# Patient Record
Sex: Female | Born: 1950
Health system: Southern US, Community
[De-identification: ages and names within clinical notes are randomized; demographics above are authoritative.]

## PROBLEM LIST (undated history)

## (undated) DIAGNOSIS — R918 Other nonspecific abnormal finding of lung field: Secondary | ICD-10-CM

## (undated) DIAGNOSIS — K589 Irritable bowel syndrome without diarrhea: Secondary | ICD-10-CM

## (undated) DIAGNOSIS — K219 Gastro-esophageal reflux disease without esophagitis: Secondary | ICD-10-CM

## (undated) DIAGNOSIS — J45909 Unspecified asthma, uncomplicated: Secondary | ICD-10-CM

## (undated) DIAGNOSIS — M8580 Other specified disorders of bone density and structure, unspecified site: Secondary | ICD-10-CM

## (undated) DIAGNOSIS — A310 Pulmonary mycobacterial infection: Secondary | ICD-10-CM

## (undated) DIAGNOSIS — Z862 Personal history of diseases of the blood and blood-forming organs and certain disorders involving the immune mechanism: Secondary | ICD-10-CM

## (undated) DIAGNOSIS — J302 Other seasonal allergic rhinitis: Secondary | ICD-10-CM

## (undated) DIAGNOSIS — J069 Acute upper respiratory infection, unspecified: Secondary | ICD-10-CM

## (undated) HISTORY — DX: Pulmonary mycobacterial infection: A31.0

## (undated) HISTORY — DX: Other specified disorders of bone density and structure, unspecified site: M85.80

## (undated) HISTORY — DX: Acute upper respiratory infection, unspecified: J06.9

## (undated) HISTORY — DX: Other seasonal allergic rhinitis: J30.2

## (undated) HISTORY — PX: BREAST EXCISIONAL BIOPSY: SUR124

## (undated) HISTORY — DX: Other nonspecific abnormal finding of lung field: R91.8

## (undated) HISTORY — DX: Irritable bowel syndrome, unspecified: K58.9

## (undated) HISTORY — DX: Personal history of diseases of the blood and blood-forming organs and certain disorders involving the immune mechanism: Z86.2

## (undated) HISTORY — DX: Gastro-esophageal reflux disease without esophagitis: K21.9

## (undated) HISTORY — DX: Unspecified asthma, uncomplicated: J45.909

---

## 1998-07-05 ENCOUNTER — Other Ambulatory Visit: Admission: RE | Admit: 1998-07-05 | Discharge: 1998-07-05 | Payer: Self-pay | Admitting: Obstetrics and Gynecology

## 1999-01-06 ENCOUNTER — Ambulatory Visit (HOSPITAL_COMMUNITY): Admission: RE | Admit: 1999-01-06 | Discharge: 1999-01-06 | Payer: Self-pay | Admitting: Internal Medicine

## 1999-07-09 ENCOUNTER — Other Ambulatory Visit: Admission: RE | Admit: 1999-07-09 | Discharge: 1999-07-09 | Payer: Self-pay | Admitting: Internal Medicine

## 1999-07-28 ENCOUNTER — Ambulatory Visit (HOSPITAL_COMMUNITY): Admission: RE | Admit: 1999-07-28 | Discharge: 1999-07-28 | Payer: Self-pay | Admitting: Internal Medicine

## 1999-07-28 ENCOUNTER — Encounter: Payer: Self-pay | Admitting: Internal Medicine

## 1999-07-31 ENCOUNTER — Ambulatory Visit (HOSPITAL_COMMUNITY): Admission: RE | Admit: 1999-07-31 | Discharge: 1999-07-31 | Payer: Self-pay | Admitting: Internal Medicine

## 1999-07-31 ENCOUNTER — Encounter: Payer: Self-pay | Admitting: Internal Medicine

## 1999-08-28 ENCOUNTER — Ambulatory Visit (HOSPITAL_COMMUNITY): Admission: RE | Admit: 1999-08-28 | Discharge: 1999-08-28 | Payer: Self-pay | Admitting: Gastroenterology

## 2000-09-09 ENCOUNTER — Other Ambulatory Visit: Admission: RE | Admit: 2000-09-09 | Discharge: 2000-09-09 | Payer: Self-pay | Admitting: *Deleted

## 2000-10-18 ENCOUNTER — Ambulatory Visit (HOSPITAL_COMMUNITY): Admission: RE | Admit: 2000-10-18 | Discharge: 2000-10-18 | Payer: Self-pay | Admitting: Internal Medicine

## 2000-10-18 ENCOUNTER — Encounter: Payer: Self-pay | Admitting: Internal Medicine

## 2000-10-20 ENCOUNTER — Encounter: Admission: RE | Admit: 2000-10-20 | Discharge: 2000-10-20 | Payer: Self-pay | Admitting: Internal Medicine

## 2000-10-20 ENCOUNTER — Encounter: Payer: Self-pay | Admitting: Internal Medicine

## 2002-01-24 ENCOUNTER — Encounter: Payer: Self-pay | Admitting: Internal Medicine

## 2002-01-24 ENCOUNTER — Ambulatory Visit (HOSPITAL_COMMUNITY): Admission: RE | Admit: 2002-01-24 | Discharge: 2002-01-24 | Payer: Self-pay | Admitting: Pediatrics

## 2003-02-21 ENCOUNTER — Ambulatory Visit (HOSPITAL_COMMUNITY): Admission: RE | Admit: 2003-02-21 | Discharge: 2003-02-21 | Payer: Self-pay | Admitting: Internal Medicine

## 2003-02-21 ENCOUNTER — Encounter: Payer: Self-pay | Admitting: Internal Medicine

## 2003-02-23 ENCOUNTER — Encounter: Admission: RE | Admit: 2003-02-23 | Discharge: 2003-02-23 | Payer: Self-pay | Admitting: Internal Medicine

## 2003-02-23 ENCOUNTER — Encounter: Payer: Self-pay | Admitting: Internal Medicine

## 2003-12-04 ENCOUNTER — Other Ambulatory Visit: Admission: RE | Admit: 2003-12-04 | Discharge: 2003-12-04 | Payer: Self-pay | Admitting: Obstetrics and Gynecology

## 2003-12-26 ENCOUNTER — Encounter: Admission: RE | Admit: 2003-12-26 | Discharge: 2003-12-26 | Payer: Self-pay | Admitting: Obstetrics and Gynecology

## 2004-08-27 ENCOUNTER — Encounter: Admission: RE | Admit: 2004-08-27 | Discharge: 2004-08-27 | Payer: Self-pay | Admitting: Obstetrics and Gynecology

## 2005-03-18 ENCOUNTER — Encounter: Admission: RE | Admit: 2005-03-18 | Discharge: 2005-03-18 | Payer: Self-pay | Admitting: Obstetrics and Gynecology

## 2005-08-28 ENCOUNTER — Encounter: Admission: RE | Admit: 2005-08-28 | Discharge: 2005-08-28 | Payer: Self-pay | Admitting: Obstetrics and Gynecology

## 2005-12-14 HISTORY — PX: CARPAL TUNNEL RELEASE: SHX101

## 2006-01-14 HISTORY — PX: BREAST SURGERY: SHX581

## 2006-03-24 ENCOUNTER — Encounter: Admission: RE | Admit: 2006-03-24 | Discharge: 2006-03-24 | Payer: Self-pay | Admitting: Obstetrics and Gynecology

## 2006-04-27 ENCOUNTER — Ambulatory Visit (HOSPITAL_COMMUNITY): Admission: RE | Admit: 2006-04-27 | Discharge: 2006-04-27 | Payer: Self-pay | Admitting: Gastroenterology

## 2006-10-12 ENCOUNTER — Encounter: Admission: RE | Admit: 2006-10-12 | Discharge: 2006-10-12 | Payer: Self-pay | Admitting: Internal Medicine

## 2007-04-18 ENCOUNTER — Encounter: Admission: RE | Admit: 2007-04-18 | Discharge: 2007-04-18 | Payer: Self-pay | Admitting: *Deleted

## 2007-04-26 ENCOUNTER — Encounter: Admission: RE | Admit: 2007-04-26 | Discharge: 2007-04-26 | Payer: Self-pay | Admitting: Obstetrics and Gynecology

## 2007-06-29 ENCOUNTER — Encounter
Admission: RE | Admit: 2007-06-29 | Discharge: 2007-06-29 | Payer: Self-pay | Admitting: Physical Medicine and Rehabilitation

## 2007-10-20 ENCOUNTER — Encounter: Admission: RE | Admit: 2007-10-20 | Discharge: 2007-10-20 | Payer: Self-pay | Admitting: Obstetrics and Gynecology

## 2007-10-28 ENCOUNTER — Ambulatory Visit: Payer: Self-pay | Admitting: Oncology

## 2007-11-03 ENCOUNTER — Encounter: Admission: RE | Admit: 2007-11-03 | Discharge: 2007-11-03 | Payer: Self-pay | Admitting: Obstetrics and Gynecology

## 2007-12-13 ENCOUNTER — Ambulatory Visit: Payer: Self-pay | Admitting: Oncology

## 2008-02-02 ENCOUNTER — Encounter: Admission: RE | Admit: 2008-02-02 | Discharge: 2008-02-02 | Payer: Self-pay | Admitting: Surgery

## 2008-02-03 ENCOUNTER — Encounter: Admission: RE | Admit: 2008-02-03 | Discharge: 2008-02-03 | Payer: Self-pay | Admitting: Surgery

## 2008-02-03 ENCOUNTER — Ambulatory Visit (HOSPITAL_BASED_OUTPATIENT_CLINIC_OR_DEPARTMENT_OTHER): Admission: RE | Admit: 2008-02-03 | Discharge: 2008-02-03 | Payer: Self-pay | Admitting: Surgery

## 2008-02-08 ENCOUNTER — Encounter: Admission: RE | Admit: 2008-02-08 | Discharge: 2008-02-08 | Payer: Self-pay | Admitting: Surgery

## 2008-03-02 ENCOUNTER — Encounter: Admission: RE | Admit: 2008-03-02 | Discharge: 2008-03-02 | Payer: Self-pay | Admitting: Family Medicine

## 2008-03-21 ENCOUNTER — Ambulatory Visit (HOSPITAL_COMMUNITY): Admission: RE | Admit: 2008-03-21 | Discharge: 2008-03-21 | Payer: Self-pay | Admitting: Neurosurgery

## 2008-05-16 ENCOUNTER — Encounter: Admission: RE | Admit: 2008-05-16 | Discharge: 2008-05-16 | Payer: Self-pay | Admitting: Family Medicine

## 2008-05-24 ENCOUNTER — Ambulatory Visit: Payer: Self-pay | Admitting: Internal Medicine

## 2008-05-24 DIAGNOSIS — J82 Pulmonary eosinophilia, not elsewhere classified: Secondary | ICD-10-CM

## 2008-05-24 DIAGNOSIS — J8289 Other pulmonary eosinophilia, not elsewhere classified: Secondary | ICD-10-CM | POA: Insufficient documentation

## 2008-05-24 DIAGNOSIS — R05 Cough: Secondary | ICD-10-CM | POA: Insufficient documentation

## 2008-05-24 DIAGNOSIS — R059 Cough, unspecified: Secondary | ICD-10-CM | POA: Insufficient documentation

## 2008-05-29 ENCOUNTER — Ambulatory Visit: Payer: Self-pay | Admitting: Internal Medicine

## 2008-05-29 ENCOUNTER — Ambulatory Visit: Admission: RE | Admit: 2008-05-29 | Discharge: 2008-05-29 | Payer: Self-pay | Admitting: Internal Medicine

## 2008-06-08 ENCOUNTER — Encounter (INDEPENDENT_AMBULATORY_CARE_PROVIDER_SITE_OTHER): Payer: Self-pay | Admitting: *Deleted

## 2008-06-19 ENCOUNTER — Ambulatory Visit: Payer: Self-pay | Admitting: Internal Medicine

## 2008-07-24 ENCOUNTER — Ambulatory Visit: Payer: Self-pay | Admitting: Internal Medicine

## 2008-09-03 IMAGING — CR DG CHEST 2V
2 series · 2 of 2 positions shown · non-contrast
Comparison: Chest x-ray 02/02/08 and CT 02/08/08.

CLINICAL DATA: Followup pneumonia.   Cough. 
 CHEST ? 2 VIEW:

[w chest pa]
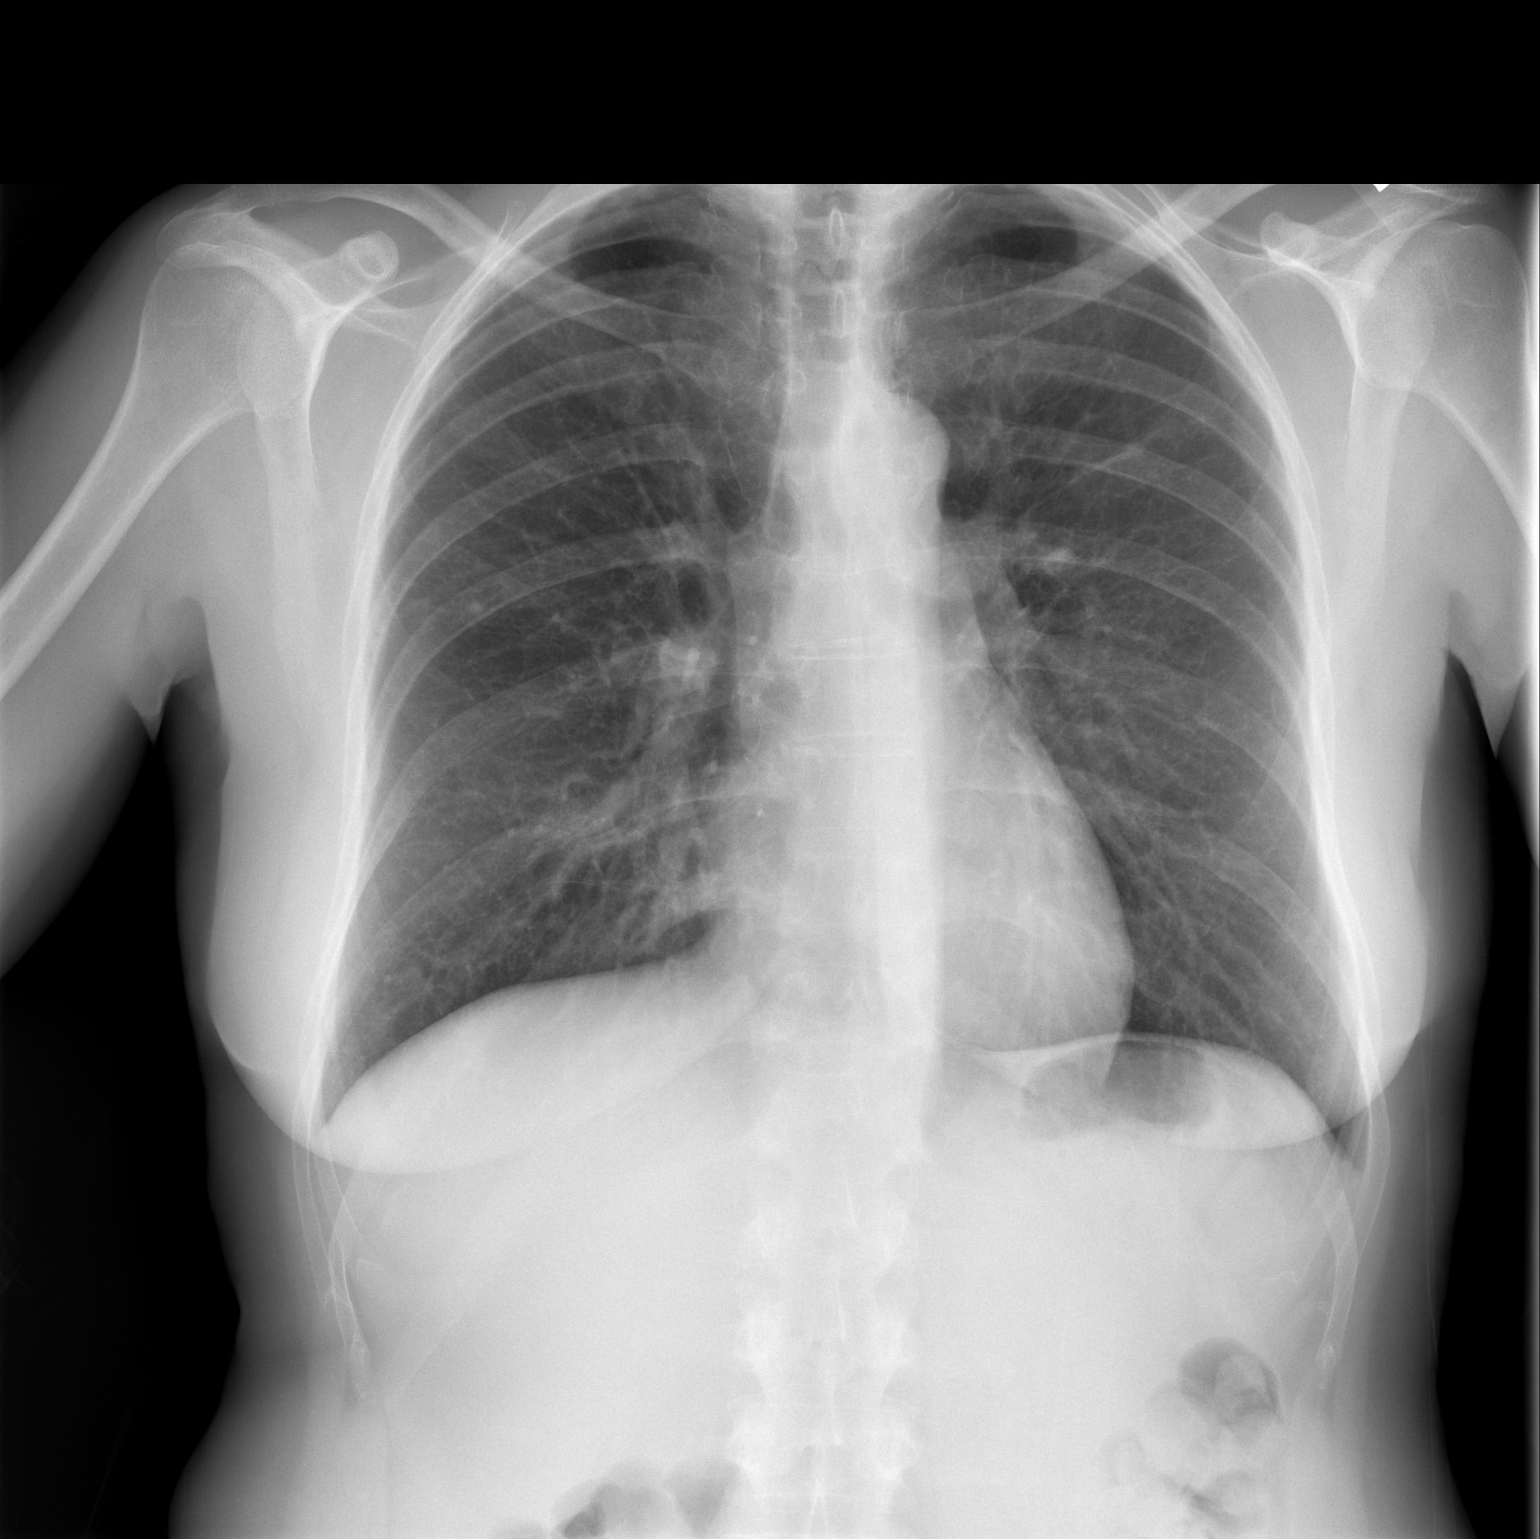

[w chest lat]
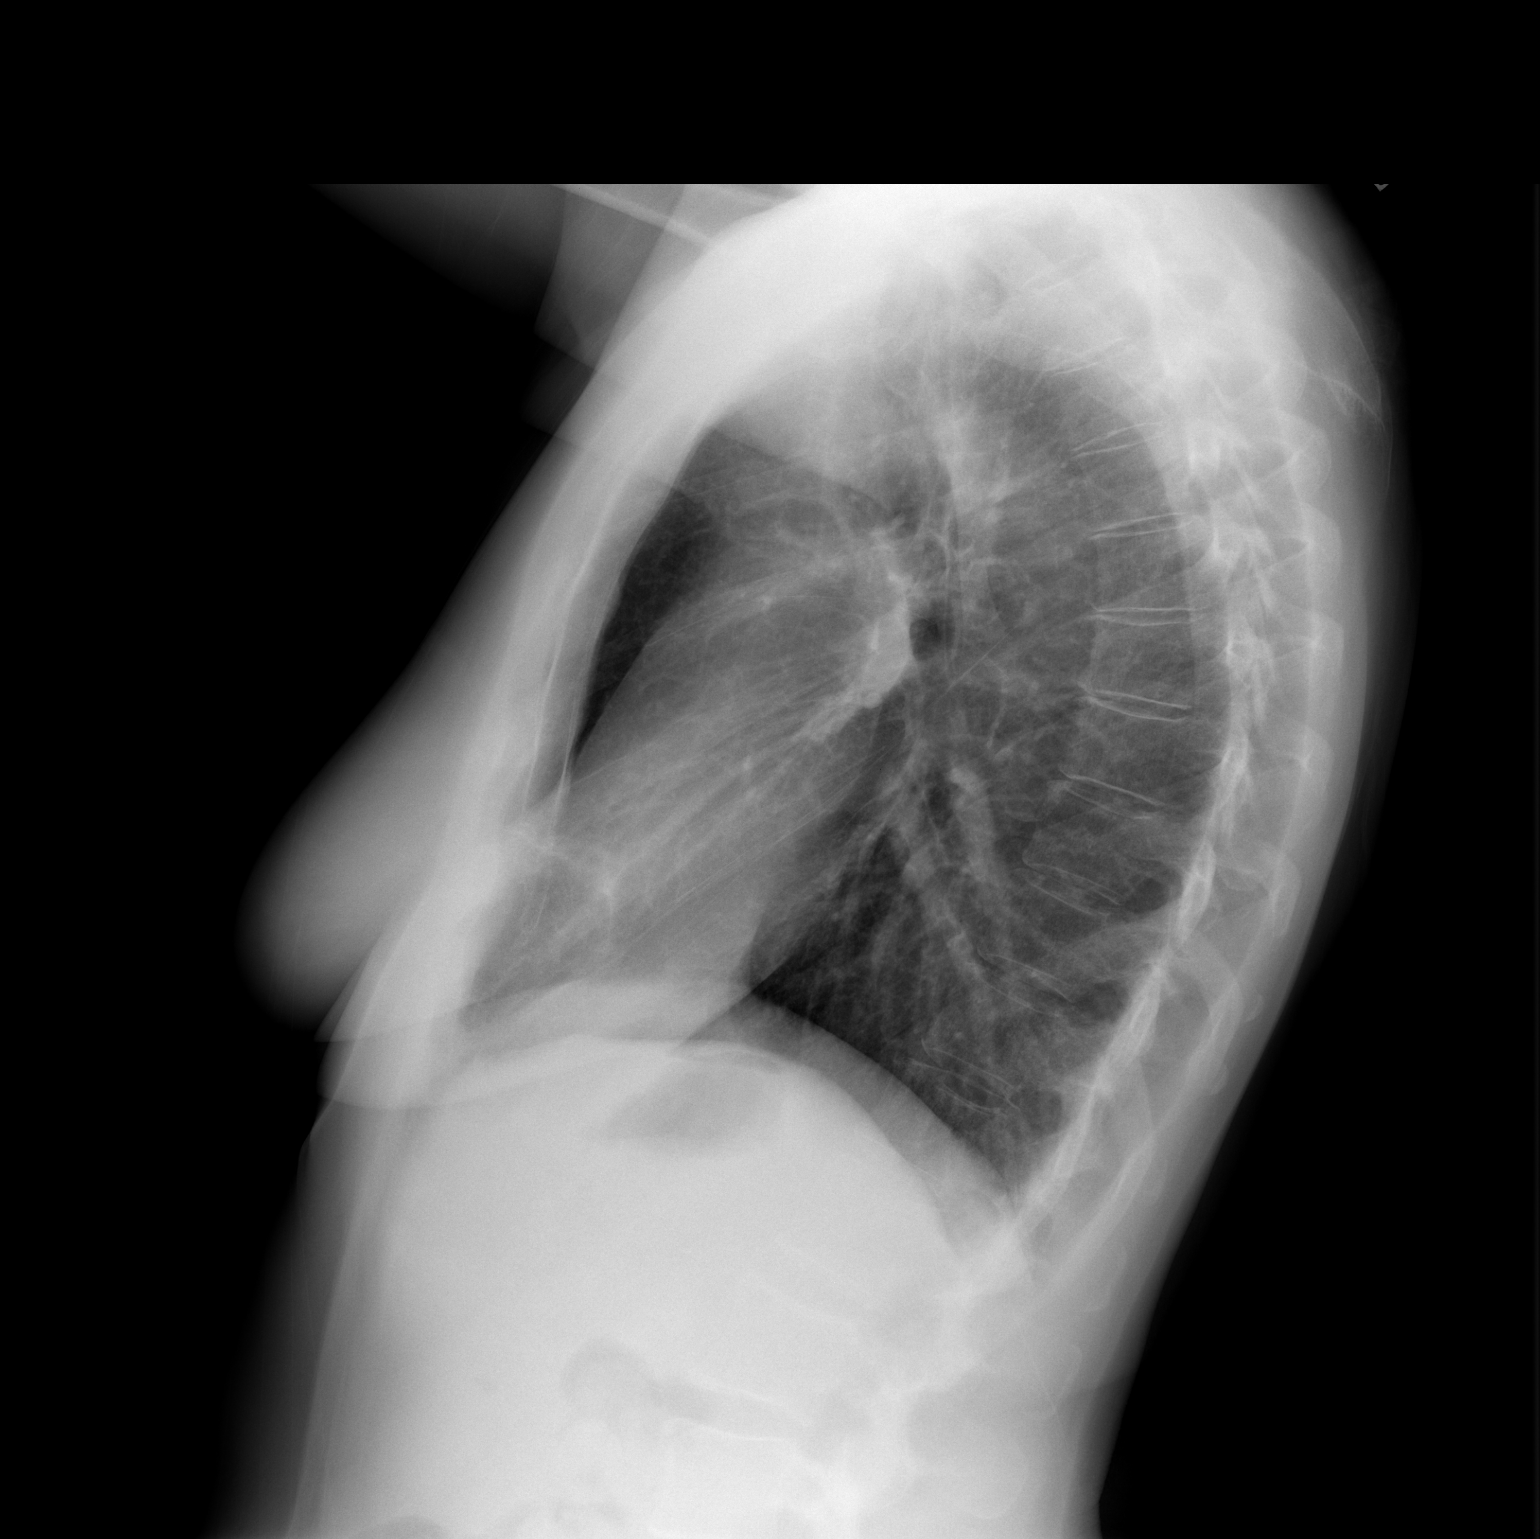

[2 of 2 positions shown; findings below may reference images not displayed]

FINDINGS: Patchy infiltrate in the right middle lobe is unchanged and has not yet resolved.  No new infiltrates are identified.  There is no effusion.  There is chronic lung disease.
IMPRESSION: Right middle lobe infiltrate is unchanged and is most compatible with pneumonia.  Continued followup until clearing is suggested.

## 2008-12-24 ENCOUNTER — Encounter: Admission: RE | Admit: 2008-12-24 | Discharge: 2008-12-24 | Payer: Self-pay | Admitting: Obstetrics and Gynecology

## 2008-12-27 ENCOUNTER — Encounter: Admission: RE | Admit: 2008-12-27 | Discharge: 2008-12-27 | Payer: Self-pay | Admitting: Obstetrics and Gynecology

## 2010-01-01 ENCOUNTER — Encounter: Admission: RE | Admit: 2010-01-01 | Discharge: 2010-01-01 | Payer: Self-pay | Admitting: Obstetrics and Gynecology

## 2010-01-06 ENCOUNTER — Encounter: Admission: RE | Admit: 2010-01-06 | Discharge: 2010-01-06 | Payer: Self-pay | Admitting: Obstetrics and Gynecology

## 2010-08-13 ENCOUNTER — Ambulatory Visit: Payer: Self-pay | Admitting: Internal Medicine

## 2010-11-11 ENCOUNTER — Encounter: Admission: RE | Admit: 2010-11-11 | Discharge: 2010-11-11 | Payer: Self-pay | Admitting: Obstetrics and Gynecology

## 2011-01-13 NOTE — Assessment & Plan Note (Signed)
Summary: Pulmonary/ acute ex ov for chronic cough   Copy to:  Sedonia Small NP Primary Provider/Referring Provider:  Dr. Shaune Pollack  CC:  Acute visit.  Pt c/o worsening cough over the past 6 months- gradually getting worse. Cough is non prod and gets worse at night when lies down and in the early am.  No other complaints today.Marland Kitchen  History of Present Illness: 35 yow quit smoking  age 60 after 14 years with a cough onset winter of 09 and a incidental  cxr done for pre op reasons   2/09   with worsening  rml process assoc with non prod cough  feels congestion worse in am  only after stirs.    Seen on 05/24/08  taking prilosec two times a day with persistent abn cxr so underwent fob on 05/29/08 with nl airways but BAL was 2+  and grew out MAI.   August 13, 2010  Acute visit.  Pt c/o worsening cough over the past 6 months- gradually getting worse. Cough is non prod and gets worse at night when lies down and in the early am.  No other complaints today. no folowing recs with ppi.  Pt denies any significant sore throat, dysphagia, itching, sneezing,  nasal congestion or excess secretions,  fever, chills, sweats, unintended wt loss, pleuritic or exertional cp, hempoptysis, change in activity tolerance  orthopnea pnd or leg swelling.  Pt also denies any obvious fluctuation in symptoms with weather or environmental change or other alleviating or aggravating factors.                  Current Medications (verified): 1)  Prilosec Otc 20 Mg  Tbec (Omeprazole Magnesium) .Marland Kitchen.. 1 Every Evening 2)  Claritin 10 Mg  Tabs (Loratadine) .... Once Daily As Needed 3)  Calcium Antacid Ultra Strength 1000 Mg Chew (Calcium Carbonate Antacid) .Marland Kitchen.. 1 Once Daily 4)  D 1000 Plus  Tabs (Fa-Cyanocobalamin-B6-D-Ca) .Marland Kitchen.. 1 Once Daily 5)  Vitamin C 500 Mg Tabs (Ascorbic Acid) .Marland Kitchen.. 1 Once Daily 6)  Aspirin Low Dose 81 Mg Tabs (Aspirin) .Marland Kitchen.. 1 Once Daily  Allergies (verified): 1)  ! Pcn  Past History:  Past Medical  History: GERD clinical dx Abn cxr      - FOB  05/29/08 nl with MAI 2+   Vital Signs:  Patient profile:   60 year old female Height:      65 inches Weight:      145.38 pounds BMI:     24.28 O2 Sat:      98 % on Room air Temp:     97.7 degrees F oral Pulse rate:   69 / minute BP sitting:   110 / 80  (left arm)  Vitals Entered By: Vernie Murders (August 13, 2010 3:36 PM)  O2 Flow:  Room air  Physical Exam  Additional Exam:  Ambulatory healthy appearing in no acute distress. 144 > 145  August 13, 2010  HEENT: nl dentition, turbinates, and orophanx. Nl external ear canals without cough reflex Neck without JVD/Nodes/TM Lungs clear to A and P bilaterally without cough on insp or exp maneuvers RRR no s3 or murmur or increase in P2 Abd soft and benign with nl excursion in the supine position. No bruits or organomegaly Ext warm without calf tenderness, cyanosis clubbing or edema Skin warm and dry without lesions     CXR  Procedure date:  08/13/2010  Findings:      Stable cardiopulmonary appearance with chronic changes noted in  the right midlung zone and medial aspect of the right lower lobe correlating with areas of bronchiectasis on CT from 2009.  No new focal or acute abnormality suggested.    Impression & Recommendations:  Problem # 1:  COUGH (ICD-786.2) The most common causes of chronic cough in immunocompetent adults include: upper airway cough syndrome (UACS), previously referred to as postnasal drip syndrome,  caused by variety of rhinosinus conditions; (2) asthma; (3) GERD; (4) chronic bronchitis from cigarette smoking or other inhaled environmental irritants; (5) nonasthmatic eosinophilic bronchitis; and (6) bronchiectasis. These conditions, singly or in combination, have accounted for up to 94% of the causes of chronic cough in prospective studies.  this is most c/w  Classic Upper airway cough syndrome, so named because it's frequently impossible to sort out how much  is  CR/sinusitis with freq throat clearing (which can be related to primary GERD)   vs  causing  secondary extra esophageal GERD from wide swings in gastric pressure that occur with throat clearing, promoting self use of mint and menthol lozenges that reduce the lower esophageal sphincter tone and exacerbate the problem further These are the same pts who not infrequently have failed to tolerate ace inhibitors,  dry powder inhalers or biphosphonates or report having reflux symptoms that don't respond to standard doses of PPI  for now rx as gerd with next step sinus cta  Problem # 2:  PULMONARY INFILTRATE INCLUDES (EOSINOPHILIA) (ICD-518.3) No change on cxr on pattern of cough which is not typical of MAI/ bronchiectasis  Medications Added to Medication List This Visit: 1)  Prilosec Otc 20 Mg Tbec (Omeprazole magnesium) .Marland Kitchen.. 1 every evening 2)  Prilosec Otc 20 Mg Tbec (Omeprazole magnesium) .... Take  one 30-60 min before first meal of the day 3)  Calcium Antacid Ultra Strength 1000 Mg Chew (Calcium carbonate antacid) .Marland Kitchen.. 1 once daily 4)  D 1000 Plus Tabs (Fa-cyanocobalamin-b6-d-ca) .Marland Kitchen.. 1 once daily 5)  Vitamin C 500 Mg Tabs (Ascorbic acid) .Marland Kitchen.. 1 once daily 6)  Aspirin Low Dose 81 Mg Tabs (Aspirin) .Marland Kitchen.. 1 once daily 7)  Pepcid Ac Maximum Strength 20 Mg Tabs (Famotidine) .... One at bedtime 8)  Delsym 30 Mg/25ml Lqcr (Dextromethorphan polistirex) .... 2 tsp every 12 hours as needed  Other Orders: Est. Patient Level IV (99214) T-2 View CXR (71020TC)  Patient Instructions: 1)  GERD (REFLUX)  is a common cause of respiratory symptoms. It commonly presents without heartburn and can be treated with medication, but also with lifestyle changes including avoidance of late meals, excessive alcohol, smoking cessation, and avoid fatty foods, chocolate, peppermint, colas, red wine, and acidic juices such as orange juice. NO MINT OR MENTHOL PRODUCTS SO NO COUGH DROPS  2)  USE SUGARLESS CANDY INSTEAD (jolley  ranchers)  3)  NO OIL BASED VITAMINS  4)  Prilosec 20 mg Take  one 30-60 min before first meal of the day and pepcid 20 mg one at bedtime 5)  Delsym as needed  6)  Call if not better Oct 1 for follow up 7)  NB the  ramp to expected improvement (and for that matter, worsening, if a chronic effective medication is stopped)  can be measured in weeks, not days, a common misconception because this is not Heartburn with no immediate cause and effect relationship so that response to therapy or lack thereof can be very difficult to assess.

## 2011-01-22 ENCOUNTER — Other Ambulatory Visit: Payer: Self-pay | Admitting: Family Medicine

## 2011-01-22 DIAGNOSIS — Z1231 Encounter for screening mammogram for malignant neoplasm of breast: Secondary | ICD-10-CM

## 2011-02-03 ENCOUNTER — Ambulatory Visit
Admission: RE | Admit: 2011-02-03 | Discharge: 2011-02-03 | Disposition: A | Discharge status: Home / Self Care | Payer: BC Managed Care – PPO | Source: Ambulatory Visit | Attending: Family Medicine | Admitting: Family Medicine

## 2011-02-03 DIAGNOSIS — Z1231 Encounter for screening mammogram for malignant neoplasm of breast: Secondary | ICD-10-CM

## 2011-04-28 NOTE — Op Note (Signed)
NAMEROSEMARIA, INABINET                ACCOUNT NO.:  [id number]   MEDICAL RECORD NO.:  [id number]          PATIENT TYPE:  AMB   LOCATION:  SDS                          FACILITY:  [facility]   PHYSICIAN:  Payton Doughty, M.D.      DATE OF BIRTH:  15-Nov-1951   DATE OF PROCEDURE:  03/21/2008  DATE OF DISCHARGE:  03/21/2008                               OPERATIVE REPORT   PREOPERATIVE DIAGNOSIS:  Right carpal tunnel syndrome.   POSTOPERATIVE DIAGNOSIS:  Right carpal tunnel syndrome.   OPERATIVE PROCEDURE:  Right carpal tunnel release.   SURGEON:  Payton Doughty, M.D.   ANESTHESIA:  Local lidocaine 1%.   COMPLICATIONS:  None.   ASSISTANT:  No assistant.    This is a 60 year old girl with right carpal tunnel syndrome, taken to  the operating room.  Following shave, prepped and draped in usual  sterile fashion and 1% lidocaine injected over the operative site  infiltrating the skin and subcutaneous tissues on the line between the  second and third rays on the hand.  This was done 1 cm distal to  proximal wrist crease.  A skin incision was made subcutaneous and palmar  fat was dissected and transverse carpal ligament identified and divided.  The nerves identified and the ligament was divided down on to the wrist  and up on to the hands, so that the nerve was completely free.  The  ligament was somewhat thickened and very tight.  The wound was irrigated  and hemostasis assured, and closed in a single layer of 3-0 nylon in an  interrupted vertical mattress fashion.  Betadine, Telfa dressing, and a  fluffy hand wrap applied.  The patient returned to recovery room in good  condition.            ______________________________  Payton Doughty, M.D.     MWR/MEDQ  D:  03/21/2008  T:  03/22/2008  Job:  [id number]

## 2011-04-28 NOTE — Op Note (Signed)
Kim Vasquez, Kim Vasquez                ACCOUNT NO.:  [id number]   MEDICAL RECORD NO.:  [id number]          PATIENT TYPE:  AMB   LOCATION:  DSC                          FACILITY:  [facility]   PHYSICIAN:  Thomas A. Cornett, M.D.DATE OF BIRTH:  07-12-1951   DATE OF PROCEDURE:  02/03/2008  DATE OF DISCHARGE:                               OPERATIVE REPORT   PREOPERATIVE DIAGNOSIS:  Left breast atypical ductal hyperplasia.   POSTOPERATIVE DIAGNOSIS:  Left breast atypical ductal hyperplasia.   PROCEDURE:  Left breast needle-localized excisional biopsy.   SURGEON:  Harriette Bouillon, M.D.   ANESTHESIA:  MAC with 2.25% Sensorcaine.   ESTIMATED BLOOD LOSS:  20 mL.   SPECIMEN:  Left breast tissue with previously placed clip from biopsy  and specimen verified by radiography with intact wire.   INDICATIONS FOR PROCEDURE:  The patient is a 60 year old postmenopausal  female found to have a cluster of left breast microcalcifications.  Biopsy proves this to be atypical ductal hyperplasia.  She has a very  strong family history of breast cancer, and we did a genetic workup  which was negative for BRCA 1 and 2 mutations.  She presents today for  left breast needle-localized excisional biopsy of this area.   DESCRIPTION OF PROCEDURE:  The patient was brought to the operating room  after undergoing left breast needle localization in the radiology suite.  After induction of MAC anesthesia, the left breast was prepped and  draped in a sterile fashion.  The wire was trimmed.  Local anesthesia  was injected consisting of 0.25% Sensorcaine with epinephrine in the  left upper outer quadrant laterally.  Curvilinear incision was made  around the wire.  Wire brought out the incision.  All the tissue around  the wire was excised in its entirety.  Specimen radiograph was done in  the operating room which showed the clip to be present as well as the  intact wire.  This was sent to pathology for further evaluation.   The  wound was irrigated.  Hemostasis was achieved, and this was closed in  layers using a deep layer of 3-0 Vicryl and 4-0 Monocryl for a  subcuticular stitch.  Dermabond was applied.  All final counts of  sponge, needle and instruments were found to be correct at this portion  of the case.  The patient was then awoke, taken to recovery in  satisfactory condition.     Thomas A. Cornett, M.D.  Electronically Signed    TAC/MEDQ  D:  02/03/2008  T:  02/04/2008  Job:  [id number]   cc:   Josephine Igo, M.D.

## 2011-04-28 NOTE — Op Note (Signed)
[name], HADSALL                ACCOUNT NO.:  [id number]   MEDICAL RECORD NO.:  [id number]          PATIENT TYPE:  AMB   LOCATION:  CARD                         FACILITY:  [facility]   PHYSICIAN:  Casimiro Needle B. Sherene Sires, MD, FCCPDATE OF BIRTH:  04-Jun-1951   DATE OF PROCEDURE:  05/29/2008  DATE OF DISCHARGE:                               OPERATIVE REPORT   PROCEDURE:  Fiberoptic bronchoscope with a lavage of the right middle  lobe.   The patient was referred by [facility].   HISTORY AND INDICATIONS:  Please see attached pulmonary consultation.  The patient has a persistent density in the right middle lobe which most  likely corresponds to a streaky area of atelectasis or scarring, but  bronchoscopy was indicated to rule out a right middle lobe syndrome and  to obtain sampling for cytology, AFB and fungal stain and culture.  The  patient agreed to procedure after a full discussion of the risks,  benefits and alternatives.   DESCRIPTION OF PROCEDURE:  The procedure was performed in the  bronchoscopy suite with continuous monitoring by surface ECG and  oximetry, and the patient maintained adequate saturations throughout the  procedure on nasal prongs.   She received a total of 5 mg of Versed and 50 gm of IV Demerol for  adequate sedation and cough suppression.   The left naris was easily cannulated using a standard flexible  Fiberoptic bronchoscope, then the entire upper airway was inspected.  The cords moved normally and there were no obvious upper airway lesions  to my review.  Using additional 1% lidocaine as needed, the entire  tracheobronchial tree was explored with the following findings.  The  trachea and all the major bronchi to the subsegmental level were normal  bilaterally, including the right middle lobe which was inspected twice.  There were no areas of focal abnormality.  Therefore, the medial segment  of the right middle lobe was lavaged vigorously with  adequate return and  sent for cytology, AFB and fungal stain and culture, as well as routine  stain and culture.   The patient tolerated the procedure well and follow-up will be arranged  as an outpatient.   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  No  evidence of endobronchial obstruction.  Serial follow-up will be  arranged as an outpatient along with evaluation of the above studies  when available.      [name]. Sherene Sires, MD, [facility]  Electronically Signed     MBW/MEDQ  D:  05/29/2008  T:  05/30/2008  Job:  [id number]   cc:   [facility]

## 2011-09-04 LAB — CBC
HCT: 37.7
Hemoglobin: 12.9
MCHC: 34.3
MCV: 90.4
Platelets: 275
RBC: 4.18
RDW: 11.8
WBC: 6

## 2011-09-04 LAB — DIFFERENTIAL
Basophils Absolute: 0
Basophils Relative: 0
Eosinophils Absolute: 0.1
Eosinophils Relative: 2
Lymphocytes Relative: 23
Lymphs Abs: 1.4
Monocytes Absolute: 0.4
Monocytes Relative: 7
Neutro Abs: 4.1
Neutrophils Relative %: 67

## 2011-09-04 LAB — BASIC METABOLIC PANEL
BUN: 13
CO2: 29
Calcium: 9.2
Chloride: 100
Creatinine, Ser: 0.87
GFR calc Af Amer: >60
GFR calc non Af Amer: >60
Glucose, Bld: 120 — ABNORMAL HIGH
Potassium: 4.9
Sodium: 137

## 2011-09-08 LAB — URINALYSIS, ROUTINE W REFLEX MICROSCOPIC
Bilirubin Urine: NEGATIVE
Glucose, UA: NEGATIVE
Hgb urine dipstick: NEGATIVE
Ketones, ur: NEGATIVE
Nitrite: NEGATIVE
Protein, ur: NEGATIVE
Specific Gravity, Urine: 1.009
Urobilinogen, UA: 0.2
pH: 7.5

## 2011-09-08 LAB — DIFFERENTIAL
Basophils Absolute: 0
Basophils Relative: 1
Eosinophils Absolute: 0.1
Eosinophils Relative: 1
Lymphocytes Relative: 27
Lymphs Abs: 1.5
Monocytes Absolute: 0.5
Monocytes Relative: 9
Neutro Abs: 3.6
Neutrophils Relative %: 62

## 2011-09-08 LAB — COMPREHENSIVE METABOLIC PANEL
ALT: 18
AST: 22
Albumin: 4.3
Alkaline Phosphatase: 59
BUN: 10
CO2: 32
Calcium: 9.7
Chloride: 97
Creatinine, Ser: 0.69
GFR calc Af Amer: >60
GFR calc non Af Amer: >60
Glucose, Bld: 85
Potassium: 4.3
Sodium: 136
Total Bilirubin: 0.9
Total Protein: 7

## 2011-09-08 LAB — APTT: aPTT: 31

## 2011-09-08 LAB — CBC
HCT: 38.2
Hemoglobin: 13.4
MCHC: 35.2
MCV: 90
Platelets: 282
RBC: 4.25
RDW: 11.6
WBC: 5.7

## 2011-09-08 LAB — PROTIME-INR
INR: 0.9
Prothrombin Time: 12.3

## 2011-09-10 LAB — CULTURE, RESPIRATORY

## 2011-09-10 LAB — AFB CULTURE WITH SMEAR (NOT AT ARMC)

## 2011-09-10 LAB — FUNGUS CULTURE W SMEAR: Fungal Smear: NONE SEEN

## 2011-09-10 LAB — CULTURE, RESPIRATORY W GRAM STAIN

## 2012-02-01 ENCOUNTER — Other Ambulatory Visit: Payer: Self-pay | Admitting: Family Medicine

## 2012-02-01 DIAGNOSIS — R102 Pelvic and perineal pain: Secondary | ICD-10-CM

## 2012-02-02 ENCOUNTER — Ambulatory Visit
Admission: RE | Admit: 2012-02-02 | Discharge: 2012-02-02 | Disposition: A | Discharge status: Home / Self Care | Payer: BC Managed Care – PPO | Source: Ambulatory Visit | Attending: Family Medicine | Admitting: Family Medicine

## 2012-02-02 DIAGNOSIS — R102 Pelvic and perineal pain: Secondary | ICD-10-CM

## 2012-02-25 ENCOUNTER — Other Ambulatory Visit: Payer: Self-pay | Admitting: Gastroenterology

## 2012-02-26 ENCOUNTER — Other Ambulatory Visit: Payer: Self-pay | Admitting: Obstetrics & Gynecology

## 2012-02-26 DIAGNOSIS — Z1231 Encounter for screening mammogram for malignant neoplasm of breast: Secondary | ICD-10-CM

## 2012-03-15 ENCOUNTER — Ambulatory Visit
Admission: RE | Admit: 2012-03-15 | Discharge: 2012-03-15 | Disposition: A | Discharge status: Home / Self Care | Payer: BC Managed Care – PPO | Source: Ambulatory Visit | Attending: Obstetrics & Gynecology | Admitting: Obstetrics & Gynecology

## 2012-03-15 DIAGNOSIS — Z1231 Encounter for screening mammogram for malignant neoplasm of breast: Secondary | ICD-10-CM

## 2012-03-17 ENCOUNTER — Other Ambulatory Visit: Payer: Self-pay | Admitting: Obstetrics & Gynecology

## 2012-03-17 DIAGNOSIS — R928 Other abnormal and inconclusive findings on diagnostic imaging of breast: Secondary | ICD-10-CM

## 2012-03-18 ENCOUNTER — Ambulatory Visit
Admission: RE | Admit: 2012-03-18 | Discharge: 2012-03-18 | Disposition: A | Discharge status: Home / Self Care | Payer: BC Managed Care – PPO | Source: Ambulatory Visit | Attending: Obstetrics & Gynecology | Admitting: Obstetrics & Gynecology

## 2012-03-18 DIAGNOSIS — R928 Other abnormal and inconclusive findings on diagnostic imaging of breast: Secondary | ICD-10-CM

## 2012-05-18 ENCOUNTER — Other Ambulatory Visit: Payer: Self-pay | Admitting: Obstetrics & Gynecology

## 2012-05-18 DIAGNOSIS — N6320 Unspecified lump in the left breast, unspecified quadrant: Secondary | ICD-10-CM

## 2012-05-25 ENCOUNTER — Ambulatory Visit
Admission: RE | Admit: 2012-05-25 | Discharge: 2012-05-25 | Disposition: A | Discharge status: Home / Self Care | Payer: BC Managed Care – PPO | Source: Ambulatory Visit | Attending: Obstetrics & Gynecology | Admitting: Obstetrics & Gynecology

## 2012-05-25 DIAGNOSIS — N6320 Unspecified lump in the left breast, unspecified quadrant: Secondary | ICD-10-CM

## 2012-12-19 ENCOUNTER — Ambulatory Visit (INDEPENDENT_AMBULATORY_CARE_PROVIDER_SITE_OTHER): Payer: BC Managed Care – PPO | Admitting: Pulmonary Disease

## 2012-12-19 ENCOUNTER — Ambulatory Visit (INDEPENDENT_AMBULATORY_CARE_PROVIDER_SITE_OTHER)
Admission: RE | Admit: 2012-12-19 | Discharge: 2012-12-19 | Disposition: A | Discharge status: Home / Self Care | Payer: BC Managed Care – PPO | Source: Ambulatory Visit | Attending: Pulmonary Disease | Admitting: Pulmonary Disease

## 2012-12-19 ENCOUNTER — Encounter: Payer: Self-pay | Admitting: *Deleted

## 2012-12-19 VITALS — BP 118/78 | HR 79 | Temp 97.3°F | Ht 64.5 in | Wt 137.0 lb

## 2012-12-19 DIAGNOSIS — R042 Hemoptysis: Secondary | ICD-10-CM

## 2012-12-19 DIAGNOSIS — A310 Pulmonary mycobacterial infection: Secondary | ICD-10-CM

## 2012-12-19 NOTE — Patient Instructions (Signed)
Will schedule CT chest and call with results Follow up in 4 weeks 

## 2012-12-19 NOTE — Progress Notes (Signed)
Chief Complaint  Patient presents with  . acute visit    saw MW 07/2010. pt c/o coughing up blood on 12/11/12. 1 tsp at a time-coughing fits at a time. has not coughed up any blood since. she has been taking delsym at bedtime and occasionally in the morning.     History of Present Illness: Kim Vasquez is a 62 y.o. female former smoker for evaluation of hemoptysis.  She was seen by Dr. Sherene Sires in 2009 and 2011 for pulmonary infiltrates.  She had bronchoscopy in 2009 which showed MAI, Penicillium, and Aspergillus [place].  She had episode of cough with blood on December 29.  She coughed up about 1/2 dixie cup of blood during the course of that day.  She has not had hemoptysis since.  She usually has occasional dry cough.  She gets occasional sinus congestion.  She denies recent nose bleeds.  She denies fever, sweats, chills, skin rash, weight loss, or gland swelling.  She has not had chest pain, wheeze, or dyspnea with exertion.  She denies recent sick exposures.  She quit smoking years ago.  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  She has not been on antibiotics or prednisone recently.  She has not had recent chest xray.  She is not using any inhalers.  She has been using delsym to help suppress her cough.  Tests: 02/08/08 CT chest >> RML tree in bud, mild BTX RML 05/29/08 Bronchoscopy >> scarring RML, cytology negative; MAI, Penicillium, and Aspergillus [place] in BAL  Past Medical History  Diagnosis Date  . GERD (gastroesophageal reflux disease)   . Cough   . Pulmonary infiltrate   . Seasonal allergies     Past Surgical History  Procedure Date  . Wrist surgery     No current outpatient prescriptions on file prior to visit.    Allergies  Allergen Reactions  . Bacitracin   . Erythromycin   . Neosporin (Neomycin-Bacitracin Zn-Polymyx)   . Penicillins   . Polysporin (Bacitracin-Polymyxin B)     Family History  Problem Relation Age of Onset  .  Lymphoma Mother   . Breast cancer Sister   . Colon cancer Maternal Aunt   . Heart disease      maternal grandparents    History  Substance Use Topics  . Smoking status: Former Smoker -- 1.0 packs/day for 14 years    Types: Cigarettes    Quit date: 12/14/1981  . Smokeless tobacco: Not on file  . Alcohol Use: Yes     Comment: 2 per day    Review of Systems  Constitutional: Negative for appetite change and unexpected weight change.  HENT: Positive for congestion and dental problem. Negative for ear pain, sore throat, sneezing and trouble swallowing.   Respiratory: Positive for cough and shortness of breath.   Cardiovascular: Negative for chest pain, palpitations and leg swelling.  Gastrointestinal: Negative for abdominal pain.  Musculoskeletal: Negative for joint swelling.  Skin: Negative for rash.  Neurological: Negative for headaches.  Psychiatric/Behavioral: Negative for dysphoric mood. The patient is not nervous/anxious.    Physical Exam: Filed Vitals:   12/19/12 1404 12/19/12 1406  BP:  118/78  Pulse:  79  Temp: 97.3 F (36.3 C)   TempSrc: Oral   Height: 5' 4.5" (1.638 m)   Weight: 137 lb (62.143 kg)   SpO2:  99%    Current Encounter SPO2  12/19/12 1406 99%  08/13/10 1522 98%  06/19/08 1616 99%  Wt Readings from Last 3 Encounters:  12/19/12 137 lb (62.143 kg)  08/13/10 145 lb 6.1 oz (65.945 kg)  06/19/08 144 lb (65.318 kg)    Body mass index is 23.15 kg/(m^2).   General - No distress ENT - No sinus tenderness, no oral exudate, no LAN, no thyromegaly, TM clear, pupils equal/reactive Cardiac - s1s2 regular, no murmur, pulses symmetric Chest - No wheeze/rales/dullness, good air entry, normal respiratory excursion Back - No focal tenderness Abd - Soft, non-tender, no organomegaly, + bowel sounds Ext - No edema Neuro - Normal strength, cranial nerves intact Skin - No rashes Psych - Normal mood, and behavior.   Lab Results  Component Value Date    WBC 5.7 03/15/2008   HGB 13.4 03/15/2008   HCT 38.2 03/15/2008   MCV 90.0 03/15/2008   PLT 282 03/15/2008    Lab Results  Component Value Date   CREATININE 0.69 03/15/2008   BUN 10 03/15/2008   NA 136 03/15/2008   K 4.3 03/15/2008   CL 97 03/15/2008   CO2 32 03/15/2008    Lab Results  Component Value Date   ALT 18 03/15/2008   AST 22 03/15/2008   ALKPHOS 59 03/15/2008   BILITOT 0.9 03/15/2008    Dg Chest 2 View  12/19/2012  *RADIOLOGY REPORT*  Clinical Data: Hemoptysis  CHEST - 2 VIEW  Comparison: Chest x-ray of 08/13/2010  Findings: No active infiltrate or effusion is seen.  Opacity in the right mid lung field on the frontal view appears stable compared to the prior chest x-ray and is consistent with scarring.  Mediastinal contours are stable.  The heart is within normal limits in size. No bony abnormality is seen.  IMPRESSION: Probable  scarring in the right mid lung possibly involving the right middle lobe.  No active process.   Original Report Authenticated By: Dwyane Dee, M.D.     Assessment/Plan:  Kim Helling, MD [place] [facility] Pager:  [phone] 12/19/2012, 3:08 PM

## 2012-12-19 NOTE — Assessment & Plan Note (Signed)
She had bronchoscopy from 2009 with MAI in BAL.  Will determine if this needs to be further assessed after review of her CT chest.

## 2012-12-19 NOTE — Assessment & Plan Note (Signed)
She has prior history of MAI.  It is uncertain how much effect this was having on her lung function.  She had one episode of hemoptysis in December.  She has not had recurrence.  Her chest xray today is re-assuring.  I think she needs to have a CT chest with contrast to better assess her lung parenchyma and mediastinum.  Depending on results will determine if additional interventions (ie repeat bronchoscopy) are needed.  She is to continue delsym as needed for cough suppression.  She does not have clinical or radiographic findings to suggest a bacterial infection, and will therefore defer antibiotics for now.

## 2012-12-19 NOTE — Progress Notes (Deleted)
  Subjective:    Patient ID: Kim Vasquez, female    DOB: 1951-11-08, 62 y.o.   MRN: [id number]  HPI    Review of Systems  Constitutional: Negative for appetite change and unexpected weight change.  HENT: Positive for congestion and dental problem. Negative for ear pain, sore throat, sneezing and trouble swallowing.   Respiratory: Positive for cough and shortness of breath.   Cardiovascular: Negative for chest pain, palpitations and leg swelling.  Gastrointestinal: Negative for abdominal pain.  Musculoskeletal: Negative for joint swelling.  Skin: Negative for rash.  Neurological: Negative for headaches.  Psychiatric/Behavioral: Negative for dysphoric mood. The patient is not nervous/anxious.        Objective:   Physical Exam        Assessment & Plan:

## 2012-12-22 ENCOUNTER — Inpatient Hospital Stay: Admission: RE | Admit: 2012-12-22 | Payer: BC Managed Care – PPO | Source: Ambulatory Visit

## 2012-12-22 ENCOUNTER — Ambulatory Visit (INDEPENDENT_AMBULATORY_CARE_PROVIDER_SITE_OTHER)
Admission: RE | Admit: 2012-12-22 | Discharge: 2012-12-22 | Disposition: A | Discharge status: Home / Self Care | Payer: BC Managed Care – PPO | Source: Ambulatory Visit | Attending: Pulmonary Disease | Admitting: Pulmonary Disease

## 2012-12-22 DIAGNOSIS — R042 Hemoptysis: Secondary | ICD-10-CM

## 2012-12-22 MED ORDER — IOHEXOL 300 MG/ML  SOLN
80.0000 mL | Freq: Once | INTRAMUSCULAR | Status: AC | PRN
  Administered 2012-12-22: 80 mL via INTRAVENOUS

## 2012-12-23 ENCOUNTER — Telehealth: Payer: Self-pay | Admitting: Pulmonary Disease

## 2012-12-23 NOTE — Telephone Encounter (Signed)
Ct Chest W Contrast  12/22/2012  *RADIOLOGY REPORT*   Clinical Data: Hemoptysis, history of MAI, chronic cough.   CT CHEST WITH CONTRAST   Technique:  Multidetector CT imaging of the chest was performed following the standard protocol during bolus administration of intravenous contrast.   Contrast: 80mL OMNIPAQUE IOHEXOL 300 MG/ML  SOLN   Comparison: Chest radiograph 12/19/2012, 02/08/2008 chest CT   Findings: Incidental hypodense right thyroid nodule is stable, likely benign.  Great vessels are normal in caliber.  No pericardial or pleural effusion.  Heart size is normal.  Great vessels are normal in caliber.  No lymphadenopathy.  Base of right upper lobe and right middle lobe patchy tree in bud type nodular airspace opacities with areas of bronchial wall thickening and distal inspissated secretions are again noted, increased.  No dominant pulmonary nodule, mass, or other consolidation.  Central airways are patent.  Incomplete imaging of the upper abdomen redemonstrates a normal appearance to visualized portions of the adrenal glands.  High-resolution CT images confirm the above findings.  No areas of air trapping or other evidence for interstitial lung disease.  T12 superior endplate Schmorl's node again noted.  No new osseous abnormality.   IMPRESSION: Predominately right upper and middle lobe tree in bud type nodular airspace opacities, bronchial wall thickening, and distal areas of inspissated secretions, consistent with small airways infection. Findings are increased since the prior exam 02/08/2008.    Original Report Authenticated By: Christiana Pellant, M.D.    Results d/w pt.  Have arranged for bronchoscopy on Wednesday 12/28/12 to be done at 9 am at [facility].  Procedure explained to pt.  Risks detailed as bleeding, infection, pneumothorax, and non-diagnosis.  Advised pt to be NPO after midnight, arrange for transportation to/from hospital, and present to [facility] 1 hour before scheduled procedure time.

## 2012-12-26 ENCOUNTER — Inpatient Hospital Stay: Admission: RE | Admit: 2012-12-26 | Payer: BC Managed Care – PPO | Source: Ambulatory Visit

## 2012-12-27 ENCOUNTER — Encounter (HOSPITAL_COMMUNITY): Payer: Self-pay

## 2012-12-28 ENCOUNTER — Encounter (HOSPITAL_COMMUNITY): Payer: Self-pay | Admitting: Radiology

## 2012-12-28 ENCOUNTER — Encounter (HOSPITAL_COMMUNITY)
Admission: RE | Disposition: A | Discharge status: Home / Self Care | Payer: Self-pay | Source: Ambulatory Visit | Attending: Pulmonary Disease

## 2012-12-28 ENCOUNTER — Ambulatory Visit (HOSPITAL_COMMUNITY)
Admission: RE | Admit: 2012-12-28 | Discharge: 2012-12-28 | Disposition: A | Discharge status: Home / Self Care | Payer: BC Managed Care – PPO | Source: Ambulatory Visit | Attending: Pulmonary Disease | Admitting: Pulmonary Disease

## 2012-12-28 DIAGNOSIS — K219 Gastro-esophageal reflux disease without esophagitis: Secondary | ICD-10-CM | POA: Insufficient documentation

## 2012-12-28 DIAGNOSIS — R042 Hemoptysis: Secondary | ICD-10-CM

## 2012-12-28 DIAGNOSIS — R0602 Shortness of breath: Secondary | ICD-10-CM | POA: Insufficient documentation

## 2012-12-28 DIAGNOSIS — Z87891 Personal history of nicotine dependence: Secondary | ICD-10-CM | POA: Insufficient documentation

## 2012-12-28 DIAGNOSIS — J3489 Other specified disorders of nose and nasal sinuses: Secondary | ICD-10-CM | POA: Insufficient documentation

## 2012-12-28 DIAGNOSIS — A310 Pulmonary mycobacterial infection: Secondary | ICD-10-CM

## 2012-12-28 DIAGNOSIS — J189 Pneumonia, unspecified organism: Secondary | ICD-10-CM | POA: Insufficient documentation

## 2012-12-28 HISTORY — PX: VIDEO BRONCHOSCOPY: SHX5072

## 2012-12-28 LAB — BODY FLUID CELL COUNT WITH DIFFERENTIAL
Eos, Fluid: 1 %
Eos, Fluid: 1 %
Lymphs, Fluid: 31 %
Lymphs, Fluid: 6 %
Monocyte-Macrophage-Serous Fluid: 7 % — ABNORMAL LOW (ref 50–90)
Monocyte-Macrophage-Serous Fluid: 9 % — ABNORMAL LOW (ref 50–90)
Neutrophil Count, Fluid: 59 % — ABNORMAL HIGH (ref 0–25)
Neutrophil Count, Fluid: 86 % — ABNORMAL HIGH (ref 0–25)
Total Nucleated Cell Count, Fluid: 1050 cu mm — ABNORMAL HIGH (ref 0–1000)
Total Nucleated Cell Count, Fluid: 41 cu mm (ref 0–1000)

## 2012-12-28 SURGERY — VIDEO BRONCHOSCOPY WITHOUT FLUORO
Anesthesia: Moderate Sedation | Laterality: Bilateral

## 2012-12-28 MED ORDER — LIDOCAINE HCL 1 % IJ SOLN
INTRAMUSCULAR | Status: DC | PRN
  Administered 2012-12-28: 6 mL via RESPIRATORY_TRACT

## 2012-12-28 MED ORDER — FENTANYL CITRATE 0.05 MG/ML IJ SOLN
INTRAMUSCULAR | Status: DC | PRN
  Administered 2012-12-28 (×2): 50 ug via INTRAVENOUS

## 2012-12-28 MED ORDER — MIDAZOLAM HCL 10 MG/2ML IJ SOLN
INTRAMUSCULAR | Status: AC
  Filled 2012-12-28: qty 4

## 2012-12-28 MED ORDER — MIDAZOLAM HCL 10 MG/2ML IJ SOLN
INTRAMUSCULAR | Status: DC | PRN
  Administered 2012-12-28 (×3): 2 mg via INTRAVENOUS

## 2012-12-28 MED ORDER — FENTANYL CITRATE 0.05 MG/ML IJ SOLN
INTRAMUSCULAR | Status: AC
  Filled 2012-12-28: qty 4

## 2012-12-28 NOTE — Procedures (Signed)
Bronchoscopy Procedure Note MYRLENE RIERA [id number] Jun 16, 1951  Procedure: Bronchoscopy Indications: Hemoptysis with history of bronchiectasis  Procedure Details Consent: Risks of procedure as well as the alternatives and risks of each were explained to the patient.  Consent for procedure obtained. Time Out: Verified patient identification, verified procedure, site/side was marked, verified correct patient position, special equipment/implants available, medications/allergies/relevent history reviewed, required imaging and test results available.  Performed  6 mg versed and 100 mcg fentanyl given for sedation and analgesia.  Initial attempt to pass bronchoscope orally, but patient had teeth clenched.  Then passed bronchoscope through right nares.  10 ml of 1% lidocaine instilled for topical anesthesia of airway.  Vocal cords visualized with normal motion.  Bronchoscope entered into trachea.  Carina visualized with normal mucosa.  Bronchoscope entered into Lt main bronchus.  Lt upper, lingular, and lower lobe orifices visualized mucosa normal w/o endobronchial lesions or evidence for bleeding.  Bronchoscope entered into Rt main bronchus.  Rt upper, middle, and lower lobe orifices were all visualized.  Mucosa normal, no endobronchial lesions, and no evidence of bleeding.  She did have mucoid secretions from RML > RUL.  60 ml saline instilled into RUL with 20 ml white cloudy fluid.  60 ml saline instilled to RML with 20 ml white cloudy fluid.  Evaluation Hemodynamic Status: BP stable throughout; O2 sats: stable throughout Patient's Current Condition: stable Specimens:  BAL RUL and RML for gram stain and culture, AFB, fungal culture, cytology, and cell count. Complications: No apparent complications Patient did tolerate procedure well.   Coralyn Helling, MD [facility] Pulmonary/Critical Care 12/28/2012, 9:37 AM Pager:  [phone] After 3pm call: [phone]

## 2012-12-28 NOTE — H&P (Signed)
Chief Complaint   Patient presents with   .  acute visit     saw MW 07/2010. pt c/o coughing up blood on 12/11/12. 1 tsp at a time-coughing fits at a time. has not coughed up any blood since. she has been taking delsym at bedtime and occasionally in the morning.    History of Present Illness:  Kim Vasquez is a 62 y.o. female former smoker for evaluation of hemoptysis.   She was seen by Dr. Sherene Sires in 2009 and 2011 for pulmonary infiltrates. She had bronchoscopy in 2009 which showed MAI, Penicillium, and Aspergillus Luxem[place].   She had episode of cough with blood on December 29. She coughed up about 1/2 dixie cup of blood during the course of that day. She has not had hemoptysis since.   She usually has occasional dry cough. She gets occasional sinus congestion. She denies recent nose bleeds. She denies fever, sweats, chills, skin rash, weight loss, or gland swelling. She has not had chest pain, wheeze, or dyspnea with exertion.   She denies recent sick exposures. She quit smoking years ago. 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   She has not been on antibiotics or prednisone recently. She has not had recent chest xray. She is not using any inhalers. She has been using delsym to help suppress her cough.    Tests:  02/08/08 CT chest >> RML tree in bud, mild BTX RML  05/29/08 Bronchoscopy >> scarring RML, cytology negative; MAI, Penicillium, and Aspergillus [place] in BAL  12/19/12 CT chest >> RUL and RML patchy tree in bud nodularity, BTX  Past Medical History   Diagnosis  Date   .  GERD (gastroesophageal reflux disease)    .  Cough    .  Pulmonary infiltrate    .  Seasonal allergies      Past Surgical History   Procedure  Date   .  Wrist surgery      No current outpatient prescriptions on file prior to visit.     Allergies   Allergen  Reactions   .  Bacitracin    .  Erythromycin    .  Neosporin (Neomycin-Bacitracin Zn-Polymyx)    .  Penicillins    .   Polysporin (Bacitracin-Polymyxin B)      Family History   Problem  Relation  Age of Onset   .  Lymphoma  Mother    .  Breast cancer  Sister    .  Colon cancer  Maternal Aunt    .  Heart disease        maternal grandparents     History   Substance Use Topics   .  Smoking status:  Former Smoker -- 1.0 packs/day for 14 years     Types:  Cigarettes     Quit date:  12/14/1981   .  Smokeless tobacco:  Not on file   .  Alcohol Use:  Yes      Comment: 2 per day     Review of Systems  Constitutional: Negative for appetite change and unexpected weight change.  HENT: Positive for congestion and dental problem. Negative for ear pain, sore throat, sneezing and trouble swallowing.  Respiratory: Positive for cough and shortness of breath.  Cardiovascular: Negative for chest pain, palpitations and leg swelling.  Gastrointestinal: Negative for abdominal pain.  Musculoskeletal: Negative for joint swelling.  Skin: Negative for rash.  Neurological: Negative for headaches.  Psychiatric/Behavioral: Negative for  dysphoric mood. The patient is not nervous/anxious.    Physical Exam:  Filed Vitals:    12/19/12 1404  12/19/12 1406   BP:   118/78   Pulse:   79   Temp:  97.3 F (36.3 C)    TempSrc:  Oral    Height:  5' 4.5" (1.638 m)    Weight:  137 lb (62.143 kg)    SpO2:   99%     Current Encounter SPO2   12/19/12  1406  99%   08/13/10  1522  98%   06/19/08  1616  99%     Wt Readings from Last 3 Encounters:   12/19/12  137 lb (62.143 kg)   08/13/10  145 lb 6.1 oz (65.945 kg)   06/19/08  144 lb (65.318 kg)     Body mass index is 23.15 kg/(m^2).  General - No distress  ENT - No sinus tenderness, no oral exudate, no LAN, no thyromegaly, TM clear, pupils equal/reactive  Cardiac - s1s2 regular, no murmur, pulses symmetric  Chest - No wheeze/rales/dullness, good air entry, normal respiratory excursion  Back - No focal tenderness  Abd - Soft, non-tender, no organomegaly, + bowel  sounds  Ext - No edema  Neuro - Normal strength, cranial nerves intact  Skin - No rashes  Psych - Normal mood, and behavior.    Lab Results   Component  Value  Date    WBC  5.7  03/15/2008    HGB  13.4  03/15/2008    HCT  38.2  03/15/2008    MCV  90.0  03/15/2008    PLT  282  03/15/2008     Lab Results   Component  Value  Date    CREATININE  0.69  03/15/2008    BUN  10  03/15/2008    NA  136  03/15/2008    K  4.3  03/15/2008    CL  97  03/15/2008    CO2  32  03/15/2008     Lab Results   Component  Value  Date    ALT  18  03/15/2008    AST  22  03/15/2008    ALKPHOS  59  03/15/2008    BILITOT  0.9  03/15/2008     Dg Chest 2 View  12/19/2012 *RADIOLOGY REPORT* Clinical Data: Hemoptysis CHEST - 2 VIEW Comparison: Chest x-ray of 08/13/2010 Findings: No active infiltrate or effusion is seen. Opacity in the right mid lung field on the frontal view appears stable compared to the prior chest x-ray and is consistent with scarring. Mediastinal contours are stable. The heart is within normal limits in size. No bony abnormality is seen. IMPRESSION: Probable scarring in the right mid lung possibly involving the right middle lobe. No active process. Original Report Authenticated By: Dwyane Dee, M.D.   Dg Chest 2 View  12/19/2012  *RADIOLOGY REPORT*  Clinical Data: Hemoptysis  CHEST - 2 VIEW  Comparison: Chest x-ray of 08/13/2010  Findings: No active infiltrate or effusion is seen.  Opacity in the right mid lung field on the frontal view appears stable compared to the prior chest x-ray and is consistent with scarring.  Mediastinal contours are stable.  The heart is within normal limits in size. No bony abnormality is seen.  IMPRESSION: Probable  scarring in the right mid lung possibly involving the right middle lobe.  No active process.   Original Report Authenticated By: Dwyane Dee, M.D.     Ct  Chest W Contrast  12/22/2012  *RADIOLOGY REPORT*   Clinical Data: Hemoptysis, history of MAI, chronic cough.  CT CHEST WITH  CONTRAST   Technique:  Multidetector CT imaging of the chest was performed following the standard protocol during bolus administration of intravenous contrast.   Contrast: 80mL OMNIPAQUE IOHEXOL 300 MG/ML  SOLN Comparison: Chest radiograph 12/19/2012, 02/08/2008 chest CT   Findings: Incidental hypodense right thyroid nodule is stable, likely benign.  Great vessels are normal in caliber.  No pericardial or pleural effusion.  Heart size is normal.  Great vessels are normal in caliber.  No lymphadenopathy.  Base of right upper lobe and right middle lobe patchy tree in bud type nodular airspace opacities with areas of bronchial wall thickening and distal inspissated secretions are again noted, increased.  No dominant pulmonary nodule, mass, or other consolidation.  Central airways are patent.  Incomplete imaging of the upper abdomen redemonstrates a normal appearance to visualized portions of the adrenal glands.  High-resolution CT images confirm the above findings.  No areas of air trapping or other evidence for interstitial lung disease.  T12 superior endplate Schmorl's node again noted.  No new osseous abnormality.   IMPRESSION: Predominately right upper and middle lobe tree in bud type nodular airspace opacities, bronchial wall thickening, and distal areas of inspissated secretions, consistent with small airways infection. Findings are increased since the prior exam 02/08/2008.    Original Report Authenticated By: Christiana Pellant, M.D.      Assessment/Plan:   Hemoptysis in setting of bronchiectasis with prior history of MAI.  She has prior, remote history of smoking. She has progression of her BTX on current CT chest. Plan: Will need bronchoscopy for airway inspection and sampling to further assess for recurrent/chronic respiratory infection  Bronchoscopy procedure explained to patient.  Risks detailed as bleeding, infection, pneumothorax, and non-diagnosis.  Coralyn Helling, MD [facility]  Pulmonary/Critical Care 12/28/2012, 8:18 AM Pager:  [phone] After 3pm call: [phone]

## 2012-12-28 NOTE — Progress Notes (Signed)
Bronch w/ video intervention performed, bronchial washing x2 intervention performed.

## 2012-12-28 NOTE — Progress Notes (Signed)
Coralyn Helling, MD [facility] Pulmonary/Critical Care 12/28/2012, 9:51 AM Pager:  [phone] After 3pm call: [phone]

## 2012-12-29 ENCOUNTER — Telehealth: Payer: Self-pay | Admitting: *Deleted

## 2012-12-29 ENCOUNTER — Institutional Professional Consult (permissible substitution): Payer: BC Managed Care – PPO | Admitting: Pulmonary Disease

## 2012-12-29 ENCOUNTER — Encounter (HOSPITAL_COMMUNITY): Payer: Self-pay | Admitting: Pulmonary Disease

## 2012-12-29 NOTE — Telephone Encounter (Signed)
Gloria from [facility] called with report that culture is growing AFB+1. I advised I will forward to Dr. Craige Cotta. Carron Curie, CMA

## 2012-12-30 ENCOUNTER — Telehealth: Payer: Self-pay | Admitting: Pulmonary Disease

## 2012-12-30 NOTE — Telephone Encounter (Signed)
Spoke with Diane and notified of recs per VS She verbalized understanding and states nothing further needed

## 2012-12-30 NOTE — Telephone Encounter (Signed)
Called, spoke with Sedalia Muta, RN with the [facility].  States she received a call from her lead case manager to f/u regarding pt's positive AFB Smear.  Diane would like to know: 1.  If Dr. Craige Cotta thinks this is TB or if pt has a hx of atypical mycobacerium 2.  If Dr. Craige Cotta would like the Health Dept to f/u with pt. Dr. Craige Cotta, pls advise.  Thank you.

## 2012-12-30 NOTE — Telephone Encounter (Signed)
BAL results from 12/28/12 >> cytology negative, AFB positive >> species ID pending.  Discussed preliminary results with Pt.  Likely MAI.  Unlikely to be M tuberculosis.  Will call when final species identified, and then determine if ID evaluation is needed.

## 2012-12-30 NOTE — Telephone Encounter (Signed)
Please inform health department AFB likely from atypical mycobacterium and not from M. Tuberculosis.  No need for health department follow up at this time.

## 2012-12-31 NOTE — Telephone Encounter (Signed)
Results d/w pt 12/30/12.

## 2013-01-10 ENCOUNTER — Telehealth: Payer: Self-pay | Admitting: Pulmonary Disease

## 2013-01-10 DIAGNOSIS — A310 Pulmonary mycobacterial infection: Secondary | ICD-10-CM

## 2013-01-10 DIAGNOSIS — J479 Bronchiectasis, uncomplicated: Secondary | ICD-10-CM

## 2013-01-10 NOTE — Telephone Encounter (Signed)
AFB CULTURE WITH SMEAR     Status: Normal (Preliminary result)   Collection Time   12/28/12  9:30 AM      Component Value Range Status Comment   Specimen Description BRONCHIAL ALVEOLAR LAVAGE RT MIDDLE LOBE   Final    Special Requests Normal   Final    ACID FAST SMEAR     Final    Value: 1+ ACID FAST BACILLI SEEN CRITICAL RESULT CALLED TO, READ BACK BY AND VERIFIED WITH: JENNIFER 17:05 12/29/12 FUENG CRITICAL RESULT CALLED TO, READ BACK BY AND VERIFIED WITH: SUSAN CARDWELL 13:10 12/30/12 FUENG   Culture     Final    Value: MYCOBACTERIUM AVIUM-INTRACELLULARE COMPLEX     Note: CRITICAL RESULT CALLED TO, READ BACK BY AND VERIFIED WITH: KATIE RN @ 9:17A ON 01/10/13 BY WILSN   Report Status PENDING   Incomplete      Results d/w pt.  She has recent episode of hemoptysis and progression of RUL and RML BTX.  Will refer to ID to further assess therapy options for MAI.

## 2013-01-18 ENCOUNTER — Ambulatory Visit (INDEPENDENT_AMBULATORY_CARE_PROVIDER_SITE_OTHER): Payer: BC Managed Care – PPO | Admitting: Infectious Diseases

## 2013-01-18 ENCOUNTER — Encounter: Payer: Self-pay | Admitting: Infectious Diseases

## 2013-01-18 VITALS — BP 144/87 | HR 80 | Temp 97.6°F | Ht 64.5 in | Wt 137.0 lb

## 2013-01-18 DIAGNOSIS — A310 Pulmonary mycobacterial infection: Secondary | ICD-10-CM

## 2013-01-18 NOTE — Assessment & Plan Note (Signed)
Dr. Ander Gaster and I spoke at length about therapy for this. Most concerning are her worsening findings on her CT scan. Clinically however she is unchanged. We spoke about the difficulties of the medications (liver toxicity, nausea) and the duration of therapy (1 year). Also, that there is no guarantee of cure with the therapy. I explained to her that typically patients who are treated have some symptomatic improvement over the year of therapy, however there is a significant relapse rate.  We discussed the plan and agreed that she will return in one year for a repeat CT scan of the chest. I asked her to let me know if she had any symptoms which would consider him warning signs: Fever chills, unexplained weight loss, worsening exercise tolerance, increasing productive cough or hemoptysis.

## 2013-01-18 NOTE — Progress Notes (Signed)
  Subjective:    Patient ID: Kim Vasquez, female    DOB: 1951/10/05, 62 y.o.   MRN: [id number]  HPI 62 year old female history of a CT scan in 2009 showing tree in bud appearance. She underwent a BAL which showed Aspergillus [place], Penicillium, and MAI.Was not treated at that time. She returns now after an episode of hemoptysis in January 2014. He went CT scan on January 9 showing: Predominately right upper and middle lobe tree in bud type nodular airspace opacities, bronchial wall thickening, and distal areas of  inspissated secretions, consistent with small airways infection. Findings are increased since the prior exam 02/08/2008.  She has undergone repeat December 28, 2012 bronchoscopy the culture growing MAI. Feels "ok", mild DOE, has chronic cough. Over last 4 years___ No further hemoptysis. No fever or chills. Weight has been fairly constant for last year. Lost weight 1 yr prior as she transitioned off diary.  Has not smoked in 30 years.   Review of Systems     Objective:   Physical Exam  Constitutional: She appears well-developed and well-nourished.  HENT:  Mouth/Throat: No oropharyngeal exudate.  Eyes: EOM are normal. Pupils are equal, round, and reactive to light.  Neck: Neck supple.  Cardiovascular: Normal rate, regular rhythm and normal heart sounds.   Pulmonary/Chest: Effort normal and breath sounds normal.  Abdominal: Soft. Bowel sounds are normal. There is no tenderness.  Lymphadenopathy:    She has no cervical adenopathy.    She has no axillary adenopathy.          Assessment & Plan:

## 2013-01-23 ENCOUNTER — Ambulatory Visit: Payer: BC Managed Care – PPO | Admitting: Pulmonary Disease

## 2013-01-23 ENCOUNTER — Encounter: Payer: Self-pay | Admitting: Pulmonary Disease

## 2013-01-23 ENCOUNTER — Ambulatory Visit (INDEPENDENT_AMBULATORY_CARE_PROVIDER_SITE_OTHER): Payer: BC Managed Care – PPO | Admitting: Pulmonary Disease

## 2013-01-23 VITALS — BP 120/76 | HR 75 | Temp 97.6°F | Ht 64.2 in | Wt 138.2 lb

## 2013-01-23 DIAGNOSIS — J479 Bronchiectasis, uncomplicated: Secondary | ICD-10-CM

## 2013-01-23 DIAGNOSIS — A310 Pulmonary mycobacterial infection: Secondary | ICD-10-CM

## 2013-01-23 DIAGNOSIS — Z23 Encounter for immunization: Secondary | ICD-10-CM

## 2013-01-23 DIAGNOSIS — R05 Cough: Secondary | ICD-10-CM

## 2013-01-23 DIAGNOSIS — R059 Cough, unspecified: Secondary | ICD-10-CM

## 2013-01-23 DIAGNOSIS — R053 Chronic cough: Secondary | ICD-10-CM | POA: Insufficient documentation

## 2013-01-23 MED ORDER — ALBUTEROL SULFATE HFA 108 (90 BASE) MCG/ACT IN AERS: 2.0000 | INHALATION_SPRAY | Freq: Four times a day (QID) | RESPIRATORY_TRACT | Status: DC | PRN

## 2013-01-23 NOTE — Assessment & Plan Note (Signed)
She has prior history of smoking.  She has changes of BTX on CT chest and sputum positive for MAI.  She reports symptoms of post-nasal drip.  She has persistent dry cough.  She had previously been on PPI therapy, but had no change in her cough.  Will arrange for PFT's to further assess.  Will give her trial of proair.  She is to continue nasal irrigation and benadryl.    Further assessment will be based on response to proair and results for PFT.

## 2013-01-23 NOTE — Progress Notes (Signed)
Chief Complaint  Patient presents with  . Follow-up    Pt saw Dr. Ninetta Lights 01/18/13. Pt states she has not coughed up any blood. Pt would like to discuss her OV with Dr. Ninetta Lights. Pt would like flu shot today.     History of Present Illness: LILLAR BIANCA is a 62 y.o. female former smoker with chronic cough, localized BTX, and MAI.  She was seen by ID.  Decision was made for clinical and radiographic monitoring off therapy.  She has persistent dry cough.  She denies wheezing, or chest tightness.  She takes benadryl at night at this helps.  She tries using claritin during the day, but this isn't as helpful.  She tried flonase before, but this gave her headaches.  She was on PPI therapy for possible reflux, but had no difference with her cough.  She has not had any recurrence of hemoptysis.  She denies fever, chills, sweats, gland swelling, or dyspnea on exertion.  Tests: 02/08/08 CT chest >> RML tree in bud, mild BTX RML 05/29/08 Bronchoscopy >> scarring RML, cytology negative; MAI, Penicillium, and Aspergillus [place] in BAL 12/22/12 CT chest >> RUL and RML tree in bud, BTX RML 12/28/12 Bronchoscopy >> MAI in BAL, cytology negative  Past Medical History  Diagnosis Date  . GERD (gastroesophageal reflux disease)   . Cough   . Pulmonary infiltrate   . Seasonal allergies     Past Surgical History  Procedure Laterality Date  . Wrist surgery    . Video bronchoscopy  12/28/2012    Procedure: VIDEO BRONCHOSCOPY WITHOUT FLUORO;  Surgeon: Coralyn Helling, MD;  Location: [facility];  Service: Cardiopulmonary;  Laterality: Bilateral;  . Breast surgery      Current Outpatient Prescriptions on File Prior to Visit  Medication Sig Dispense Refill  . Cholecalciferol (VITAMIN D PO) Take by mouth daily.      Marland Kitchen dextromethorphan (DELSYM) 30 MG/5ML liquid Take 60 mg by mouth as needed.      Marland Kitchen MAGNESIUM PO Take by mouth daily.      . Multiple Vitamin (MULTIVITAMIN) tablet Take 1 tablet by mouth daily.       . vitamin C (ASCORBIC ACID) 500 MG tablet Take 500 mg by mouth daily.       No current facility-administered medications on file prior to visit.    Allergies  Allergen Reactions  . Bacitracin   . Erythromycin   . Neosporin (Neomycin-Bacitracin Zn-Polymyx)   . Penicillins   . Polysporin (Bacitracin-Polymyxin B)     Physical Exam: Filed Vitals:   01/23/13 1337 01/23/13 1339  BP:  120/76  Pulse:  75  Temp: 97.6 F (36.4 C)   TempSrc: Oral   Height: 5' 4.2" (1.631 m)   Weight: 138 lb 3.2 oz (62.687 kg)   SpO2:  100%    Wt Readings from Last 3 Encounters:  01/23/13 138 lb 3.2 oz (62.687 kg)  01/18/13 137 lb (62.143 kg)  12/19/12 137 lb (62.143 kg)    Body mass index is 23.57 kg/(m^2).   General - No distress ENT - No sinus tenderness, no oral exudate, no LAN Cardiac - s1s2 regular, no murmur Chest - No wheeze/rales/dullness, good air entry, normal respiratory excursion Back - No focal tenderness Abd - Soft, non-tender Ext - No edema Neuro - Normal strength Skin - No rashes Psych - Normal mood, and behavior   Assessment/Plan:  Coralyn Helling, MD [place] Pulmonary/Critical Care/Sleep Pager:  [phone] 01/23/2013, 1:58 PM

## 2013-01-23 NOTE — Assessment & Plan Note (Signed)
Will assess need for further interventions after review of her PFT.

## 2013-01-23 NOTE — Patient Instructions (Signed)
Will schedule breathing test (PFT) and call with results Proair two puffs as needed for cough, wheezing, chest congestion, or shortness of breath Flu and Pneumonia shot today Follow up in 6 months

## 2013-01-23 NOTE — Addendum Note (Signed)
Addended by: Tommie Sams on: 01/23/2013 02:53 PM   Modules accepted: Orders

## 2013-01-23 NOTE — Assessment & Plan Note (Signed)
She has been seen by ID.  Agree with plan for clinical and radiographic observation, and then re-assess whether she needs therapy for MAI.

## 2013-01-25 LAB — FUNGUS CULTURE W SMEAR
Fungal Smear: NONE SEEN
Special Requests: NORMAL

## 2013-02-02 LAB — FUNGUS CULTURE W SMEAR
Fungal Smear: NONE SEEN
Special Requests: NORMAL

## 2013-02-03 ENCOUNTER — Ambulatory Visit: Payer: BC Managed Care – PPO | Admitting: Pulmonary Disease

## 2013-02-08 LAB — AFB CULTURE WITH SMEAR (NOT AT ARMC)
Acid Fast Smear: NONE SEEN
Special Requests: NORMAL
Special Requests: NORMAL

## 2013-03-01 ENCOUNTER — Ambulatory Visit (INDEPENDENT_AMBULATORY_CARE_PROVIDER_SITE_OTHER): Payer: BC Managed Care – PPO | Admitting: Pulmonary Disease

## 2013-03-01 DIAGNOSIS — J479 Bronchiectasis, uncomplicated: Secondary | ICD-10-CM

## 2013-03-01 DIAGNOSIS — R05 Cough: Secondary | ICD-10-CM

## 2013-03-01 DIAGNOSIS — R053 Chronic cough: Secondary | ICD-10-CM

## 2013-03-01 DIAGNOSIS — R059 Cough, unspecified: Secondary | ICD-10-CM

## 2013-03-01 LAB — PULMONARY FUNCTION TEST

## 2013-03-01 NOTE — Progress Notes (Signed)
PFT done today. 

## 2013-03-17 ENCOUNTER — Telehealth: Payer: Self-pay | Admitting: Pulmonary Disease

## 2013-03-17 NOTE — Telephone Encounter (Signed)
PFT 03/01/13 >> FEV1 2.92 (136%), FEV1% 85, TLC 5.91 (124%), DLCO 117%, borderline BD.  Left message explaining that PFT was normal except for borderline bronchodilator response based on FEF 25-75.  She can continue proair prn.  She is to call back with questions.

## 2013-03-28 ENCOUNTER — Encounter: Payer: Self-pay | Admitting: Pulmonary Disease

## 2013-07-06 ENCOUNTER — Other Ambulatory Visit: Payer: Self-pay

## 2013-07-06 DIAGNOSIS — Z1231 Encounter for screening mammogram for malignant neoplasm of breast: Secondary | ICD-10-CM

## 2013-07-26 ENCOUNTER — Ambulatory Visit
Admission: RE | Admit: 2013-07-26 | Discharge: 2013-07-26 | Disposition: A | Discharge status: Home / Self Care | Payer: BC Managed Care – PPO | Source: Ambulatory Visit

## 2013-07-26 DIAGNOSIS — Z1231 Encounter for screening mammogram for malignant neoplasm of breast: Secondary | ICD-10-CM

## 2013-08-31 ENCOUNTER — Encounter: Payer: Self-pay | Admitting: Obstetrics & Gynecology

## 2013-09-21 ENCOUNTER — Encounter: Payer: Self-pay | Admitting: Pulmonary Disease

## 2013-09-21 ENCOUNTER — Ambulatory Visit (INDEPENDENT_AMBULATORY_CARE_PROVIDER_SITE_OTHER): Payer: BC Managed Care – PPO | Admitting: Pulmonary Disease

## 2013-09-21 VITALS — BP 118/84 | HR 93 | Ht 64.5 in | Wt 141.0 lb

## 2013-09-21 DIAGNOSIS — R053 Chronic cough: Secondary | ICD-10-CM

## 2013-09-21 DIAGNOSIS — J479 Bronchiectasis, uncomplicated: Secondary | ICD-10-CM

## 2013-09-21 DIAGNOSIS — R059 Cough, unspecified: Secondary | ICD-10-CM

## 2013-09-21 DIAGNOSIS — A310 Pulmonary mycobacterial infection: Secondary | ICD-10-CM

## 2013-09-21 DIAGNOSIS — R05 Cough: Secondary | ICD-10-CM

## 2013-09-21 MED ORDER — MONTELUKAST SODIUM 10 MG PO TABS: 10.0000 mg | ORAL_TABLET | Freq: Every day | ORAL | Status: DC

## 2013-09-21 NOTE — Progress Notes (Signed)
Chief Complaint  Patient presents with  . Follow-up    Cough is still present. Seems to bother her more at night. Cough is not productive.    History of Present Illness: Kim Vasquez is a 62 y.o. female former smoker with chronic cough, localized BTX, and MAI.  She has noticed sinus congestion with drainage.  She is getting intermittent cough.  She is not having wheeze, fever, chest pain, weight loss, or hemoptysis.  She feels her symptoms are more of a nuisance.  She has not tried using proair.  Her cough happens more at night.  Tests: 02/08/08 CT chest >> RML tree in bud, mild BTX RML 05/29/08 Bronchoscopy >> scarring RML, cytology negative; MAI, Penicillium, and Aspergillus [place] in BAL 12/22/12 CT chest >> RUL and RML tree in bud, BTX RML 12/28/12 Bronchoscopy >> MAI in BAL, cytology negative PFT 03/01/13 >> FEV1 2.92 (136%), FEV1% 85, TLC 5.91 (124%), DLCO 117%, borderline BD.  She  has a past medical history of GERD (gastroesophageal reflux disease); Cough; Pulmonary infiltrate; and Seasonal allergies.  She  has past surgical history that includes Wrist surgery; Video bronchoscopy (12/28/2012); and Breast surgery.   Current Outpatient Prescriptions on File Prior to Visit  Medication Sig Dispense Refill  . Cholecalciferol (VITAMIN D PO) Take by mouth daily.      Marland Kitchen dextromethorphan (DELSYM) 30 MG/5ML liquid Take 60 mg by mouth as needed.      Marland Kitchen MAGNESIUM PO Take by mouth daily.      . Multiple Vitamin (MULTIVITAMIN) tablet Take 1 tablet by mouth daily.      . vitamin C (ASCORBIC ACID) 500 MG tablet Take 500 mg by mouth daily.      Marland Kitchen albuterol (PROAIR HFA) 108 (90 BASE) MCG/ACT inhaler Inhale 2 puffs into the lungs every 6 (six) hours as needed for wheezing or shortness of breath.  1 Inhaler  5   No current facility-administered medications on file prior to visit.    Allergies  Allergen Reactions  . Bacitracin   . Erythromycin   . Neosporin [Neomycin-Bacitracin Zn-Polymyx]   .  Penicillins   . Polysporin [Bacitracin-Polymyxin B]     Physical Exam:  General - No distress ENT - No sinus tenderness, no oral exudate, no LAN Cardiac - s1s2 regular, no murmur Chest - No wheeze/rales/dullness, good air entry, normal respiratory excursion Back - No focal tenderness Abd - Soft, non-tender Ext - No edema Neuro - Normal strength Skin - No rashes Psych - Normal mood, and behavior   Assessment/Plan:  Kim Helling, MD [place] Pulmonary/Critical Care/Sleep Pager:  [phone] 09/21/2013, 1:36 PM

## 2013-09-21 NOTE — Patient Instructions (Signed)
Montelukast (singulair) 10 mg pill >> take nightly before bedtime Proair two puffs as needed for cough, wheeze, or chest congestion Follow up in 2 months

## 2013-09-22 NOTE — Assessment & Plan Note (Signed)
This is in the setting of MAI.  I don't think her current symptoms are related to infection, and don' think she needs antibiotics at this time.   

## 2013-09-22 NOTE — Assessment & Plan Note (Signed)
Likely from post-nasal drip, and possible asthma.  She is reluctant use inhaled corticosteroids.  Will give her trial of singulair.  She can continue prn proair >> discussed indications for when she could try to use proair.    She has been on prilosec for possibility of reflux as cause of her cough, but has not noticed benefit.  Advised her to d/w her PCP about whether she should continue with PPI therapy.

## 2013-09-22 NOTE — Assessment & Plan Note (Signed)
She will have follow up with Dr. Ninetta Lights in February 2015, and likely will have repeat CT chest at that time.  Continue to monitor off antibiotic therapy for now.

## 2013-10-04 ENCOUNTER — Telehealth: Payer: Self-pay | Admitting: Pulmonary Disease

## 2013-10-04 NOTE — Telephone Encounter (Signed)
Spoke with pt. States that she has been taking Singulair for about 2 weeks. She has been feeling very anxious and feels "crappy." Is wondering if this is coming from Singulair. Advised pt not to take medication until she hears from [place].  Dr. Craige Cotta - please advise. Thanks.

## 2013-10-04 NOTE — Telephone Encounter (Signed)
Difficult to say if this is related to singulair.  Agree with plan to have pt stop singulair.  She should call back again if her symptoms do not resolve.

## 2013-10-04 NOTE — Telephone Encounter (Signed)
Pt is aware by message (per her earlier request) to stop taking singulair.

## 2013-10-12 ENCOUNTER — Encounter: Payer: Self-pay | Admitting: Obstetrics & Gynecology

## 2013-10-13 ENCOUNTER — Ambulatory Visit (INDEPENDENT_AMBULATORY_CARE_PROVIDER_SITE_OTHER): Payer: BC Managed Care – PPO | Admitting: Obstetrics & Gynecology

## 2013-10-13 ENCOUNTER — Encounter: Payer: Self-pay | Admitting: Obstetrics & Gynecology

## 2013-10-13 VITALS — BP 100/62 | HR 80 | Resp 16 | Ht 64.75 in | Wt 142.8 lb

## 2013-10-13 DIAGNOSIS — Z0000 Encounter for general adult medical examination without abnormal findings: Secondary | ICD-10-CM

## 2013-10-13 DIAGNOSIS — Z01419 Encounter for gynecological examination (general) (routine) without abnormal findings: Secondary | ICD-10-CM

## 2013-10-13 LAB — POCT URINALYSIS DIPSTICK
Bilirubin, UA: NEGATIVE
Glucose, UA: NEGATIVE
Ketones, UA: NEGATIVE
Leukocytes, UA: NEGATIVE
Nitrite, UA: NEGATIVE
Protein, UA: NEGATIVE
Urobilinogen, UA: NEGATIVE
pH, UA: 5

## 2013-10-13 LAB — HEMOGLOBIN, FINGERSTICK: Hemoglobin, fingerstick: 13.6 g/dL (ref 12.0–16.0)

## 2013-10-13 NOTE — Patient Instructions (Signed)

## 2013-10-13 NOTE — Progress Notes (Signed)
62 y.o. G2P2 MarriedCaucasianF here for annual exam.  No vaginal bleeding.  Dr. Kevan Ny is PCP.  Pt had episode of coughing up blood in January.  She had CT and bronchoscopy.  Reviewed CT with pt.  Thyroid nodule seen.  Pt with many questions.  Will need to find out size.  Patient's last menstrual period was 12/14/2004.          Sexually active: yes  The current method of family planning is vasectomy.    Exercising: yes  walking and gardening Smoker:  no  Health Maintenance: Pap:  08/31/12 WNL/negative HR HPV History of abnormal Pap:  no MMG:  07/26/13 normal Colonoscopy:  2012 repeat in 10 years, Dr. Laural Benes BMD:   2010, repeat BMD TDaP:  6/13 Screening Labs: PCP, Hb today: 13.6, Urine today: RBC-trace   reports that she quit smoking about 31 years ago. Her smoking use included Cigarettes. She has a 14 pack-year smoking history. She has never used smokeless tobacco. She reports that she drinks about 7.0 ounces of alcohol per week. She reports that she does not use illicit drugs.  Past Medical History  Diagnosis Date  . GERD (gastroesophageal reflux disease)   . Cough   . Pulmonary infiltrate   . Seasonal allergies   . IBS (irritable bowel syndrome)   . Osteopenia   . Anemia after childbirth  . Asthma     Past Surgical History  Procedure Laterality Date  . Wrist surgery      right hand carpal tunnel  . Video bronchoscopy  12/28/2012    Procedure: VIDEO BRONCHOSCOPY WITHOUT FLUORO;  Surgeon: Coralyn Helling, MD;  Location: [facility];  Service: Cardiopulmonary;  Laterality: Bilateral;  . Breast surgery      left breast  . Vulva /perineum biopsy      negative nevus    Current Outpatient Prescriptions  Medication Sig Dispense Refill  . albuterol (PROAIR HFA) 108 (90 BASE) MCG/ACT inhaler Inhale 2 puffs into the lungs every 6 (six) hours as needed for wheezing or shortness of breath.  1 Inhaler  5  . Cholecalciferol (VITAMIN D PO) Take by mouth daily.      Marland Kitchen MAGNESIUM PO Take by  mouth daily.      . Multiple Vitamin (MULTIVITAMIN) tablet Take 1 tablet by mouth daily.      Marland Kitchen omeprazole (PRILOSEC) 40 MG capsule Take 1 capsule by mouth daily.      . vitamin C (ASCORBIC ACID) 500 MG tablet Take 500 mg by mouth daily.      Marland Kitchen dextromethorphan (DELSYM) 30 MG/5ML liquid Take 60 mg by mouth as needed.      . montelukast (SINGULAIR) 10 MG tablet Take 1 tablet (10 mg total) by mouth at bedtime.  30 tablet  5   No current facility-administered medications for this visit.    Family History  Problem Relation Age of Onset  . Lymphoma Mother   . Breast cancer Sister   . Colon cancer Maternal Aunt   . Heart disease      maternal grandparents  . Diabetes Paternal Grandmother   . Hypertension Father   . Bipolar disorder Sister   . Bipolar disorder Maternal Grandmother   . Osteoporosis Paternal Grandmother     ROS:  Pertinent items are noted in HPI.  Otherwise, a comprehensive ROS was negative.  Exam:   BP 100/62  Pulse 80  Resp 16  Ht 5' 4.75" (1.645 m)  Wt 142 lb 12.8 oz (64.774 kg)  BMI 23.94 kg/m2  LMP 12/14/2004    Height: 5' 4.75" (164.5 cm)  Ht Readings from Last 3 Encounters:  10/13/13 5' 4.75" (1.645 m)  09/21/13 5' 4.5" (1.638 m)  01/23/13 5' 4.2" (1.631 m)    General appearance: alert, cooperative and appears stated age Head: Normocephalic, without obvious abnormality, atraumatic Neck: no adenopathy, supple, symmetrical, trachea midline and thyroid normal to inspection and palpation Lungs: clear to auscultation bilaterally Breasts: normal appearance, no masses or tenderness Heart: regular rate and rhythm Abdomen: soft, non-tender; bowel sounds normal; no masses,  no organomegaly Extremities: extremities normal, atraumatic, no cyanosis or edema Skin: Skin color, texture, turgor normal. No rashes or lesions Lymph nodes: Cervical, supraclavicular, and axillary nodes normal. No abnormal inguinal nodes palpated Neurologic: Grossly normal   Pelvic:  External genitalia:  no lesions              Urethra:  normal appearing urethra with no masses, tenderness or lesions              Bartholins and Skenes: normal                 Vagina: normal appearing vagina with normal color and discharge, no lesions              Cervix: no lesions              Pap taken: no Bimanual Exam:  Uterus:  normal size, contour, position, consistency, mobility, non-tender              Adnexa: normal adnexa and no mass, fullness, tenderness               Rectovaginal: Confirms               Anus:  normal sphincter tone, no lesions  A:  Well Woman with normal exam H/O MAC in lungs.  Hemoptysis in January.  CT/bronchoscopy again this year. Family gx if breast cancer, sister. H/O IBS/GERD Thyroid nodule on CT  P:   Mammogram yearly pap smear with neg HR HPV 2013.  No Pap today. Labs with Dr. Kevan Ny. Will call to see about size.  Feel no additional evaluation needed but pt still has some questions about this.   return annually or prn  An After Visit Summary was printed and given to the patient.

## 2013-11-29 ENCOUNTER — Encounter: Payer: Self-pay | Admitting: Pulmonary Disease

## 2013-11-29 ENCOUNTER — Ambulatory Visit (INDEPENDENT_AMBULATORY_CARE_PROVIDER_SITE_OTHER): Payer: BC Managed Care – PPO | Admitting: Pulmonary Disease

## 2013-11-29 VITALS — BP 128/80 | HR 78 | Ht 64.5 in | Wt 143.0 lb

## 2013-11-29 DIAGNOSIS — A310 Pulmonary mycobacterial infection: Secondary | ICD-10-CM

## 2013-11-29 DIAGNOSIS — R058 Other specified cough: Secondary | ICD-10-CM

## 2013-11-29 DIAGNOSIS — J45909 Unspecified asthma, uncomplicated: Secondary | ICD-10-CM

## 2013-11-29 DIAGNOSIS — J479 Bronchiectasis, uncomplicated: Secondary | ICD-10-CM

## 2013-11-29 DIAGNOSIS — R053 Chronic cough: Secondary | ICD-10-CM

## 2013-11-29 DIAGNOSIS — R05 Cough: Secondary | ICD-10-CM

## 2013-11-29 DIAGNOSIS — J4520 Mild intermittent asthma, uncomplicated: Secondary | ICD-10-CM

## 2013-11-29 DIAGNOSIS — R059 Cough, unspecified: Secondary | ICD-10-CM

## 2013-11-29 NOTE — Progress Notes (Signed)
Chief Complaint  Patient presents with  . Follow-up    Pt c/o mostly nonprod cough with trace yellow mucous in a.m. Pt denies any fever, chills, chest tightness.    History of Present Illness: Kim Vasquez is a 62 y.o. female former smoker with chronic cough, localized BTX, and MAI.  She tried singulair for about 2 weeks.  This did not seem to improve her cough, but did make her anxious.  The anxious feeling resolved after she stopped singulair.  She has been seen by Dr. Annalee Genta with ENT and started on nasal steroids.  This seems to help.  She also takes benadryl at night, and sometimes uses claritin in the day.  She still has a cough.  She will some times bring up clear to yellow sputum.  She denies fever, chest pain, sweats, weight loss, or hemoptysis.  She uses her proair sometimes.  This helps when she uses it, but does not use it very often.  She does not feel like her breathing limits her activity.  Tests: 02/08/08 CT chest >> RML tree in bud, mild BTX RML 05/29/08 Bronchoscopy >> scarring RML, cytology negative; MAI, Penicillium, and Aspergillus [place] in BAL 12/22/12 CT chest >> RUL and RML tree in bud, BTX RML 12/28/12 Bronchoscopy >> MAI in BAL, cytology negative PFT 03/01/13 >> FEV1 2.92 (136%), FEV1% 85, TLC 5.91 (124%), DLCO 117%, borderline BD.  She  has a past medical history of GERD (gastroesophageal reflux disease); Pulmonary infiltrate; Seasonal allergies; IBS (irritable bowel syndrome); Osteopenia; History of anemia (after childbirth); Asthma; and Mycobacterium avium-intracellulare complex.  She  has past surgical history that includes Carpal tunnel release (2007); Video bronchoscopy (12/28/2012); and Breast surgery (2/07).   Current Outpatient Prescriptions on File Prior to Visit  Medication Sig Dispense Refill  . albuterol (PROAIR HFA) 108 (90 BASE) MCG/ACT inhaler Inhale 2 puffs into the lungs every 6 (six) hours as needed for wheezing or shortness of breath.  1  Inhaler  5  . Cholecalciferol (VITAMIN D PO) Take by mouth daily.      Marland Kitchen dextromethorphan (DELSYM) 30 MG/5ML liquid Take 60 mg by mouth as needed.      Marland Kitchen MAGNESIUM PO Take by mouth daily.      . montelukast (SINGULAIR) 10 MG tablet Take 1 tablet (10 mg total) by mouth at bedtime.  30 tablet  5  . Multiple Vitamin (MULTIVITAMIN) tablet Take 1 tablet by mouth daily.      . vitamin C (ASCORBIC ACID) 500 MG tablet Take 500 mg by mouth daily.       No current facility-administered medications on file prior to visit.    Allergies  Allergen Reactions  . Bacitracin   . Erythromycin   . Neosporin [Neomycin-Bacitracin Zn-Polymyx]   . Penicillins   . Polysporin [Bacitracin-Polymyxin B]     Physical Exam:  General - No distress ENT - No sinus tenderness, no oral exudate, no LAN Cardiac - s1s2 regular, no murmur Chest - No wheeze/rales/dullness, good air entry, normal respiratory excursion Back - No focal tenderness Abd - Soft, non-tender Ext - No edema Neuro - Normal strength Skin - No rashes Psych - Normal mood, and behavior   Assessment/Plan:  Kim Helling, MD [facility]/Sleep Pager:  [phone] 11/29/2013, 1:44 PM

## 2013-11-29 NOTE — Patient Instructions (Signed)
Follow up in 1 one year

## 2013-11-30 ENCOUNTER — Telehealth: Payer: Self-pay | Admitting: Pulmonary Disease

## 2013-11-30 DIAGNOSIS — R058 Other specified cough: Secondary | ICD-10-CM | POA: Insufficient documentation

## 2013-11-30 DIAGNOSIS — R05 Cough: Secondary | ICD-10-CM | POA: Insufficient documentation

## 2013-11-30 DIAGNOSIS — J4520 Mild intermittent asthma, uncomplicated: Secondary | ICD-10-CM | POA: Insufficient documentation

## 2013-11-30 NOTE — Assessment & Plan Note (Signed)
She is to continue inhaled nasal steroids and anti-histamine therapy per ENT.

## 2013-11-30 NOTE — Telephone Encounter (Signed)
Reviewed case with Dr. Ninetta Lights.  He is in agreement that she does not need ID follow up at this time if there is no plan to start therapy for MAI.  Informed Kim Vasquez about this.

## 2013-11-30 NOTE — Assessment & Plan Note (Signed)
This is in the setting of MAI.  I don't think her current symptoms are related to infection, and don' think she needs antibiotics at this time.

## 2013-11-30 NOTE — Assessment & Plan Note (Signed)
Likely from upper airway cough syndrome with post-nasal drip, and probable asthma.

## 2013-11-30 NOTE — Assessment & Plan Note (Signed)
She does not feel her asthma is that significant a problem at this time.  She is reluctant to use inhaled corticosteroids for this.  She was intolerant of leukotriene inhibitor singulair.    Will continue prn proair for now.

## 2013-11-30 NOTE — Assessment & Plan Note (Signed)
She was scheduled for f/u with Dr. Ninetta Lights in ID clinic in February 2015.  She is not certain she needs this follow up since she has not noticed any progression of her symptoms, and therefore would not want to consider therapy for MAI at this time.  She would also like to defer f/u CT imaging unless her symptoms progress.

## 2014-08-08 ENCOUNTER — Other Ambulatory Visit: Payer: Self-pay

## 2014-08-08 DIAGNOSIS — Z1231 Encounter for screening mammogram for malignant neoplasm of breast: Secondary | ICD-10-CM

## 2014-08-29 ENCOUNTER — Ambulatory Visit
Admission: RE | Admit: 2014-08-29 | Discharge: 2014-08-29 | Disposition: A | Discharge status: Home / Self Care | Payer: BC Managed Care – PPO | Source: Ambulatory Visit

## 2014-08-29 ENCOUNTER — Encounter (INDEPENDENT_AMBULATORY_CARE_PROVIDER_SITE_OTHER): Payer: Self-pay

## 2014-08-29 DIAGNOSIS — Z1231 Encounter for screening mammogram for malignant neoplasm of breast: Secondary | ICD-10-CM

## 2014-10-15 ENCOUNTER — Encounter: Payer: Self-pay | Admitting: Pulmonary Disease

## 2014-11-06 ENCOUNTER — Telehealth: Payer: Self-pay

## 2014-11-06 NOTE — Telephone Encounter (Signed)
LMTCB concerning appt being canceled due to provider being in surgery

## 2014-11-07 NOTE — Telephone Encounter (Signed)
LMTCB Left message stating the office visit on 11/13/14 has been canceled and would like for the pt to call the office to reschedule.

## 2014-11-07 NOTE — Telephone Encounter (Signed)
Returning call.

## 2014-11-07 NOTE — Telephone Encounter (Signed)
Returning a call to [place].

## 2014-11-07 NOTE — Telephone Encounter (Signed)
Routing to [place] for reschedule.

## 2014-11-07 NOTE — Telephone Encounter (Signed)
Pt AEX appt is made for 01/07/15  Encounter closed

## 2014-11-13 ENCOUNTER — Encounter: Payer: Self-pay | Admitting: Certified Nurse Midwife

## 2014-11-13 ENCOUNTER — Ambulatory Visit: Payer: BC Managed Care – PPO | Admitting: Obstetrics & Gynecology

## 2014-11-13 ENCOUNTER — Ambulatory Visit (INDEPENDENT_AMBULATORY_CARE_PROVIDER_SITE_OTHER): Payer: BC Managed Care – PPO | Admitting: Certified Nurse Midwife

## 2014-11-13 VITALS — BP 118/70 | HR 64 | Temp 98.1°F | Resp 16 | Wt 139.6 lb

## 2014-11-13 DIAGNOSIS — N390 Urinary tract infection, site not specified: Secondary | ICD-10-CM

## 2014-11-13 DIAGNOSIS — N951 Menopausal and female climacteric states: Secondary | ICD-10-CM

## 2014-11-13 DIAGNOSIS — B3731 Acute candidiasis of vulva and vagina: Secondary | ICD-10-CM

## 2014-11-13 DIAGNOSIS — B373 Candidiasis of vulva and vagina: Secondary | ICD-10-CM

## 2014-11-13 DIAGNOSIS — R300 Dysuria: Secondary | ICD-10-CM

## 2014-11-13 LAB — POCT URINALYSIS DIPSTICK
Bilirubin, UA: NEGATIVE
Blood, UA: NEGATIVE
Glucose, UA: NEGATIVE
Ketones, UA: NEGATIVE
Leukocytes, UA: NEGATIVE
Nitrite, UA: NEGATIVE
Protein, UA: NEGATIVE
Urobilinogen, UA: NEGATIVE
pH, UA: 6.5

## 2014-11-13 MED ORDER — NITROFURANTOIN MONOHYD MACRO 100 MG PO CAPS: 100.0000 mg | ORAL_CAPSULE | Freq: Two times a day (BID) | ORAL | Status: DC

## 2014-11-13 MED ORDER — TERCONAZOLE 0.4 % VA CREA: 1.0000 | TOPICAL_CREAM | Freq: Every day | VAGINAL | Status: DC

## 2014-11-13 NOTE — Progress Notes (Signed)
63 y.o. married g2p2002 here with complaint of UTI, with onset  on 24 hours. Patient complaining of some urinary frequency/urgency/ and pain with urination which has increased over the am. Patient denies fever, chills, nausea or back pain. No new personal products. Patient feels not to sexual activity. Complaining of vaginal symptoms with burning when urine touches skin..  Menopausal with ? vaginal dryness.No increase in vaginal discharge.   O: Healthy female WDWN Affect: Normal, orientation x 3 Skin : warm and dry CVAT: negative bilateral Abdomen: positive for suprapubic tenderness  Pelvic exam: External genital area: normal, no lesions Bladder,Urethra, Urethral meatus: tender Vagina:scant vaginal discharge, normal appearance, tender, no odor  Wet prep taken, ph 4.0 Cervix: normal, non tender Uterus:normal,non tender Adnexa: normal non tender, no fullness or masses   A: UTI Yeast vaginitis Vaginal dryness  P: Reviewed findings of UTI and need to increase water intake. Rx: Macrobid see order [id number]  culture Reviewed warning signs and symptoms of UTI Encouraged to limit soda, tea, and coffee Discussed yeast vaginitis and some dryness which is contributing to burning. Aveeno sitz bath for comfort if needed. Rx Diflucan see order with instructions Start with coconut oil for dryness after treatment.  RV prn

## 2014-11-13 NOTE — Patient Instructions (Signed)

## 2014-11-15 LAB — URINE CULTURE: Colony Count: 30000

## 2014-11-16 ENCOUNTER — Ambulatory Visit (INDEPENDENT_AMBULATORY_CARE_PROVIDER_SITE_OTHER): Payer: BC Managed Care – PPO | Admitting: Certified Nurse Midwife

## 2014-11-16 ENCOUNTER — Encounter: Payer: Self-pay | Admitting: Certified Nurse Midwife

## 2014-11-16 VITALS — BP 110/70 | HR 68 | Resp 16 | Ht 64.75 in

## 2014-11-16 DIAGNOSIS — B3731 Acute candidiasis of vulva and vagina: Secondary | ICD-10-CM

## 2014-11-16 DIAGNOSIS — B373 Candidiasis of vulva and vagina: Secondary | ICD-10-CM

## 2014-11-16 DIAGNOSIS — N390 Urinary tract infection, site not specified: Secondary | ICD-10-CM

## 2014-11-16 LAB — POCT URINALYSIS DIPSTICK
Bilirubin, UA: NEGATIVE
Blood, UA: NEGATIVE
Glucose, UA: NEGATIVE
Ketones, UA: NEGATIVE
Leukocytes, UA: NEGATIVE
Nitrite, UA: NEGATIVE
Protein, UA: NEGATIVE
Urobilinogen, UA: NEGATIVE
pH, UA: 7

## 2014-11-16 MED ORDER — PHENAZOPYRIDINE HCL 200 MG PO TABS: 200.0000 mg | ORAL_TABLET | Freq: Three times a day (TID) | ORAL | Status: DC | PRN

## 2014-11-16 MED ORDER — FLUCONAZOLE 150 MG PO TABS: ORAL_TABLET | ORAL | Status: DC

## 2014-11-16 NOTE — Progress Notes (Signed)
63 y.o. married white female g2p2002 here with complaint of urethral meatus spasms. Patient was seen on 11/13/14 for UTI and was given Macrobid and Terazol 7 vaginal cream for UTI and yeast vaginitis. Patient states urinary and yeast symptoms are improving, but the spasms are keeping her up with frequency. Patient denies fever, chills, nausea or back pain. Patient has been increasing fluids also. No other concerns.  O: Healthy female WDWN Affect: Normal, orientation x 3 Skin : warm and dry CVAT: negative bilateral Abdomen: negative for suprapubic tenderness  Pelvic exam: External genital area: normal, no lesions Bladder,Urethra tender, Urethral meatus: tender and red Vagina: white  vaginal discharge, normal appearance   Terazol exudate noted Cervix: normal, non tender Uterus:normal,non tender Adnexa: normal non tender, no fullness or masses   A: UTI under treatment with Macrobid Urine culture in 30,000 multi bacteria Yeast vaginitis under treatment with Terazol 7 Urethral spasms  P: Reviewed findings of less tenderness today with medication use.Discussed Pyridium use for spasms, agreeable [id number] see order with instructions Rx Diflucan see order, use instead of cream to reduce touching of meatus Reviewed warning signs and symptoms of UTI Encouraged to limit soda, tea, and coffee Patient will call if not resolving.  RV prn

## 2014-11-21 NOTE — Progress Notes (Signed)
Reviewed personally.  M. Suzanne Jayden Rudge, MD.  

## 2014-11-21 NOTE — Progress Notes (Signed)
Reviewed personally.  M. Suzanne Mithran Strike, MD.  

## 2014-11-28 ENCOUNTER — Ambulatory Visit (INDEPENDENT_AMBULATORY_CARE_PROVIDER_SITE_OTHER): Payer: BC Managed Care – PPO | Admitting: Pulmonary Disease

## 2014-11-28 ENCOUNTER — Encounter: Payer: Self-pay | Admitting: Pulmonary Disease

## 2014-11-28 VITALS — BP 130/86 | HR 76 | Ht 64.5 in | Wt 136.6 lb

## 2014-11-28 DIAGNOSIS — J479 Bronchiectasis, uncomplicated: Secondary | ICD-10-CM

## 2014-11-28 DIAGNOSIS — Z87891 Personal history of nicotine dependence: Secondary | ICD-10-CM

## 2014-11-28 DIAGNOSIS — R058 Other specified cough: Secondary | ICD-10-CM

## 2014-11-28 DIAGNOSIS — A310 Pulmonary mycobacterial infection: Secondary | ICD-10-CM

## 2014-11-28 DIAGNOSIS — R05 Cough: Secondary | ICD-10-CM

## 2014-11-28 DIAGNOSIS — J4520 Mild intermittent asthma, uncomplicated: Secondary | ICD-10-CM

## 2014-11-28 DIAGNOSIS — R053 Chronic cough: Secondary | ICD-10-CM

## 2014-11-28 MED ORDER — ALBUTEROL SULFATE HFA 108 (90 BASE) MCG/ACT IN AERS: 2.0000 | INHALATION_SPRAY | Freq: Four times a day (QID) | RESPIRATORY_TRACT | Status: DC | PRN

## 2014-11-28 MED ORDER — FLUTICASONE PROPIONATE 50 MCG/ACT NA SUSP: 1.0000 | Freq: Every day | NASAL | Status: DC

## 2014-11-28 NOTE — Progress Notes (Signed)
Chief Complaint  Patient presents with  . Follow-up    Pt states that cough is the same since 2014-- some SOB noted at times.     History of Present Illness: Kim Vasquez is a 63 y.o. female former smoker with chronic cough, localized BTX, and MAI.  She still gets intermittent cough.  She is not bringing up much sputum.  She denies fever, chest pain, or hemoptysis.  She does not feel like she can keep up with activities the way she used to, but is still very active.  She went on trip to [facility] over the Summer >> hiked down and back w/o too much trouble from her breathing on the hottest time of the year.  She has been using her albuterol several times per week >> this helps.  She has been on Abx for UTI >> her breathing seems to get better temporarily when she is on Abx.  Tests: 02/08/08 CT chest >> RML tree in bud, mild BTX RML 05/29/08 Bronchoscopy >> scarring RML, cytology negative; MAI, Penicillium, and Aspergillus [place] in BAL 12/22/12 CT chest >> RUL and RML tree in bud, BTX RML 12/28/12 Bronchoscopy >> MAI in BAL, cytology negative PFT 03/01/13 >> FEV1 2.92 (136%), FEV1% 85, TLC 5.91 (124%), DLCO 117%, borderline BD.  PMHx >> GERD, IBS  PSHx, Medications, Allergies, Fhx, Shx reviewed.  Physical Exam: Blood pressure 130/86, pulse 76, height 5' 4.5" (1.638 m), weight 136 lb 9.6 oz (61.961 kg), last menstrual period 12/14/2004, SpO2 98 %. Body mass index is 23.09 kg/(m^2).  General - No distress ENT - No sinus tenderness, no oral exudate, no LAN Cardiac - s1s2 regular, no murmur Chest - No wheeze/rales/dullness, good air entry, normal respiratory excursion Back - No focal tenderness Abd - Soft, non-tender Ext - No edema Neuro - Normal strength Skin - No rashes Psych - Normal mood, and behavior   Assessment/Plan:  Chronic cough asthma, regional bronchiectasis, and post-nasal drip. She is reluctant to use ICS, and was not able to tolerate leukotriene inhibitors. Plan: -  continue prn albuterol, flonase  Hx of MAI >> not much regarding symptoms, and she would not want to risk side effects of therapy at this time. Plan: - monitor clinically - defer f/u CT chest or sputum sampling unless her symptoms progress   Chesley Mires, MD [facility]/Sleep Pager:  [phone] 11/28/2014, 11:19 AM

## 2014-11-28 NOTE — Patient Instructions (Signed)
Follow up in 1 year.

## 2014-12-25 ENCOUNTER — Encounter: Payer: Self-pay | Admitting: Certified Nurse Midwife

## 2014-12-25 ENCOUNTER — Ambulatory Visit (INDEPENDENT_AMBULATORY_CARE_PROVIDER_SITE_OTHER): Payer: BC Managed Care – PPO | Admitting: Certified Nurse Midwife

## 2014-12-25 VITALS — BP 120/84 | HR 70 | Temp 98.1°F | Resp 16 | Ht 64.75 in | Wt 134.0 lb

## 2014-12-25 DIAGNOSIS — Z87448 Personal history of other diseases of urinary system: Secondary | ICD-10-CM

## 2014-12-25 DIAGNOSIS — B373 Candidiasis of vulva and vagina: Secondary | ICD-10-CM

## 2014-12-25 DIAGNOSIS — N952 Postmenopausal atrophic vaginitis: Secondary | ICD-10-CM

## 2014-12-25 DIAGNOSIS — B3731 Acute candidiasis of vulva and vagina: Secondary | ICD-10-CM

## 2014-12-25 DIAGNOSIS — N390 Urinary tract infection, site not specified: Secondary | ICD-10-CM

## 2014-12-25 DIAGNOSIS — Z87898 Personal history of other specified conditions: Secondary | ICD-10-CM

## 2014-12-25 MED ORDER — PHENAZOPYRIDINE HCL 100 MG PO TABS: 100.0000 mg | ORAL_TABLET | Freq: Three times a day (TID) | ORAL | Status: DC | PRN

## 2014-12-25 NOTE — Patient Instructions (Signed)

## 2014-12-25 NOTE — Progress Notes (Signed)
64 y.o. married g2p2002 here with complaint of bladder spasms of urethra., with onset  on one week ago. Patient complaining of urinary frequency/urgency at night at times. Used Pyridium given a few weeks ago which provides immediate relief from spasm. Patient denies fever, chills, nausea or back pain. No new personal products. Patient feels not related to sexual activity. Denies any vaginal symptoms except slight itching occasional. Denies any vaginal or urinary pain or difficulty emptying bladder.Denies any vaginal bleeding, some discomfort with sexual activity.   O: Healthy female WDWN Affect: Normal, orientation x 3 Skin : warm and dry CVAT: negative bilateral Abdomen: negative for suprapubic tenderness  Pelvic exam: External genital area:inside vulva increase pink with thin tissue noted, slightly tender to touch Affirm taken of area and vagina. Bladder,Urethra not tender, Urethral meatus: slightly tender Vagina:scant moisture, with thin vaginal wall noted Cervix: normal, non tender Uterus:normal,non tender Adnexa: normal non tender, no fullness or masses   A: Atrophic vaginitis Poct urine-neg R/O yeast vaginitis  P: Reviewed findings of atrophic vaginitis and etilogy. Discussed estrogen use to help with thinning and discomfort and reduce urethral meatus sensitivity. Discussed risks and benefits. Questions addressed. Declines estrogen. Discussed consistent coconut oil use to area shown in mirror to increase moisture and discomfort. Also to insert in vagina to improve moisture and appearance. Patient will try. Offered referral to urology with urethral spasm, declines due to doesn't happen all the time. RxPyridium prn bladder spasm Lab: Affirm Reviewed warning signs and symptoms of UTI. Patient will use OTC moisture and advise if symptoms are better in 1-2 weeks or if discomfort increases, will need referral. Patient will consider.  RV prn

## 2014-12-26 LAB — WET PREP BY MOLECULAR PROBE
Candida species: NEGATIVE
Gardnerella vaginalis: NEGATIVE
Trichomonas vaginosis: NEGATIVE

## 2014-12-27 ENCOUNTER — Telehealth: Payer: Self-pay

## 2014-12-27 NOTE — Telephone Encounter (Signed)
Spoke with patient. Results given. Patient is agreeable.  Routing to provider for final review. Patient agreeable to disposition. Will close encounter

## 2014-12-27 NOTE — Progress Notes (Signed)
Reviewed personally.  M. Suzanne Caeleigh Prohaska, MD.  

## 2014-12-27 NOTE — Telephone Encounter (Signed)
-----   Message from Milford Cage, [place] sent at 12/26/2014  7:32 PM EST ----- Please let patient know that Affirm test is negative.

## 2014-12-27 NOTE — Telephone Encounter (Signed)
lmtcb

## 2015-01-03 ENCOUNTER — Telehealth: Payer: Self-pay | Admitting: Certified Nurse Midwife

## 2015-01-03 NOTE — Telephone Encounter (Signed)
Pt is having vaginal swelling and irritation. Would like to be seen today if possible.

## 2015-01-03 NOTE — Telephone Encounter (Signed)
Left message to call Aydia Maj at [phone]. 

## 2015-01-07 ENCOUNTER — Ambulatory Visit (INDEPENDENT_AMBULATORY_CARE_PROVIDER_SITE_OTHER): Payer: BC Managed Care – PPO | Admitting: Obstetrics & Gynecology

## 2015-01-07 ENCOUNTER — Encounter: Payer: Self-pay | Admitting: Obstetrics & Gynecology

## 2015-01-07 VITALS — BP 116/74 | HR 68 | Resp 16 | Ht 64.5 in | Wt 136.6 lb

## 2015-01-07 DIAGNOSIS — Z01419 Encounter for gynecological examination (general) (routine) without abnormal findings: Secondary | ICD-10-CM

## 2015-01-07 DIAGNOSIS — R899 Unspecified abnormal finding in specimens from other organs, systems and tissues: Secondary | ICD-10-CM

## 2015-01-07 DIAGNOSIS — Z124 Encounter for screening for malignant neoplasm of cervix: Secondary | ICD-10-CM

## 2015-01-07 DIAGNOSIS — Z0000 Encounter for general adult medical examination without abnormal findings: Secondary | ICD-10-CM

## 2015-01-07 LAB — POCT URINALYSIS DIPSTICK
Bilirubin, UA: NEGATIVE
Glucose, UA: NEGATIVE
Ketones, UA: NEGATIVE
Leukocytes, UA: NEGATIVE
Nitrite, UA: NEGATIVE
Protein, UA: NEGATIVE
Urobilinogen, UA: NEGATIVE
pH, UA: 6.5

## 2015-01-07 LAB — COMPREHENSIVE METABOLIC PANEL
ALT: 14 U/L (ref 0–35)
AST: 18 U/L (ref 0–37)
Albumin: 4.2 g/dL (ref 3.5–5.2)
Alkaline Phosphatase: 71 U/L (ref 39–117)
BUN: 7 mg/dL (ref 6–23)
CO2: 27 mEq/L (ref 19–32)
Calcium: 8.8 mg/dL (ref 8.4–10.5)
Chloride: 94 mEq/L — ABNORMAL LOW (ref 96–112)
Creat: 0.68 mg/dL (ref 0.50–1.10)
Glucose, Bld: 71 mg/dL (ref 70–99)
Potassium: 5 mEq/L (ref 3.5–5.3)
Sodium: 129 mEq/L — ABNORMAL LOW (ref 135–145)
Total Bilirubin: 0.4 mg/dL (ref 0.2–1.2)
Total Protein: 6.6 g/dL (ref 6.0–8.3)

## 2015-01-07 LAB — HEMOGLOBIN, FINGERSTICK: Hemoglobin, fingerstick: 12.7 g/dL (ref 12.0–16.0)

## 2015-01-07 LAB — LIPID PANEL
Cholesterol: 207 mg/dL — ABNORMAL HIGH (ref 0–200)
HDL: 66 mg/dL (ref >39)
LDL Cholesterol: 117 mg/dL — ABNORMAL HIGH (ref 0–99)
Total CHOL/HDL Ratio: 3.1 Ratio
Triglycerides: 121 mg/dL (ref <150)
VLDL: 24 mg/dL (ref 0–40)

## 2015-01-07 LAB — CBC
HCT: 37.9 % (ref 36.0–46.0)
Hemoglobin: 13.3 g/dL (ref 12.0–15.0)
MCH: 31.1 pg (ref 26.0–34.0)
MCHC: 35.1 g/dL (ref 30.0–36.0)
MCV: 88.8 fL (ref 78.0–100.0)
MPV: 9.4 fL (ref 8.6–12.4)
Platelets: 324 10*3/uL (ref 150–400)
RBC: 4.27 MIL/uL (ref 3.87–5.11)
RDW: 12.2 % (ref 11.5–15.5)
WBC: 5.3 10*3/uL (ref 4.0–10.5)

## 2015-01-07 NOTE — Progress Notes (Signed)
64 y.o. G2P2 MarriedCaucasianF here for annual exam.  Doing well.  Daughter got married in august but pt has celebration over the holidays at her home.  This was a busy time for her.    Pulmonologist:  Dr. Halford Chessman.  Being followed for mycobacterium.  Not on antibiotics.  Having some vaginal dryness right now that is causing more dryness and irritation.  Using coconut oil.    PCP:  Dr. Darcus Austin.   Patient's last menstrual period was 12/14/2004.          Sexually active: Yes.    The current method of family planning is vasectomy.    Exercising: Yes.    walking and gardening Smoker:  Former smoker 30+ years ago  Health Maintenance: Pap:  08/31/12 WNL/negative HR HPV History of abnormal Pap:  no MMG:  08/29/14 3D-normal Colonoscopy:  2012-repeat in 10 years Dr Wynetta Emery BMD:   2010 TDaP:  6/13 Screening Labs: today, Hb today: 12.7, Urine today: PH-6.5, RBC-trace   reports that she quit smoking about 33 years ago. Her smoking use included Cigarettes. She has a 14 pack-year smoking history. She has never used smokeless tobacco. She reports that she drinks about 8.4 oz of alcohol per week. She reports that she does not use illicit drugs.  Past Medical History  Diagnosis Date  . GERD (gastroesophageal reflux disease)   . Pulmonary infiltrate   . Seasonal allergies   . IBS (irritable bowel syndrome)   . Osteopenia   . History of anemia after childbirth  . Asthma   . Mycobacterium avium-intracellulare complex     Past Surgical History  Procedure Laterality Date  . Carpal tunnel release  2007       . Video bronchoscopy  12/28/2012    Procedure: VIDEO BRONCHOSCOPY WITHOUT FLUORO;  Surgeon: Chesley Mires, MD;  Location: [facility];  Service: Cardiopulmonary;  Laterality: Bilateral;  . Breast surgery  2/07    left breast    Current Outpatient Prescriptions  Medication Sig Dispense Refill  . albuterol (PROAIR HFA) 108 (90 BASE) MCG/ACT inhaler Inhale 2 puffs into the lungs every 6 (six)  hours as needed for wheezing or shortness of breath. 1 Inhaler 5  . Cholecalciferol (VITAMIN D PO) Take by mouth daily.    Marland Kitchen dextromethorphan (DELSYM) 30 MG/5ML liquid Take 60 mg by mouth as needed.    . diphenhydrAMINE (BENADRYL) 25 MG tablet Take 25 mg by mouth every 6 (six) hours as needed.    . fluticasone (FLONASE) 50 MCG/ACT nasal spray Place 1 spray into both nostrils daily. daily 16 g 11  . MAGNESIUM PO Take by mouth daily.    . Multiple Vitamin (MULTIVITAMIN) tablet Take 1 tablet by mouth daily.    . vitamin C (ASCORBIC ACID) 500 MG tablet Take 500 mg by mouth daily.    . phenazopyridine (PYRIDIUM) 100 MG tablet Take 1 tablet (100 mg total) by mouth 3 (three) times daily as needed for pain. (Patient not taking: Reported on 01/07/2015) 15 tablet 0   No current facility-administered medications for this visit.    Family History  Problem Relation Age of Onset  . Lymphoma Mother   . Breast cancer Sister   . Colon cancer Maternal Aunt   . Heart disease      maternal grandparents  . Diabetes Paternal Grandmother   . Hypertension Father   . Bipolar disorder Sister   . Bipolar disorder Maternal Grandmother   . Osteoporosis Paternal Grandmother     ROS:  Pertinent items are noted in HPI.  Otherwise, a comprehensive ROS was negative.  Exam:   BP 116/74 mmHg  Pulse 68  Resp 16  Ht 5' 4.5" (1.638 m)  Wt 136 lb 9.6 oz (61.961 kg)  BMI 23.09 kg/m2  LMP 12/14/2004  Weight:  -6#   Height: 5' 4.5" (163.8 cm)  Ht Readings from Last 3 Encounters:  01/07/15 5' 4.5" (1.638 m)  12/25/14 5' 4.75" (1.645 m)  11/28/14 5' 4.5" (1.638 m)    General appearance: alert, cooperative and appears stated age Head: Normocephalic, without obvious abnormality, atraumatic Neck: no adenopathy, supple, symmetrical, trachea midline and thyroid normal to inspection and palpation Lungs: clear to auscultation bilaterally Breasts: normal appearance, no masses or tenderness Heart: regular rate and  rhythm Abdomen: soft, non-tender; bowel sounds normal; no masses,  no organomegaly Extremities: extremities normal, atraumatic, no cyanosis or edema Skin: Skin color, texture, turgor normal. No rashes or lesions Lymph nodes: Cervical, supraclavicular, and axillary nodes normal. No abnormal inguinal nodes palpated Neurologic: Grossly normal   Pelvic: External genitalia:  no lesions              Urethra:  normal appearing urethra with no masses, tenderness or lesions              Bartholins and Skenes: normal                 Vagina: normal appearing vagina with normal color and discharge, no lesions              Cervix: no lesions              Pap taken: Yes.   Bimanual Exam:  Uterus:  normal size, contour, position, consistency, mobility, non-tender              Adnexa: normal adnexa and no mass, fullness, tenderness               Rectovaginal: Confirms               Anus:  normal sphincter tone, no lesions  Chaperone was present for exam.  A:  Well Woman with normal exam H/O MAC in lungs. Hemoptysis 1/14. CT/bronchoscopy again this year.  Followed yearly by Dr. Halford Chessman, pulmonology.   Family hx of breast cancer, sister. H/O IBS/GERD Thyroid nodule on CT Recurrent vulvar itching Osteopenia  P: Mammogram yearly Would like to postpone Dexa scan to next year pap smear with neg HR HPV 2013.  Pap today. CBC, TSH, Vit D, CMP, Lipids today Declines any vulvar/vaginal estrogen treatments return annually or prn

## 2015-01-08 LAB — VITAMIN D 25 HYDROXY (VIT D DEFICIENCY, FRACTURES): Vit D, 25-Hydroxy: 39 ng/mL (ref 30–100)

## 2015-01-08 LAB — TSH: TSH: 3.154 u[IU]/mL (ref 0.350–4.500)

## 2015-01-09 LAB — IPS PAP TEST WITH REFLEX TO HPV

## 2015-01-09 NOTE — Addendum Note (Signed)
Addended by: Megan Salon on: 01/09/2015 09:55 AM   Modules accepted: Orders, SmartSet

## 2015-01-11 NOTE — Telephone Encounter (Signed)
Patient was seen for aex on 01/07/2015 with Dr.Miller. Please see OV note.  Routing to provider for final review. Patient agreeable to disposition. Will close encounter

## 2015-01-24 ENCOUNTER — Other Ambulatory Visit (INDEPENDENT_AMBULATORY_CARE_PROVIDER_SITE_OTHER): Payer: BC Managed Care – PPO

## 2015-01-24 DIAGNOSIS — R899 Unspecified abnormal finding in specimens from other organs, systems and tissues: Secondary | ICD-10-CM

## 2015-01-24 LAB — COMPLETE METABOLIC PANEL WITH GFR
ALT: 13 U/L (ref 0–35)
AST: 17 U/L (ref 0–37)
Albumin: 4.2 g/dL (ref 3.5–5.2)
Alkaline Phosphatase: 64 U/L (ref 39–117)
BUN: 6 mg/dL (ref 6–23)
CO2: 28 mEq/L (ref 19–32)
Calcium: 9.1 mg/dL (ref 8.4–10.5)
Chloride: 96 mEq/L (ref 96–112)
Creat: 0.66 mg/dL (ref 0.50–1.10)
GFR, Est African American: >89 mL/min
GFR, Est Non African American: >89 mL/min
Glucose, Bld: 69 mg/dL — ABNORMAL LOW (ref 70–99)
Potassium: 4.9 mEq/L (ref 3.5–5.3)
Sodium: 132 mEq/L — ABNORMAL LOW (ref 135–145)
Total Bilirubin: 0.6 mg/dL (ref 0.2–1.2)
Total Protein: 6.8 g/dL (ref 6.0–8.3)

## 2015-02-27 ENCOUNTER — Other Ambulatory Visit: Payer: Self-pay | Admitting: Obstetrics & Gynecology

## 2015-02-27 ENCOUNTER — Other Ambulatory Visit (INDEPENDENT_AMBULATORY_CARE_PROVIDER_SITE_OTHER): Payer: BC Managed Care – PPO

## 2015-02-27 DIAGNOSIS — R899 Unspecified abnormal finding in specimens from other organs, systems and tissues: Secondary | ICD-10-CM

## 2015-02-27 LAB — COMPREHENSIVE METABOLIC PANEL
ALT: 11 U/L (ref 0–35)
AST: 16 U/L (ref 0–37)
Albumin: 4.2 g/dL (ref 3.5–5.2)
Alkaline Phosphatase: 60 U/L (ref 39–117)
BUN: 9 mg/dL (ref 6–23)
CO2: 25 mEq/L (ref 19–32)
Calcium: 8.8 mg/dL (ref 8.4–10.5)
Chloride: 98 mEq/L (ref 96–112)
Creat: 0.69 mg/dL (ref 0.50–1.10)
Glucose, Bld: 65 mg/dL — ABNORMAL LOW (ref 70–99)
Potassium: 4.3 mEq/L (ref 3.5–5.3)
Sodium: 133 mEq/L — ABNORMAL LOW (ref 135–145)
Total Bilirubin: 0.5 mg/dL (ref 0.2–1.2)
Total Protein: 6.5 g/dL (ref 6.0–8.3)

## 2015-03-01 ENCOUNTER — Encounter: Payer: Self-pay | Admitting: Nurse Practitioner

## 2015-03-01 ENCOUNTER — Telehealth: Payer: Self-pay | Admitting: Obstetrics & Gynecology

## 2015-03-01 ENCOUNTER — Ambulatory Visit (INDEPENDENT_AMBULATORY_CARE_PROVIDER_SITE_OTHER): Payer: BC Managed Care – PPO | Admitting: Nurse Practitioner

## 2015-03-01 VITALS — BP 110/70 | HR 80 | Temp 98.1°F | Ht 64.5 in | Wt 136.0 lb

## 2015-03-01 DIAGNOSIS — R300 Dysuria: Secondary | ICD-10-CM

## 2015-03-01 DIAGNOSIS — N760 Acute vaginitis: Secondary | ICD-10-CM | POA: Diagnosis not present

## 2015-03-01 DIAGNOSIS — B9689 Other specified bacterial agents as the cause of diseases classified elsewhere: Secondary | ICD-10-CM

## 2015-03-01 DIAGNOSIS — A499 Bacterial infection, unspecified: Secondary | ICD-10-CM

## 2015-03-01 LAB — POCT URINALYSIS DIPSTICK
Bilirubin, UA: NEGATIVE
Blood, UA: NEGATIVE
Glucose, UA: NEGATIVE
Ketones, UA: NEGATIVE
Nitrite, UA: NEGATIVE
Protein, UA: NEGATIVE
Urobilinogen, UA: NEGATIVE
pH, UA: 6

## 2015-03-01 MED ORDER — PHENAZOPYRIDINE HCL 200 MG PO TABS: 200.0000 mg | ORAL_TABLET | Freq: Three times a day (TID) | ORAL | Status: DC | PRN

## 2015-03-01 MED ORDER — METRONIDAZOLE 0.75 % VA GEL: 1.0000 | Freq: Every day | VAGINAL | Status: DC

## 2015-03-01 MED ORDER — TERCONAZOLE 0.4 % VA CREA: 1.0000 | TOPICAL_CREAM | Freq: Every day | VAGINAL | Status: DC

## 2015-03-01 NOTE — Telephone Encounter (Signed)
Spoke with patient. She states that she is having dysuria and yeast infection symptoms. States she often gets UTI's and yeast together. Feels pain in bladder as well. No fevers or flank pain. Office visit with Milford Cage, FNP scheduled and patient agreeable. Routing to provider for final review. Patient agreeable to disposition. Will close encounter

## 2015-03-01 NOTE — Telephone Encounter (Signed)
Pt thinks she may have a yeast or bladder infection.

## 2015-03-01 NOTE — Patient Instructions (Signed)
Bacterial Vaginosis Bacterial vaginosis is a vaginal infection that occurs when the normal balance of bacteria in the vagina is disrupted. It results from an overgrowth of certain bacteria. This is the most common vaginal infection in women of childbearing age. Treatment is important to prevent complications, especially in pregnant women, as it can cause a premature delivery. CAUSES  Bacterial vaginosis is caused by an increase in harmful bacteria that are normally present in smaller amounts in the vagina. Several different kinds of bacteria can cause bacterial vaginosis. However, the reason that the condition develops is not fully understood. RISK FACTORS Certain activities or behaviors can put you at an increased risk of developing bacterial vaginosis, including:  Having a new sex partner or multiple sex partners.  Douching.  Using an intrauterine device (IUD) for contraception. Women do not get bacterial vaginosis from toilet seats, bedding, swimming pools, or contact with objects around them. SIGNS AND SYMPTOMS  Some women with bacterial vaginosis have no signs or symptoms. Common symptoms include:  Grey vaginal discharge.  A fishlike odor with discharge, especially after sexual intercourse.  Itching or burning of the vagina and vulva.  Burning or pain with urination. DIAGNOSIS  Your health care provider will take a medical history and examine the vagina for signs of bacterial vaginosis. A sample of vaginal fluid may be taken. Your health care provider will look at this sample under a microscope to check for bacteria and abnormal cells. A vaginal pH test may also be done.  TREATMENT  Bacterial vaginosis may be treated with antibiotic medicines. These may be given in the form of a pill or a vaginal cream. A second round of antibiotics may be prescribed if the condition comes back after treatment.  HOME CARE INSTRUCTIONS   Only take over-the-counter or prescription medicines as  directed by your health care provider.  If antibiotic medicine was prescribed, take it as directed. Make sure you finish it even if you start to feel better.  Do not have sex until treatment is completed.  Tell all sexual partners that you have a vaginal infection. They should see their health care provider and be treated if they have problems, such as a mild rash or itching.  Practice safe sex by using condoms and only having one sex partner. SEEK MEDICAL CARE IF:   Your symptoms are not improving after 3 days of treatment.  You have increased discharge or pain.  You have a fever. MAKE SURE YOU:   Understand these instructions.  Will watch your condition.  Will get help right away if you are not doing well or get worse. FOR MORE INFORMATION  Centers for Disease Control and Prevention, Division of STD Prevention: www.cdc.gov/std American Sexual Health Association (ASHA): www.ashastd.org  Document Released: 11/30/2005 Document Revised: 09/20/2013 Document Reviewed: 07/12/2013 ExitCare Patient Information 2015 [employer]. This information is not intended to replace advice given to you by your health care provider. Make sure you discuss any questions you have with your health care provider.  

## 2015-03-01 NOTE — Progress Notes (Signed)
64 y.o.MW Fe G2P2 here with complaint of vaginal symptoms of burning and irritation.    Onset of symptoms 5-6 days ago. Denies new personal products, but does have vaginal dryness.  Using coconut oil prn.  Not SA for several months.  She has been on recent antibiotics for sinusitis with Septra DS.  No STD concerns. Urinary symptoms about 5 days ago with slight increase in urgency without dysuria.    O:Healthy female WDWN Affect: normal, orientation x 3  Exam: alert, no distress - but anxious Abdomen: soft and non tender Lymph node: no enlargement or tenderness Pelvic exam: External genital: normal female BUS: negative Vagina: thin clear discharge noted.  Slight urethral discomfort  Wet Prep results: PH: 5.5; NSS: + clue cells; KOH: negative  A: Normal pelvic exam  Atrophic vaginitis  BV   P: Discussed findings of BV and etiology. Discussed Aveeno or baking soda sitz bath for comfort. Avoid moist clothes or pads for extended period of time. If working out in gym clothes or swim suits for long periods of time change underwear or bottoms of swimsuit if possible. Olive Oil/Coconut Oil use for skin protection prior to activity can be used to external skin.  Rx:  Metrogel vaginal cream hs X 5   Then if gets a yeast vaginitis - she has a back up of Terazol to use.   continue with coconut oil prn  RV prn

## 2015-03-03 LAB — URINE CULTURE: Colony Count: 1000

## 2015-03-04 ENCOUNTER — Telehealth: Payer: Self-pay | Admitting: Nurse Practitioner

## 2015-03-04 NOTE — Telephone Encounter (Signed)
Pt would like to speak with nurse regarding medication side effects/reaction. Pt started metrogel after recent appointment. Pt states shes having fatigue/ gi discomfort/ diarrhea/ 'difficulty keeping warm'

## 2015-03-04 NOTE — Telephone Encounter (Signed)
Spoke with patient. Patient states that she has used metrogel for three days of five. Last night patient began to have extreme fatigue with nausea. Patient woke up this morning with nausea, fatigue, diarrhea, and unable to stay warm. Patient checked temperature and does not have a fever. Denies any vomiting. Denies any exposure to stomach virus or eating food that may have upset stomach. Is having some mild abdominal cramping. "I think I also am getting a yeast infection. I am having some itching. I know she told me to wait to use the cream until after I finished the metrogel but was wondering if I could go ahead and use it." Advised patient will need to speak with Milford Cage, FNP regarding symptoms while using metrgel and return call with further recommendations. Patient is agreeable.

## 2015-03-04 NOTE — Telephone Encounter (Signed)
She needs to stop the Metrogel as she is having a reaction to that.  So tonight she may start on Terazol for the yeast.  She is to take a bath is Aveeno for discomfort and itching.

## 2015-03-05 NOTE — Telephone Encounter (Signed)
Returning call.

## 2015-03-05 NOTE — Telephone Encounter (Signed)
Spoke with patient. Advised patient of message as seen below from Kim Vasquez, [place]. Patient is agreeable and verbalizes understanding. States that she feels better today but is still having some symptoms. Will return call with any further questions or concerns.   Routing to provider for final review. Patient agreeable to disposition. Will close encounter.

## 2015-03-05 NOTE — Telephone Encounter (Signed)
Left message to call Kaitlyn at [phone]. 

## 2015-03-07 ENCOUNTER — Telehealth: Payer: Self-pay | Admitting: Obstetrics & Gynecology

## 2015-03-07 ENCOUNTER — Ambulatory Visit (INDEPENDENT_AMBULATORY_CARE_PROVIDER_SITE_OTHER): Payer: BC Managed Care – PPO | Admitting: Obstetrics & Gynecology

## 2015-03-07 VITALS — BP 124/80 | HR 84 | Resp 12 | Wt 135.2 lb

## 2015-03-07 DIAGNOSIS — N898 Other specified noninflammatory disorders of vagina: Secondary | ICD-10-CM | POA: Diagnosis not present

## 2015-03-07 DIAGNOSIS — R3915 Urgency of urination: Secondary | ICD-10-CM

## 2015-03-07 DIAGNOSIS — R300 Dysuria: Secondary | ICD-10-CM

## 2015-03-07 LAB — POCT URINALYSIS DIPSTICK
Bilirubin, UA: NEGATIVE
Glucose, UA: NEGATIVE
Ketones, UA: NEGATIVE
Leukocytes, UA: NEGATIVE
Nitrite, UA: NEGATIVE
Protein, UA: NEGATIVE
Urobilinogen, UA: NEGATIVE
pH, UA: 6.5

## 2015-03-07 MED ORDER — SOLIFENACIN SUCCINATE 5 MG PO TABS: 5.0000 mg | ORAL_TABLET | Freq: Every day | ORAL | Status: DC

## 2015-03-07 MED ORDER — ESTROGENS, CONJUGATED 0.625 MG/GM VA CREA: TOPICAL_CREAM | VAGINAL | Status: DC

## 2015-03-07 MED ORDER — FESOTERODINE FUMARATE ER 4 MG PO TB24: 4.0000 mg | ORAL_TABLET | Freq: Every day | ORAL | Status: DC

## 2015-03-07 NOTE — Telephone Encounter (Signed)
Pt is having bladder problems and would like to see a doctor.

## 2015-03-07 NOTE — Progress Notes (Signed)
Encounter reviewed by Dr. Josphine Laffey Silva.  

## 2015-03-07 NOTE — Telephone Encounter (Signed)
Spoke with patient. Patient states "I have been in to the office several times for the same thing. I am still having extreme irritation and pain with my bladder. I do not want to just take pyridium for it. I want to see a doctor." Patient is experiencing painful urination with frequency and vaginal irritation. Was last seen on 03/01/2015 with Milford Cage, FNP. Treated with metrogel for BV and terazol at that appointment. Urine culture was negative. Appointment scheduled for today at 2pm with [place]. Patient is agreeable to date and time.  Routing to provider for final review. Patient agreeable to disposition. Will close encounter

## 2015-03-07 NOTE — Progress Notes (Signed)
Subjective:     Patient ID: Kim Vasquez, female   DOB: Mar 19, 1951, 64 y.o.   MRN: [id number]  HPI 64 yo G2P2 MWF here for complaint of several months of vaginal burning, irritation, as well as urinary urgency, and sensation of bladder spasm.  Keeps her up at night.  Feels at times, she needs to voice and can empty bladder well.  Other times, feels like there is very little urine.  Feeling very frustrated with this as she has been seen by mid level providers in my office in December and [place].  Was treated for UTI in December with negative urine culture.  Affirm testing then was negative for yeast, BV, or trich.  At second visit, Afirm testing was repeated and was negative.  Topical olive oil or coconut oil was discussed due to atrophic changes.  Pt was seen by me for AEX between these two appointments but wasn't having symptoms day of visit.    Pt voices concerns of something more "significant" being present.  Reviewed typical presentations for gynecologic and urologic cancers, as this is her major concern.  Pt has negative dip microscopic with AEX 01/07/15.  No vaginal bleeding, pelvic pain, bowel changes.  Review of Systems  All other systems reviewed and are negative.      Objective:   Physical Exam  Constitutional: She appears well-developed and well-nourished.  Abdominal: Soft. Bowel sounds are normal. She exhibits no distension. There is no tenderness. There is no rebound and no guarding.  Genitourinary: Uterus normal. There is no rash, tenderness, lesion or injury on the right labia. There is no rash, tenderness, lesion or injury on the left labia. Cervix exhibits no motion tenderness. Right adnexum displays no mass, no tenderness and no fullness. Left adnexum displays no mass, no tenderness and no fullness. No vaginal discharge (but mild atrophic changes present) found.  Lymphadenopathy:       Right: No inguinal adenopathy present.       Left: No inguinal adenopathy present.  Skin:  Skin is warm and dry.  Psychiatric: She has a normal mood and affect.       Assessment:     Vaginal irritation and burning Atrophic changes Bladder spasms, OAB symptoms     Plan:     Premarin cream 1gm pv twice weekly, topically externally at night for the first week.  HIGHLY encouraged use as I really think this will fix all of her symptoms. Toviaz 4mg  daily.  May need to switch if not on her formulary Urology referral to Dr. Matilde Sprang for pt's peace of mind

## 2015-03-14 ENCOUNTER — Encounter: Payer: Self-pay | Admitting: Obstetrics & Gynecology

## 2015-04-10 ENCOUNTER — Telehealth: Payer: Self-pay | Admitting: Obstetrics & Gynecology

## 2015-04-10 NOTE — Telephone Encounter (Signed)
Spoke with patient. Patient states "I have been in your office quite frequently and want to come in to see a doctor because I do not feel my symptoms have resolved. I think I still have a yeast infection." Is experiencing vaginal itching and slight discharge. "I was seen with urology yesterday because I also keep having UTI's." When asked if she is experiencing any urinary symptoms that may be like what she experiences with a UTI patient became frustrated. "I would not see a urologist if I was not having urinary problems. That is not what I need to talk about with the doctor." Advised I just want to make sure we are scheduling appropriately and in a timely manner regarding symptoms. Want to make sure patient does not have a UTI as they can progress quickly. Patient was tested for UTI at urology appointment yesterday. Appointment scheduled to discuss recurrent yeast made for 4/29 at 10:30am with Dr.SIlva. Patient is agreeable to date and time.  Routing to provider for final review. Patient agreeable to disposition. Will close encounter

## 2015-04-10 NOTE — Telephone Encounter (Signed)
Patient is requesting to see Dr.Miller for her chronic yeast infections. Last seen 03/07/15.

## 2015-04-12 ENCOUNTER — Ambulatory Visit (INDEPENDENT_AMBULATORY_CARE_PROVIDER_SITE_OTHER): Payer: BC Managed Care – PPO | Admitting: Obstetrics and Gynecology

## 2015-04-12 ENCOUNTER — Encounter: Payer: Self-pay | Admitting: Obstetrics and Gynecology

## 2015-04-12 VITALS — BP 110/68 | HR 72 | Temp 97.5°F | Resp 16 | Ht 64.5 in | Wt 139.0 lb

## 2015-04-12 DIAGNOSIS — N763 Subacute and chronic vulvitis: Secondary | ICD-10-CM | POA: Diagnosis not present

## 2015-04-12 DIAGNOSIS — N761 Subacute and chronic vaginitis: Secondary | ICD-10-CM

## 2015-04-12 MED ORDER — NYSTATIN-TRIAMCINOLONE 100000-0.1 UNIT/GM-% EX CREA: 1.0000 "application " | TOPICAL_CREAM | Freq: Two times a day (BID) | CUTANEOUS | Status: DC

## 2015-04-12 NOTE — Progress Notes (Signed)
GYNECOLOGY  VISIT   HPI: 64 y.o.   Married  Caucasian  female   G2P2 with Patient's last menstrual period was 12/14/2004.   here for   reoccuring vaginal yeast infections. Having itching and irritation on vulva and in vagina.  No discharge or odor.  Quite frustrated with the recurrence of her symptoms.  Has had yeast infections since December.  Has been treated for both yeast and nonspecific bacterial infection. Used Terconazole. Using Boric acid capsules on on her own.  Used for a week at at time or a day. This keeps symptoms at [place].   Treated with vaginal estrogen by Dr. Sabra Heck.  Having breast tenderness so using only 1/4 gram every 3 days.   Coconut oil helps with dryness with intercourse.   Normal blood sugar in January.   Has sensitve skin and skin allergies.   Also having problems with her bladder and urethra.  Stopped Vesicare last week. Saw Dr. Matilde Sprang this week.  Had cystoscopy.  Samples of Regaflow and flow is now normal.  Patient struggled with painful urination and difficulty passing her urine.  States this new treatment has changed her life.   GYNECOLOGIC HISTORY: Patient's last menstrual period was 12/14/2004. Contraception:   Husband vasectomy Menopausal hormone therapy: premarin vaginal cream Last pap: 01-07-15 neg Mammo:08-29-14 category c density,birads 1:neg        OB History    Gravida Para Term Preterm AB TAB SAB Ectopic Multiple Living   2 2        2          Patient Active Problem List   Diagnosis Date Noted  . Upper airway cough syndrome 11/30/2013  . Intermittent asthma without complication 16/09/9603  . Chronic cough 01/23/2013  . Localized Bronchiectasis 01/23/2013  . MAI (mycobacterium avium-intracellulare) 12/19/2012    Past Medical History  Diagnosis Date  . GERD (gastroesophageal reflux disease)   . Pulmonary infiltrate   . Seasonal allergies   . IBS (irritable bowel syndrome)   . Osteopenia   . History of anemia after  childbirth  . Asthma   . Mycobacterium avium-intracellulare complex     Past Surgical History  Procedure Laterality Date  . Carpal tunnel release  2007       . Video bronchoscopy  12/28/2012    Procedure: VIDEO BRONCHOSCOPY WITHOUT FLUORO;  Surgeon: Chesley Mires, MD;  Location: [facility];  Service: Cardiopulmonary;  Laterality: Bilateral;  . Breast surgery  2/07    left breast    Current Outpatient Prescriptions  Medication Sig Dispense Refill  . albuterol (PROAIR HFA) 108 (90 BASE) MCG/ACT inhaler Inhale 2 puffs into the lungs every 6 (six) hours as needed for wheezing or shortness of breath. 1 Inhaler 5  . Cholecalciferol (VITAMIN D PO) Take by mouth daily.    Marland Kitchen conjugated estrogens (PREMARIN) vaginal cream 1/2 gram vaginally twice weekly 30 g 6  . diphenhydrAMINE (BENADRYL) 25 MG tablet Take 25 mg by mouth every 6 (six) hours as needed.    . fluticasone (FLONASE) 50 MCG/ACT nasal spray Place 1 spray into both nostrils daily. daily 16 g 11  . MAGNESIUM PO Take by mouth daily.    . Multiple Vitamin (MULTIVITAMIN) tablet Take 1 tablet by mouth daily.    Marland Kitchen UNABLE TO FIND daily. regaflow    . vitamin C (ASCORBIC ACID) 500 MG tablet Take 500 mg by mouth daily.    . phenazopyridine (PYRIDIUM) 200 MG tablet Take 1 tablet (200 mg total) by mouth  3 (three) times daily as needed for pain. (Patient not taking: Reported on 04/12/2015) 30 tablet 0   No current facility-administered medications for this visit.     ALLERGIES: Bacitracin; Doxycycline; Erythromycin; Etodolac; Moxifloxacin; Neosporin; Penicillins; and Polysporin  Family History  Problem Relation Age of Onset  . Lymphoma Mother   . Breast cancer Sister   . Colon cancer Maternal Aunt   . Heart disease      maternal grandparents  . Diabetes Paternal Grandmother   . Hypertension Father   . Bipolar disorder Sister   . Bipolar disorder Maternal Grandmother   . Osteoporosis Paternal Grandmother     History   Social History   . Marital Status: Married    Spouse Name: N/A  . Number of Children: N/A  . Years of Education: N/A   Occupational History  . biology professor [employer]   Social History Main Topics  . Smoking status: Former Smoker -- 1.00 packs/day for 14 years    Types: Cigarettes    Quit date: 12/14/1981  . Smokeless tobacco: Never Used     Comment: 30 + years ago  . Alcohol Use: 8.4 oz/week    14 Standard drinks or equivalent per week  . Drug Use: No  . Sexual Activity:    Partners: Male    Birth Control/ Protection: Other-see comments     Comment: vasectomy   Other Topics Concern  . Not on file   Social History Narrative    ROS:  Pertinent items are noted in HPI.  PHYSICAL EXAMINATION:    BP 110/68 mmHg  Pulse 72  Temp(Src) 97.5 F (36.4 C) (Oral)  Resp 16  Ht 5' 4.5" (1.638 m)  Wt 139 lb (63.05 kg)  BMI 23.50 kg/m2  LMP 12/14/2004     General appearance: alert, cooperative and appears stated age  Pelvic: External genitalia:  Erythema of bilateral labia minora and majora              Urethra:  normal appearing urethra with no masses, tenderness or lesions              Bartholins and Skenes: normal                 Vagina: normal appearing vagina with normal color and discharge, no lesions              Cervix: normal appearance                   Bimanual Exam:  Uterus:  uterus is normal size, shape, consistency and nontender                                      Adnexa: normal adnexa in size, nontender and no masses                                       ASSESSMENT  Chronic vaginitis/vulvitis. Vulvar atrophy.  Voiding dysfunction improved on medication.  PLAN  Discussion of etiologies of vulvovaginitis. Affirm testing done today. Mycolog II.  Discussed avoidance of irritants. Hylafem not likely to give significant advantage over her own self use of boric acid.   Continue vaginal estrogen cream. If symptoms persist consider yeast culture and vulvar biopsy/clobetasol  ointment. Follow up with Dr. Sabra Heck in 2 weeks.  __25_____ minutes face to face time of which over 50% was spent in counseling.   An After Visit Summary was printed and given to the patient.

## 2015-04-13 LAB — WET PREP BY MOLECULAR PROBE
Candida species: NEGATIVE
Gardnerella vaginalis: NEGATIVE
Trichomonas vaginosis: NEGATIVE

## 2015-04-23 ENCOUNTER — Emergency Department (HOSPITAL_COMMUNITY): Payer: BC Managed Care – PPO

## 2015-04-23 ENCOUNTER — Encounter (HOSPITAL_COMMUNITY): Payer: Self-pay | Admitting: Emergency Medicine

## 2015-04-23 ENCOUNTER — Observation Stay (HOSPITAL_COMMUNITY)
Admission: EM | Admit: 2015-04-23 | Discharge: 2015-04-24 | Disposition: A | Discharge status: Home / Self Care | Payer: BC Managed Care – PPO | Attending: Internal Medicine | Admitting: Internal Medicine

## 2015-04-23 DIAGNOSIS — J45909 Unspecified asthma, uncomplicated: Secondary | ICD-10-CM | POA: Insufficient documentation

## 2015-04-23 DIAGNOSIS — R079 Chest pain, unspecified: Secondary | ICD-10-CM

## 2015-04-23 DIAGNOSIS — Z880 Allergy status to penicillin: Secondary | ICD-10-CM | POA: Insufficient documentation

## 2015-04-23 DIAGNOSIS — R0600 Dyspnea, unspecified: Principal | ICD-10-CM | POA: Diagnosis present

## 2015-04-23 DIAGNOSIS — E871 Hypo-osmolality and hyponatremia: Secondary | ICD-10-CM | POA: Insufficient documentation

## 2015-04-23 DIAGNOSIS — M8580 Other specified disorders of bone density and structure, unspecified site: Secondary | ICD-10-CM | POA: Diagnosis not present

## 2015-04-23 DIAGNOSIS — F1099 Alcohol use, unspecified with unspecified alcohol-induced disorder: Secondary | ICD-10-CM | POA: Insufficient documentation

## 2015-04-23 DIAGNOSIS — R0602 Shortness of breath: Secondary | ICD-10-CM | POA: Diagnosis not present

## 2015-04-23 DIAGNOSIS — Z87891 Personal history of nicotine dependence: Secondary | ICD-10-CM | POA: Diagnosis not present

## 2015-04-23 DIAGNOSIS — A310 Pulmonary mycobacterial infection: Secondary | ICD-10-CM | POA: Diagnosis not present

## 2015-04-23 DIAGNOSIS — Z881 Allergy status to other antibiotic agents status: Secondary | ICD-10-CM | POA: Diagnosis not present

## 2015-04-23 DIAGNOSIS — J479 Bronchiectasis, uncomplicated: Secondary | ICD-10-CM

## 2015-04-23 DIAGNOSIS — K219 Gastro-esophageal reflux disease without esophagitis: Secondary | ICD-10-CM | POA: Insufficient documentation

## 2015-04-23 LAB — CBC
HCT: 38 % (ref 36.0–46.0)
Hemoglobin: 13.4 g/dL (ref 12.0–15.0)
MCH: 31.1 pg (ref 26.0–34.0)
MCHC: 35.3 g/dL (ref 30.0–36.0)
MCV: 88.2 fL (ref 78.0–100.0)
Platelets: 262 10*3/uL (ref 150–400)
RBC: 4.31 MIL/uL (ref 3.87–5.11)
RDW: 11.7 % (ref 11.5–15.5)
WBC: 7.2 10*3/uL (ref 4.0–10.5)

## 2015-04-23 LAB — BASIC METABOLIC PANEL
Anion gap: 9 (ref 5–15)
BUN: 7 mg/dL (ref 6–20)
CO2: 27 mmol/L (ref 22–32)
Calcium: 9.1 mg/dL (ref 8.9–10.3)
Chloride: 93 mmol/L — ABNORMAL LOW (ref 101–111)
Creatinine, Ser: 0.68 mg/dL (ref 0.44–1.00)
GFR calc Af Amer: >60 mL/min (ref >60)
GFR calc non Af Amer: >60 mL/min (ref >60)
Glucose, Bld: 104 mg/dL — ABNORMAL HIGH (ref 70–99)
Potassium: 3.7 mmol/L (ref 3.5–5.1)
Sodium: 129 mmol/L — ABNORMAL LOW (ref 135–145)

## 2015-04-23 LAB — I-STAT TROPONIN, ED: Troponin i, poc: 0 ng/mL (ref 0.00–0.08)

## 2015-04-23 MED ORDER — IOHEXOL 350 MG/ML SOLN
100.0000 mL | Freq: Once | INTRAVENOUS | Status: AC | PRN
  Administered 2015-04-23: 100 mL via INTRAVENOUS

## 2015-04-23 NOTE — ED Notes (Signed)
Admitting physician at bedside

## 2015-04-23 NOTE — H&P (Signed)
[facility] History and Physical  Kim Vasquez TFT:[id number] DOB: 04-Feb-1951 DOA: 04/23/2015  Referring physician: Lacretia Leigh, MD PCP: Marjorie Smolder, MD   Chief Complaint: shortness of breath  HPI: Kim Vasquez is a 64 y.o. female presents with shortness of breath.  Patient states that she was diagnosed with MAI about 7-8 years ago. She states that this was an incidental finding at the time it was felt there was no need to treat,. Patient states that she has done well and is being followed by [facility]. She states that she has noted increased shortness of breath with exertion and in addition a rapid heart with activity. She states that she has felt more fatigue also. She states that she has had some cough with small amounts of sputum. She has never had hemoptysis foe the last few years. She states she has had some tightness in her chest. She does use inhalers at home in form of albuterol prn. She states that she has smoked but this was over 30 years ago. She works as a Programme researcher, broadcasting/film/video at [employer]. She was last seen by Dr Halford Chessman in December 2015   Review of Systems:  Constitutional:  No weight loss, night sweats, Fevers, chills, +fatigue.  HEENT:  No headaches,  itching, ear ache, nasal congestion, post nasal drip,  Cardio-vascular:  +chest tightness, Orthopnea, PND, swelling in lower extremities, anasarca, +dizziness, palpitations  GI:  No heartburn, indigestion, abdominal pain, nausea, vomiting, diarrhea Resp:  +shortness of breath with exertion. No coughing up of blood.  Skin:  no rash or lesions.  GU:  no dysuria, change in color of urine, no urgency or frequency Musculoskeletal:  No joint pain or swelling. No decreased range of motion.  Psych:  No change in mood or affect. No depression or anxiety   Past Medical History  Diagnosis Date  . GERD (gastroesophageal reflux disease)   . Pulmonary infiltrate   . Seasonal allergies   . IBS (irritable bowel  syndrome)   . Osteopenia   . History of anemia after childbirth  . Asthma   . Mycobacterium avium-intracellulare complex    Past Surgical History  Procedure Laterality Date  . Carpal tunnel release  2007       . Video bronchoscopy  12/28/2012    Procedure: VIDEO BRONCHOSCOPY WITHOUT FLUORO;  Surgeon: Chesley Mires, MD;  Location: [facility];  Service: Cardiopulmonary;  Laterality: Bilateral;  . Breast surgery  2/07    left breast   Social History:  reports that she quit smoking about 33 years ago. Her smoking use included Cigarettes. She has a 14 pack-year smoking history. She has never used smokeless tobacco. She reports that she drinks about 8.4 oz of alcohol per week. She reports that she does not use illicit drugs.  Allergies  Allergen Reactions  . Bacitracin Itching    Numbness   . Doxycycline     Crushing HA and stiff neck  . Erythromycin Other (See Comments)    Decreased urine output  . Etodolac Other (See Comments)    Dizzy   . Moxifloxacin Other (See Comments)    Stiff neck dizziness   . Neosporin [Neomycin-Bacitracin Zn-Polymyx] Itching  . Polysporin [Bacitracin-Polymyxin B] Itching  . Penicillins Rash    Itching     Family History  Problem Relation Age of Onset  . Lymphoma Mother   . Breast cancer Sister   . Colon cancer Maternal Aunt   . Heart disease      maternal  grandparents  . Diabetes Paternal Grandmother   . Hypertension Father   . Bipolar disorder Sister   . Bipolar disorder Maternal Grandmother   . Osteoporosis Paternal Grandmother     Prior to Admission medications   Medication Sig Start Date End Date Taking? Authorizing Provider  albuterol (PROAIR HFA) 108 (90 BASE) MCG/ACT inhaler Inhale 2 puffs into the lungs every 6 (six) hours as needed for wheezing or shortness of breath. 11/28/14  Yes Chesley Mires, MD  Cholecalciferol (VITAMIN D PO) Take by mouth daily.   Yes Historical Provider, MD  conjugated estrogens (PREMARIN) vaginal cream 1/2 gram  vaginally twice weekly Patient taking differently: Place 1 Applicatorful vaginally every 3 (three) days. 1/2 gram vaginally twice weekly 03/07/15  Yes Megan Salon, MD  diphenhydrAMINE (BENADRYL) 25 MG tablet Take 25 mg by mouth every 6 (six) hours as needed for allergies.    Yes Historical Provider, MD  fluticasone (FLONASE) 50 MCG/ACT nasal spray Place 1 spray into both nostrils daily. daily 11/28/14  Yes Chesley Mires, MD  MAGNESIUM PO Take by mouth daily.   Yes Historical Provider, MD  Multiple Vitamin (MULTIVITAMIN) tablet Take 1 tablet by mouth daily.   Yes Historical Provider, MD  nystatin-triamcinolone (MYCOLOG II) cream Apply 1 application topically 2 (two) times daily. Apply to affected area BID for up to 7 days. 04/12/15  Yes Brook E Yisroel Ramming, MD  omeprazole (PRILOSEC) 20 MG capsule Take 20 mg by mouth daily.   Yes Historical Provider, MD  silodosin (RAPAFLO) 4 MG CAPS capsule Take 4 mg by mouth daily with breakfast.   Yes Historical Provider, MD  vitamin C (ASCORBIC ACID) 500 MG tablet Take 500 mg by mouth daily.   Yes Historical Provider, MD  phenazopyridine (PYRIDIUM) 200 MG tablet Take 1 tablet (200 mg total) by mouth 3 (three) times daily as needed for pain. Patient not taking: Reported on 04/12/2015 03/01/15   Fnp, FNP   Physical Exam: Filed Vitals:   04/23/15 2010  BP: 155/86  Pulse: 104  Temp: 97.6 F (36.4 C)  TempSrc: Oral  Resp: 18  SpO2: 99%    Wt Readings from Last 3 Encounters:  04/12/15 63.05 kg (139 lb)  03/07/15 61.326 kg (135 lb 3.2 oz)  03/01/15 61.689 kg (136 lb)    General:  Appears calm and comfortable Eyes: PERRL, normal lids, irises & conjunctiva ENT: grossly normal hearing, lips & tongue Neck: no LAD, masses or thyromegaly Cardiovascular: RRR, no m/r/g. No LE edema. Respiratory: CTA bilaterally, no w/r/r Abdomen: soft, ntnd Skin: no rash or induration seen on limited exam Musculoskeletal: grossly normal tone BUE/BLE Psychiatric: grossly  normal mood and affect Neurologic: grossly non-focal.          Labs on Admission:  Basic Metabolic Panel:  Recent Labs Lab 04/23/15 2032  NA 129*  K 3.7  CL 93*  CO2 27  GLUCOSE 104*  BUN 7  CREATININE 0.68  CALCIUM 9.1   Liver Function Tests: No results for input(s): AST, ALT, ALKPHOS, BILITOT, PROT, ALBUMIN in the last 168 hours. No results for input(s): LIPASE, AMYLASE in the last 168 hours. No results for input(s): AMMONIA in the last 168 hours. CBC:  Recent Labs Lab 04/23/15 2032  WBC 7.2  HGB 13.4  HCT 38.0  MCV 88.2  PLT 262   Cardiac Enzymes: No results for input(s): CKTOTAL, CKMB, CKMBINDEX, TROPONINI in the last 168 hours.  BNP (last 3 results) No results for input(s): BNP in the last  8760 hours.  ProBNP (last 3 results) No results for input(s): PROBNP in the last 8760 hours.  CBG: No results for input(s): GLUCAP in the last 168 hours.  Radiological Exams on Admission: Dg Chest 2 View  04/23/2015   CLINICAL DATA:  One week history of shortness of breath. One day history of cough  EXAM: CHEST  2 VIEW  COMPARISON:  Chest radiograph December 19, 2012; chest CT December 22, 2012  FINDINGS: There is a 1.1 x 0.8 cm nodular opacity in the periphery of the right mid lung on frontal view. Lungs elsewhere are clear. Heart size and pulmonary vascularity are normal. No adenopathy. No bone lesions.  IMPRESSION: 1.1 x 0.8 cm nodular opacity periphery right mid lung. Advise noncontrast enhanced chest CT to further assess.  No edema or consolidation.   Electronically Signed   By: Lowella Grip III M.D.   On: 04/23/2015 20:49   Ct Angio Chest Pe W/cm &/or Wo Cm  04/23/2015   CLINICAL DATA:  Shortness of breath. Tachycardia an weakness at rest. Cough.  EXAM: CT ANGIOGRAPHY CHEST WITH CONTRAST  TECHNIQUE: Multidetector CT imaging of the chest was performed using the standard protocol during bolus administration of intravenous contrast. Multiplanar CT image reconstructions  and MIPs were obtained to evaluate the vascular anatomy.  CONTRAST:  113mL OMNIPAQUE IOHEXOL 350 MG/ML SOLN  COMPARISON:  12/22/2012  FINDINGS: Technically adequate study with good opacification of the central and segmental pulmonary arteries. No focal filling defects. No evidence of significant pulmonary embolus.  Normal heart size. Normal caliber thoracic aorta. No aortic dissection. Great vessel origins are patent. Calcification of the aorta. No significant lymphadenopathy in the chest. Esophagus is decompressed.  Bronchiectasis in the right upper and right middle lobe with mucous plugging peripherally. Nodular infiltrates with tree-in-bud pattern demonstrated in the superior segment right lower lobe and in the right middle lobe. These changes are more prominent than on the previous study. This is most likely due to inflammatory process such as bronchiolitis or MAI. However, underlying neoplasm with developing nodularity is not excluded and three-month follow-up study is recommended for further evaluation. No pleural effusions. No pneumothorax.  Included portions of the upper abdominal organs are grossly unremarkable. Degenerative changes in the spine.  Review of the MIP images confirms the above findings.  IMPRESSION: No evidence of significant pulmonary embolus. Bronchiectasis, mucous plugging, tree-in-bud infiltrates, and nodular infiltrates in the right lung with progression since previous study. Changes likely to represent infectious bronchiolitis or MAI. Three-month follow-up is suggested to exclude significant pulmonary nodule.   Electronically Signed   By: Lucienne Capers M.D.   On: 04/23/2015 22:12      Assessment/Plan Principal Problem:   Shortness of breath on exertion Active Problems:   MAI (mycobacterium avium-intracellulare)   Localized Bronchiectasis   Dyspnea   1. Shortness of Breath -she states that she is doing much better now. Currently not on oxygen her saturations here were  fine on RA -will admit for observation overnight. Pulmonary to see her in AM -I do not here any wheeze to warrant steroids at this time -will continue with Proventil and added spiriva by nebs  2. MAI -currnetly not on therapy -await pulmonary input in am they have been called  3. Localized bronchiectasis -related to above  4. Hyponatremia -will start on IVF NS for now -repeat labs in am   Code Status: Full Code (must indicate code status--if unknown or must be presumed, indicate so) DVT Prophylaxis:Heparin Family Communication: none(indicate  person spoken with, if applicable, with phone number if by telephone) Disposition Plan: Home (indicate anticipated LOS)  Time spent: 74min  KHAN,SAADAT A [facility] Pager [phone]

## 2015-04-23 NOTE — ED Notes (Signed)
Denies cough

## 2015-04-23 NOTE — ED Provider Notes (Signed)
CSN: [id number]     Arrival date & time 04/23/15  2000 History   First MD Initiated Contact with Patient 04/23/15 2033     Chief Complaint  Patient presents with  . Shortness of Breath  . Chest Pain     (Consider location/radiation/quality/duration/timing/severity/associated sxs/prior Treatment) HPI Comments: Patient here complaining of shortness of breath and tachycardia with weakness which began this morning. Symptoms began at rest and are worse with exertion. Denies any syncope or near-syncopal. No associated diaphoresis. Slight cough noted and patient does have a chronic pulmonary infection. Denies any fever or chills. Denies any leg pain or swelling. No recent travel history. No nausea vomiting or diarrhea. Denies any heavy chest pressure. Symptoms persistent and nothing makes them better. No treatment use prior to arrival. No prior history of same.  Patient is a 64 y.o. female presenting with shortness of breath and chest pain. The history is provided by the patient.  Shortness of Breath Associated symptoms: chest pain   Chest Pain Associated symptoms: shortness of breath     Past Medical History  Diagnosis Date  . GERD (gastroesophageal reflux disease)   . Pulmonary infiltrate   . Seasonal allergies   . IBS (irritable bowel syndrome)   . Osteopenia   . History of anemia after childbirth  . Asthma   . Mycobacterium avium-intracellulare complex    Past Surgical History  Procedure Laterality Date  . Carpal tunnel release  2007       . Video bronchoscopy  12/28/2012    Procedure: VIDEO BRONCHOSCOPY WITHOUT FLUORO;  Surgeon: Chesley Mires, MD;  Location: [facility];  Service: Cardiopulmonary;  Laterality: Bilateral;  . Breast surgery  2/07    left breast   Family History  Problem Relation Age of Onset  . Lymphoma Mother   . Breast cancer Sister   . Colon cancer Maternal Aunt   . Heart disease      maternal grandparents  . Diabetes Paternal Grandmother   . Hypertension  Father   . Bipolar disorder Sister   . Bipolar disorder Maternal Grandmother   . Osteoporosis Paternal Grandmother    History  Substance Use Topics  . Smoking status: Former Smoker -- 1.00 packs/day for 14 years    Types: Cigarettes    Quit date: 12/14/1981  . Smokeless tobacco: Never Used     Comment: 30 + years ago  . Alcohol Use: 8.4 oz/week    14 Standard drinks or equivalent per week   OB History    Gravida Para Term Preterm AB TAB SAB Ectopic Multiple Living   2 2        2      Review of Systems  Respiratory: Positive for shortness of breath.   Cardiovascular: Positive for chest pain.  All other systems reviewed and are negative.     Allergies  Bacitracin; Doxycycline; Erythromycin; Etodolac; Moxifloxacin; Neosporin; Penicillins; and Polysporin  Home Medications   Prior to Admission medications   Medication Sig Start Date End Date Taking? Authorizing Provider  albuterol (PROAIR HFA) 108 (90 BASE) MCG/ACT inhaler Inhale 2 puffs into the lungs every 6 (six) hours as needed for wheezing or shortness of breath. 11/28/14   Chesley Mires, MD  Cholecalciferol (VITAMIN D PO) Take by mouth daily.    Historical Provider, MD  conjugated estrogens (PREMARIN) vaginal cream 1/2 gram vaginally twice weekly 03/07/15   Megan Salon, MD  diphenhydrAMINE (BENADRYL) 25 MG tablet Take 25 mg by mouth every 6 (six) hours as  needed.    Historical Provider, MD  fluticasone (FLONASE) 50 MCG/ACT nasal spray Place 1 spray into both nostrils daily. daily 11/28/14   Chesley Mires, MD  MAGNESIUM PO Take by mouth daily.    Historical Provider, MD  Multiple Vitamin (MULTIVITAMIN) tablet Take 1 tablet by mouth daily.    Historical Provider, MD  nystatin-triamcinolone (MYCOLOG II) cream Apply 1 application topically 2 (two) times daily. Apply to affected area BID for up to 7 days. 04/12/15   [place], MD  phenazopyridine (PYRIDIUM) 200 MG tablet Take 1 tablet (200 mg total) by mouth 3 (three)  times daily as needed for pain. Patient not taking: Reported on 04/12/2015 03/01/15   Antonietta Barcelona, FNP  UNABLE TO FIND daily. regaflow    Historical Provider, MD  vitamin C (ASCORBIC ACID) 500 MG tablet Take 500 mg by mouth daily.    Historical Provider, MD   BP 155/86 mmHg  Pulse 104  Temp(Src) 97.6 F (36.4 C) (Oral)  Resp 18  SpO2 99%  LMP 12/14/2004 Physical Exam  Constitutional: She is oriented to person, place, and time. She appears well-developed and well-nourished.  Non-toxic appearance. No distress.  HENT:  Head: Normocephalic and atraumatic.  Eyes: Conjunctivae, EOM and lids are normal. Pupils are equal, round, and reactive to light.  Neck: Normal range of motion. Neck supple. No tracheal deviation present. No thyroid mass present.  Cardiovascular: Normal rate, regular rhythm and normal heart sounds.  Exam reveals no gallop.   No murmur heard. Pulmonary/Chest: Effort normal and breath sounds normal. No stridor. No respiratory distress. She has no decreased breath sounds. She has no wheezes. She has no rhonchi. She has no rales.  Abdominal: Soft. Normal appearance and bowel sounds are normal. She exhibits no distension. There is no tenderness. There is no rebound and no CVA tenderness.  Musculoskeletal: Normal range of motion. She exhibits no edema or tenderness.  Neurological: She is alert and oriented to person, place, and time. She has normal strength. No cranial nerve deficit or sensory deficit. GCS eye subscore is 4. GCS verbal subscore is 5. GCS motor subscore is 6.  Skin: Skin is warm and dry. No abrasion and no rash noted.  Psychiatric: She has a normal mood and affect. Her speech is normal and behavior is normal.  Nursing note and vitals reviewed.   ED Course  Procedures (including critical care time) Labs Review Labs Reviewed  [place], ED    Imaging Review Dg Chest 2 View  04/23/2015   CLINICAL DATA:  One week history  of shortness of breath. One day history of cough  EXAM: CHEST  2 VIEW  COMPARISON:  Chest radiograph December 19, 2012; chest CT December 22, 2012  FINDINGS: There is a 1.1 x 0.8 cm nodular opacity in the periphery of the right mid lung on frontal view. Lungs elsewhere are clear. Heart size and pulmonary vascularity are normal. No adenopathy. No bone lesions.  IMPRESSION: 1.1 x 0.8 cm nodular opacity periphery right mid lung. Advise noncontrast enhanced chest CT to further assess.  No edema or consolidation.   Electronically Signed   By: Lowella Grip III M.D.   On: 04/23/2015 20:49     EKG Interpretation   Date/Time:  Tuesday Apr 23 2015 20:25:32 EDT Ventricular Rate:  96 PR Interval:  156 QRS Duration: 102 QT Interval:  340 QTC Calculation: 430 R Axis:   53 Text Interpretation:  Sinus  rhythm Probable left atrial enlargement  Nonspecific T abnrm, anterolateral leads t wave changes new when compared  to prior Confirmed by Zenia Resides  MD, Rasa Degrazia ([id number]) on 04/23/2015 8:56:24 PM      MDM   Final diagnoses:  Chest pain    Patient's CT of chest reviewed with the pulmonologist who recommends admission to hospitalist service due to patient's worsening exertional dyspnea.    Lacretia Leigh, MD 04/23/15 [phone]

## 2015-04-23 NOTE — ED Notes (Signed)
Pt states shortness of breath, tachycardia with weakness at rest began the morning, and subsided into day. States it began again a couple hours ago. Denies N/V/D, fever/chills. States she is having some chest tightness and has a "light cough."

## 2015-04-24 ENCOUNTER — Telehealth: Payer: Self-pay | Admitting: *Deleted

## 2015-04-24 DIAGNOSIS — R0600 Dyspnea, unspecified: Secondary | ICD-10-CM

## 2015-04-24 DIAGNOSIS — R053 Chronic cough: Secondary | ICD-10-CM

## 2015-04-24 DIAGNOSIS — R0602 Shortness of breath: Secondary | ICD-10-CM | POA: Diagnosis not present

## 2015-04-24 DIAGNOSIS — J479 Bronchiectasis, uncomplicated: Secondary | ICD-10-CM | POA: Diagnosis not present

## 2015-04-24 DIAGNOSIS — J4521 Mild intermittent asthma with (acute) exacerbation: Secondary | ICD-10-CM | POA: Diagnosis not present

## 2015-04-24 DIAGNOSIS — R05 Cough: Secondary | ICD-10-CM

## 2015-04-24 DIAGNOSIS — A310 Pulmonary mycobacterial infection: Secondary | ICD-10-CM | POA: Diagnosis not present

## 2015-04-24 LAB — COMPREHENSIVE METABOLIC PANEL
ALT: 15 U/L (ref 14–54)
AST: 21 U/L (ref 15–41)
Albumin: 4 g/dL (ref 3.5–5.0)
Alkaline Phosphatase: 70 U/L (ref 38–126)
Anion gap: 8 (ref 5–15)
BUN: 6 mg/dL (ref 6–20)
CO2: 24 mmol/L (ref 22–32)
Calcium: 9.1 mg/dL (ref 8.9–10.3)
Chloride: 102 mmol/L (ref 101–111)
Creatinine, Ser: 0.51 mg/dL (ref 0.44–1.00)
GFR calc Af Amer: >60 mL/min (ref >60)
GFR calc non Af Amer: >60 mL/min (ref >60)
Glucose, Bld: 97 mg/dL (ref 70–99)
Potassium: 4.1 mmol/L (ref 3.5–5.1)
Sodium: 134 mmol/L — ABNORMAL LOW (ref 135–145)
Total Bilirubin: 0.8 mg/dL (ref 0.3–1.2)
Total Protein: 6.9 g/dL (ref 6.5–8.1)

## 2015-04-24 LAB — CBC
HCT: 39.8 % (ref 36.0–46.0)
Hemoglobin: 13.8 g/dL (ref 12.0–15.0)
MCH: 30.8 pg (ref 26.0–34.0)
MCHC: 34.7 g/dL (ref 30.0–36.0)
MCV: 88.8 fL (ref 78.0–100.0)
Platelets: 266 10*3/uL (ref 150–400)
RBC: 4.48 MIL/uL (ref 3.87–5.11)
RDW: 12 % (ref 11.5–15.5)
WBC: 6.1 10*3/uL (ref 4.0–10.5)

## 2015-04-24 LAB — GLUCOSE, CAPILLARY: Glucose-Capillary: 90 mg/dL (ref 70–99)

## 2015-04-24 LAB — TSH: TSH: 4.698 u[IU]/mL — ABNORMAL HIGH (ref 0.350–4.500)

## 2015-04-24 MED ORDER — FLUTICASONE PROPIONATE 50 MCG/ACT NA SUSP
1.0000 | Freq: Every day | NASAL | Status: DC
  Filled 2015-04-24: qty 16

## 2015-04-24 MED ORDER — ADULT MULTIVITAMIN W/MINERALS CH
1.0000 | ORAL_TABLET | Freq: Every day | ORAL | Status: DC
Start: 2015-04-24 — End: 2015-04-24
  Filled 2015-04-24: qty 1

## 2015-04-24 MED ORDER — ALBUTEROL SULFATE (2.5 MG/3ML) 0.083% IN NEBU: 3.0000 mL | INHALATION_SOLUTION | Freq: Four times a day (QID) | RESPIRATORY_TRACT | Status: DC | PRN

## 2015-04-24 MED ORDER — HEPARIN SODIUM (PORCINE) 5000 UNIT/ML IJ SOLN
5000.0000 [IU] | Freq: Three times a day (TID) | INTRAMUSCULAR | Status: DC
  Filled 2015-04-24 (×5): qty 1

## 2015-04-24 MED ORDER — IPRATROPIUM BROMIDE 0.02 % IN SOLN
0.5000 mg | Freq: Four times a day (QID) | RESPIRATORY_TRACT | Status: DC
  Administered 2015-04-24: 0.5 mg via RESPIRATORY_TRACT
  Filled 2015-04-24: qty 2.5

## 2015-04-24 MED ORDER — ESTROGENS, CONJUGATED 0.625 MG/GM VA CREA
1.0000 | TOPICAL_CREAM | VAGINAL | Status: DC
  Filled 2015-04-24: qty 30

## 2015-04-24 MED ORDER — VITAMIN D3 25 MCG (1000 UNIT) PO TABS
1000.0000 [IU] | ORAL_TABLET | Freq: Every day | ORAL | Status: DC
  Filled 2015-04-24: qty 1

## 2015-04-24 MED ORDER — VITAMIN C 500 MG PO TABS
500.0000 mg | ORAL_TABLET | Freq: Every day | ORAL | Status: DC
  Filled 2015-04-24: qty 1

## 2015-04-24 MED ORDER — ACETAMINOPHEN 325 MG PO TABS: 650.0000 mg | ORAL_TABLET | Freq: Four times a day (QID) | ORAL | Status: DC | PRN

## 2015-04-24 MED ORDER — ACETAMINOPHEN 650 MG RE SUPP: 650.0000 mg | Freq: Four times a day (QID) | RECTAL | Status: DC | PRN

## 2015-04-24 MED ORDER — DIPHENHYDRAMINE HCL 25 MG PO CAPS: 25.0000 mg | ORAL_CAPSULE | Freq: Four times a day (QID) | ORAL | Status: DC | PRN

## 2015-04-24 MED ORDER — PANTOPRAZOLE SODIUM 40 MG PO TBEC
40.0000 mg | DELAYED_RELEASE_TABLET | Freq: Every day | ORAL | Status: DC
  Filled 2015-04-24: qty 1

## 2015-04-24 MED ORDER — FOLIC ACID 1 MG PO TABS
1.0000 mg | ORAL_TABLET | Freq: Every day | ORAL | Status: DC
  Filled 2015-04-24: qty 1

## 2015-04-24 MED ORDER — ASPIRIN EC 325 MG PO TBEC
325.0000 mg | DELAYED_RELEASE_TABLET | Freq: Every day | ORAL | Status: DC
  Filled 2015-04-24: qty 1

## 2015-04-24 MED ORDER — VITAMIN B-1 100 MG PO TABS
100.0000 mg | ORAL_TABLET | Freq: Every day | ORAL | Status: DC
  Filled 2015-04-24: qty 1

## 2015-04-24 MED ORDER — ONDANSETRON HCL 4 MG PO TABS: 4.0000 mg | ORAL_TABLET | Freq: Four times a day (QID) | ORAL | Status: DC | PRN

## 2015-04-24 MED ORDER — SODIUM CHLORIDE 0.9 % IV SOLN
INTRAVENOUS | Status: DC
  Administered 2015-04-24: 01:00:00 via INTRAVENOUS

## 2015-04-24 MED ORDER — ONDANSETRON HCL 4 MG/2ML IJ SOLN: 4.0000 mg | Freq: Four times a day (QID) | INTRAMUSCULAR | Status: DC | PRN

## 2015-04-24 MED ORDER — ZOLPIDEM TARTRATE 5 MG PO TABS: 5.0000 mg | ORAL_TABLET | Freq: Every evening | ORAL | Status: DC | PRN

## 2015-04-24 MED ORDER — IPRATROPIUM-ALBUTEROL 0.5-2.5 (3) MG/3ML IN SOLN
3.0000 mL | Freq: Two times a day (BID) | RESPIRATORY_TRACT | Status: DC
  Filled 2015-04-24: qty 3

## 2015-04-24 MED ORDER — TAMSULOSIN HCL 0.4 MG PO CAPS
0.4000 mg | ORAL_CAPSULE | Freq: Every day | ORAL | Status: DC
  Filled 2015-04-24: qty 1

## 2015-04-24 MED ORDER — ALBUTEROL SULFATE (2.5 MG/3ML) 0.083% IN NEBU: 2.5000 mg | INHALATION_SOLUTION | RESPIRATORY_TRACT | Status: DC | PRN

## 2015-04-24 MED ORDER — GUAIFENESIN ER 600 MG PO TB12
600.0000 mg | ORAL_TABLET | Freq: Two times a day (BID) | ORAL | Status: DC
Start: 2015-04-24 — End: 2015-04-24
  Filled 2015-04-24 (×2): qty 1

## 2015-04-24 NOTE — Discharge Summary (Signed)
Physician Discharge Summary  Kim Vasquez SNK:[id number] DOB: [birth year] DOA: 04/23/2015  PCP: Marjorie Smolder, MD  Admit date: 04/23/2015 Discharge date: 04/24/2015  Recommendations for Outpatient Follow-up:  1.  Patient has been seen by pulmonary during this hospital stay. No recommendations for inpatient workup. No new changes in medications.    Discharge Diagnoses:  Principal Problem:   Shortness of breath on exertion Active Problems:   MAI (mycobacterium avium-intracellulare)   Localized Bronchiectasis   Dyspnea    Discharge Condition: stable   Diet recommendation: as tolerated   History of present illness:  64 y.o. female with past history of MAI,  Asthma who presents to [facility] with worsening shortness of breath over past day or so prior to this admission. She also felt associated palpitations. No reports of fevers at home. She did report cough with very scant sputum.  chest x-ray on the admission showed nodular opacity in the right mid lung. CT angiogram of the chest did not reveal pulmonary embolism but it was noted that she has bronchiectasis with mucus plugging and infiltrates in right lung with progression since previous study.  Patient has been seen by pulmonary in consultation with no plans for inpatient workup. Patient will follow-up with pulmonary outpatient.  Hospital Course:  Principal Problem:   Shortness of breath on exertion / Dyspnea / Asthma exacerbation -  Patient shortness of breath likely exacerbation of asthma. -  Respiratory status is stable at this time. -  Pulmonary has seen the patient in consultation with no  Intervention recommended during this hospital stay. This will be outpatient follow-up. - Patient will continue medications per prior home regimen    Signed:  Leisa Lenz, MD  [facility] 04/24/2015, 11:37 AM  Pager #: [phone]  Time spent in minutes: less than 30 minutes  Procedures:  None    Consultations:  Pulmonary   Discharge Exam: Filed Vitals:   04/24/15 0548  BP: 137/78  Pulse: 67  Temp: 97.9 F (36.6 C)  Resp: 18   Filed Vitals:   04/24/15 0010 04/24/15 0043 04/24/15 0218 04/24/15 0548  BP: 138/87 142/89  137/78  Pulse: 78 85  67  Temp: 97.8 F (36.6 C) 98.2 F (36.8 C)  97.9 F (36.6 C)  TempSrc: Oral Oral  Oral  Resp: 18 18  18   Height:  5\' 3"  (1.6 m)    Weight:  60.555 kg (133 lb 8 oz)    SpO2: 97% 98% 98% 99%    General: Pt is alert, follows commands appropriately, not in acute distress Cardiovascular: Regular rate and rhythm, S1/S2 +, no murmurs Respiratory: Clear to auscultation bilaterally, no wheezing, no crackles, no rhonchi Abdominal: Soft, non tender, non distended, bowel sounds +, no guarding Extremities: no edema, no cyanosis, pulses palpable bilaterally DP and PT Neuro: Grossly nonfocal  Discharge Instructions  Discharge Instructions    Call MD for:  difficulty breathing, headache or visual disturbances    Complete by:  As directed      Call MD for:  persistant nausea and vomiting    Complete by:  As directed      Call MD for:  severe uncontrolled pain    Complete by:  As directed      Diet - low sodium heart healthy    Complete by:  As directed      Increase activity slowly    Complete by:  As directed             Medication  List    STOP taking these medications        phenazopyridine 200 MG tablet  Commonly known as:  PYRIDIUM      TAKE these medications        albuterol 108 (90 BASE) MCG/ACT inhaler  Commonly known as:  PROAIR HFA  Inhale 2 puffs into the lungs every 6 (six) hours as needed for wheezing or shortness of breath.     conjugated estrogens vaginal cream  Commonly known as:  PREMARIN  1/2 gram vaginally twice weekly     diphenhydrAMINE 25 MG tablet  Commonly known as:  BENADRYL  Take 25 mg by mouth every 6 (six) hours as needed for allergies.     fluticasone 50 MCG/ACT nasal spray  Commonly  known as:  FLONASE  Place 1 spray into both nostrils daily. daily     MAGNESIUM PO  Take by mouth daily.     multivitamin tablet  Take 1 tablet by mouth daily.     nystatin-triamcinolone cream  Commonly known as:  MYCOLOG II  Apply 1 application topically 2 (two) times daily. Apply to affected area BID for up to 7 days.     omeprazole 20 MG capsule  Commonly known as:  PRILOSEC  Take 20 mg by mouth daily.     silodosin 4 MG Caps capsule  Commonly known as:  RAPAFLO  Take 4 mg by mouth daily with breakfast.     vitamin C 500 MG tablet  Commonly known as:  ASCORBIC ACID  Take 500 mg by mouth daily.     VITAMIN D PO  Take by mouth daily.           Follow-up Information    Follow up with Marjorie Smolder, MD. Schedule an appointment as soon as possible for a visit in 1 week.   Specialty:  Family Medicine   Why:  Follow up appt after recent hospitalization   Contact information:   [address] [place] [address] [phone]        The results of significant diagnostics from this hospitalization (including imaging, microbiology, ancillary and laboratory) are listed below for reference.    Significant Diagnostic Studies: Dg Chest 2 View  04/23/2015   CLINICAL DATA:  One week history of shortness of breath. One day history of cough  EXAM: CHEST  2 VIEW  COMPARISON:  Chest radiograph December 19, 2012; chest CT December 22, 2012  FINDINGS: There is a 1.1 x 0.8 cm nodular opacity in the periphery of the right mid lung on frontal view. Lungs elsewhere are clear. Heart size and pulmonary vascularity are normal. No adenopathy. No bone lesions.  IMPRESSION: 1.1 x 0.8 cm nodular opacity periphery right mid lung. Advise noncontrast enhanced chest CT to further assess.  No edema or consolidation.   Electronically Signed   By: Lowella Grip III M.D.   On: 04/23/2015 20:49   Ct Angio Chest Pe W/cm &/or Wo Cm  04/23/2015   CLINICAL DATA:  Shortness of breath.  Tachycardia an weakness at rest. Cough.  EXAM: CT ANGIOGRAPHY CHEST WITH CONTRAST  TECHNIQUE: Multidetector CT imaging of the chest was performed using the standard protocol during bolus administration of intravenous contrast. Multiplanar CT image reconstructions and MIPs were obtained to evaluate the vascular anatomy.  CONTRAST:  152mL OMNIPAQUE IOHEXOL 350 MG/ML SOLN  COMPARISON:  12/22/2012  FINDINGS: Technically adequate study with good opacification of the central and segmental pulmonary arteries. No focal filling defects.  No evidence of significant pulmonary embolus.  Normal heart size. Normal caliber thoracic aorta. No aortic dissection. Great vessel origins are patent. Calcification of the aorta. No significant lymphadenopathy in the chest. Esophagus is decompressed.  Bronchiectasis in the right upper and right middle lobe with mucous plugging peripherally. Nodular infiltrates with tree-in-bud pattern demonstrated in the superior segment right lower lobe and in the right middle lobe. These changes are more prominent than on the previous study. This is most likely due to inflammatory process such as bronchiolitis or MAI. However, underlying neoplasm with developing nodularity is not excluded and three-month follow-up study is recommended for further evaluation. No pleural effusions. No pneumothorax.  Included portions of the upper abdominal organs are grossly unremarkable. Degenerative changes in the spine.  Review of the MIP images confirms the above findings.  IMPRESSION: No evidence of significant pulmonary embolus. Bronchiectasis, mucous plugging, tree-in-bud infiltrates, and nodular infiltrates in the right lung with progression since previous study. Changes likely to represent infectious bronchiolitis or MAI. Three-month follow-up is suggested to exclude significant pulmonary nodule.   Electronically Signed   By: Lucienne Capers M.D.   On: 04/23/2015 22:12    Microbiology: No results found for this  or any previous visit (from the past 240 hour(s)).   Labs: Basic Metabolic Panel:  Recent Labs Lab 04/23/15 2032 04/24/15 0519  NA 129* 134*  K 3.7 4.1  CL 93* 102  CO2 27 24  GLUCOSE 104* 97  BUN 7 6  CREATININE 0.68 0.51  CALCIUM 9.1 9.1   Liver Function Tests:  Recent Labs Lab 04/24/15 0519  AST 21  ALT 15  ALKPHOS 70  BILITOT 0.8  PROT 6.9  ALBUMIN 4.0   No results for input(s): LIPASE, AMYLASE in the last 168 hours. No results for input(s): AMMONIA in the last 168 hours. CBC:  Recent Labs Lab 04/23/15 2032 04/24/15 0519  WBC 7.2 6.1  HGB 13.4 13.8  HCT 38.0 39.8  MCV 88.2 88.8  PLT 262 266   Cardiac Enzymes: No results for input(s): CKTOTAL, CKMB, CKMBINDEX, TROPONINI in the last 168 hours. BNP: BNP (last 3 results) No results for input(s): BNP in the last 8760 hours.  ProBNP (last 3 results) No results for input(s): PROBNP in the last 8760 hours.  CBG:  Recent Labs Lab 04/24/15 0742  GLUCAP 90

## 2015-04-24 NOTE — Care Management Note (Signed)
Case Management Note  Patient Details  Name: TIMBERLY YOTT MRN: [id number] Date of Birth: 20-Dec-1950  Subjective/Objective:     64 yo female admitted with COPD exacerbation               Action/Plan:  Received consult for COPD Gold program. Patient has PCP and is an active patient of [employer]. Patient has no needs or concerns and feels well connected.  Expected Discharge Date:                  Expected Discharge Plan:  Home/Self Care  In-House Referral:     Discharge planning Services  CM Consult  Post Acute Care Choice:  NA Choice offered to:  NA  DME Arranged:    DME Agency:     HH Arranged:    HH Agency:     Status of Service:  In process, will continue to follow  Medicare Important Message Given:    Date Medicare IM Given:    Medicare IM give by:    Date Additional Medicare IM Given:    Additional Medicare Important Message give by:     If discussed at [place] of Stay Meetings, dates discussed:    Additional Comments:  Scot Dock, RN 04/24/2015, 10:30 AM

## 2015-04-24 NOTE — Discharge Instructions (Signed)

## 2015-04-24 NOTE — Progress Notes (Signed)
Initial Nutrition Assessment  DOCUMENTATION CODES:  Not applicable  INTERVENTION:  Other (Comment) (Encourage PO once diet is advanced)  NUTRITION DIAGNOSIS:  Inadequate oral intake related to inability to eat as evidenced by NPO status.  GOAL:  Patient will meet greater than or equal to 90% of their needs  MONITOR:  Diet advancement, Labs, Weight trends, I & O's  REASON FOR ASSESSMENT:  Consult Assessment of nutrition requirement/status  ASSESSMENT: Pt is a 64 y.o. female presents with shortness of breath. Patient states that she was diagnosed with MAI about 7-8 years ago. She states that she has noted increased shortness of breath with exertion and in addition a rapid heart with activity. She states that she has felt more fatigue also. She states that she has had some cough with small amounts of sputum. She has never had hemoptysis foe the last few years. She states she has had some tightness in her chest. She does use inhalers at home in form of albuterol prn. She states that she has smoked but this was over 30 years ago. She works as a Programme researcher, broadcasting/film/video.  Pt has just been seen by her MD and states that she should be going home today.  Per pt her appetite has been normal and she hasn't had any weight loss. Her usual body weight is 135 Lb. She takes vitamins and probiotics at home and stays active whenever possible. Pt has lactose intolerance and does not consume dairy products. She denies any use or need for nutritional supplements and states that she can fulfill her nutritional needs by food alone. No intervention is warranted at this time. Will continue to monitor if pt remains hospitalized.   Labs reviewed: Na 134  Height:  Ht Readings from Last 1 Encounters:  04/24/15 5\' 3"  (1.6 m)    Weight:  Wt Readings from Last 1 Encounters:  04/24/15 133 lb 8 oz (60.555 kg)    Ideal Body Weight:  52 kg  Wt Readings from Last 10 Encounters:  04/24/15 133 lb 8 oz (60.555  kg)  04/12/15 139 lb (63.05 kg)  03/07/15 135 lb 3.2 oz (61.326 kg)  03/01/15 136 lb (61.689 kg)  01/07/15 136 lb 9.6 oz (61.961 kg)  12/25/14 134 lb (60.782 kg)  11/28/14 136 lb 9.6 oz (61.961 kg)  11/13/14 139 lb 9.6 oz (63.322 kg)  11/29/13 143 lb (64.864 kg)  10/13/13 142 lb 12.8 oz (64.774 kg)    BMI:  Body mass index is 23.65 kg/(m^2). Normal  Estimated Nutritional Needs:  Kcal:  1600 - 1800  Protein:  65 - 75 g  Fluid:  1.7 L  Skin:  Reviewed, no issues  Diet Order:  Diet NPO time specified  EDUCATION NEEDS:  No education needs identified at this time  No intake or output data in the 24 hours ending 04/24/15 0820  Last BM:  PTA  Teri Legacy A. [facility] Dietetic Intern Pager: [phone] 04/24/2015 8:27 AM

## 2015-04-24 NOTE — Telephone Encounter (Signed)
-----   Message from Chesley Mires, MD sent at 04/24/2015 12:03 PM EDT ----- Kim Vasquez is in [facility] and will be getting d/c home later today.  Please call her tomorrow to arrange for sputum specimen collection.  This needs to be sent for regular culture, AFB culture, and fungal culture.  She needs to have ROV scheduled with me in 3 to 4 weeks.  Thanks.

## 2015-04-24 NOTE — Progress Notes (Signed)
PT Cancellation Note  Patient Details Name: Kim Vasquez MRN: [id number] DOB: 1951/08/31   Cancelled Treatment:     PT eval order received but deferred 2* strict bed rest order.  Will follow and initiate PT following advancement in activity order.   Nashae Maudlin 04/24/2015, 7:52 AM

## 2015-04-24 NOTE — Progress Notes (Signed)
Discharge instructions and medications reviewed with patient. Patient verbalizes understanding and has no questions at this time. Patient discharged home.

## 2015-04-24 NOTE — Consult Note (Signed)
Educated patient on use of nebulizer for her breathing treatments and the frequency of treatments. Patient showed interest and understanding of treatment.

## 2015-04-24 NOTE — Progress Notes (Signed)
OT Cancellation Note  Patient Details Name: Kim Vasquez MRN: [id number] DOB: 03/18/1951   Cancelled Treatment:    Reason Eval/Treat Not Completed: Other (comment).  Pt is on strict bedrest.  Will see when activity is upgraded.  Stephanos Fan 04/24/2015, 7:46 AM  Lesle Chris, OTR/L [phone] 04/24/2015

## 2015-04-24 NOTE — Consult Note (Signed)
Name: Kim Vasquez MRN: [id number] DOB: [birth year]    ADMISSION DATE:  04/23/2015 CONSULTATION DATE:  04/24/2015  REFERRING MD :  [facility]  CHIEF COMPLAINT:  Short of breath  BRIEF PATIENT DESCRIPTION:  64 yo female presented with dyspnea, cough.  She has hx of MAI with regional bronchiectasis, asthma, and upper airway cough syndrome.  SIGNIFICANT EVENTS   STUDIES:  02/08/08 CT chest >> RML tree in bud, mild BTX RML 05/29/08 Bronchoscopy >> scarring RML, cytology negative; MAI, Penicillium, and Aspergillus [place] in BAL 12/22/12 CT chest >> RUL and RML tree in bud, BTX RML 12/28/12 Bronchoscopy >> MAI in BAL, cytology negative PFT 03/01/13 >> FEV1 2.92 (136%), FEV1% 85, TLC 5.91 (124%), DLCO 117%, borderline BD. 04/23/15 CT chest >> BTX RUL/RML, nodular infiltrates RLL/RML increased in size   HISTORY OF PRESENT ILLNESS:   She is well known to me.  She has hx of MAI with regional BTX and micronodules.  She has monitored clinically and had decided against antimicrobial therapy.  She has also been seen previous by Dr. Johnnye Sima with ID.  She developed sudden onset of shortness of breath yesterday.  She felt tight in her chest and had some cough.  She tried using her inhaler and this helped.  She was having more trouble with her allergies.  She was also being treated for urinary infection.  She was concerned that she was having a reaction to her antibiotic >> she called her physician and was advised to come to ER.    In ER she had CT chest which showed progression of nodules in RML and RLL.  She has been feeling more fatigued, and gets intermittent cough with clear sputum.  She denies fever, sweats, weight loss, or hemoptysis.  PAST MEDICAL HISTORY :   has a past medical history of GERD (gastroesophageal reflux disease); Pulmonary infiltrate; Seasonal allergies; IBS (irritable bowel syndrome); Osteopenia; History of anemia (after childbirth); Asthma; and Mycobacterium avium-intracellulare  complex.  has past surgical history that includes Carpal tunnel release (2007); Video bronchoscopy (12/28/2012); and Breast surgery (2/07). Prior to Admission medications   Medication Sig Start Date End Date Taking? Authorizing Provider  albuterol (PROAIR HFA) 108 (90 BASE) MCG/ACT inhaler Inhale 2 puffs into the lungs every 6 (six) hours as needed for wheezing or shortness of breath. 11/28/14  Yes Chesley Mires, MD  Cholecalciferol (VITAMIN D PO) Take by mouth daily.   Yes Historical Provider, MD  conjugated estrogens (PREMARIN) vaginal cream 1/2 gram vaginally twice weekly Patient taking differently: Place 1 Applicatorful vaginally every 3 (three) days. 1/2 gram vaginally twice weekly 03/07/15  Yes Megan Salon, MD  diphenhydrAMINE (BENADRYL) 25 MG tablet Take 25 mg by mouth every 6 (six) hours as needed for allergies.    Yes Historical Provider, MD  fluticasone (FLONASE) 50 MCG/ACT nasal spray Place 1 spray into both nostrils daily. daily 11/28/14  Yes Chesley Mires, MD  MAGNESIUM PO Take by mouth daily.   Yes Historical Provider, MD  Multiple Vitamin (MULTIVITAMIN) tablet Take 1 tablet by mouth daily.   Yes Historical Provider, MD  nystatin-triamcinolone (MYCOLOG II) cream Apply 1 application topically 2 (two) times daily. Apply to affected area BID for up to 7 days. 04/12/15  Yes Brook E Yisroel Ramming, MD  omeprazole (PRILOSEC) 20 MG capsule Take 20 mg by mouth daily.   Yes Historical Provider, MD  silodosin (RAPAFLO) 4 MG CAPS capsule Take 4 mg by mouth daily with breakfast.   Yes Historical Provider,  MD  vitamin C (ASCORBIC ACID) 500 MG tablet Take 500 mg by mouth daily.   Yes Historical Provider, MD  phenazopyridine (PYRIDIUM) 200 MG tablet Take 1 tablet (200 mg total) by mouth 3 (three) times daily as needed for pain. Patient not taking: Reported on 04/12/2015 03/01/15   Kem Boroughs, FNP   Allergies  Allergen Reactions  . Bacitracin Itching    Numbness   . Doxycycline     Crushing HA  and stiff neck  . Erythromycin Other (See Comments)    Decreased urine output  . Etodolac Other (See Comments)    Dizzy   . Moxifloxacin Other (See Comments)    Stiff neck dizziness   . Neosporin [Neomycin-Bacitracin Zn-Polymyx] Itching  . Polysporin [Bacitracin-Polymyxin B] Itching  . Penicillins Rash    Itching     FAMILY HISTORY:  family history includes Bipolar disorder in her maternal grandmother and sister; Breast cancer in her sister; Colon cancer in her maternal aunt; Diabetes in her paternal grandmother; Heart disease in an other family member; Hypertension in her father; Lymphoma in her mother; Osteoporosis in her paternal grandmother. SOCIAL HISTORY:  reports that she quit smoking about 33 years ago. Her smoking use included Cigarettes. She has a 14 pack-year smoking history. She has never used smokeless tobacco. She reports that she drinks about 8.4 oz of alcohol per week. She reports that she does not use illicit drugs.  REVIEW OF SYSTEMS:   Negative except above  SUBJECTIVE:   VITAL SIGNS: Temp:  [97.6 F (36.4 C)-98.2 F (36.8 C)] 97.9 F (36.6 C) (05/11 0548) Pulse Rate:  [67-104] 67 (05/11 0548) Resp:  [13-22] 18 (05/11 0548) BP: (137-155)/(78-94) 137/78 mmHg (05/11 0548) SpO2:  [95 %-99 %] 99 % (05/11 0548) Weight:  [133 lb 8 oz (60.555 kg)] 133 lb 8 oz (60.555 kg) (05/11 0043)  PHYSICAL EXAMINATION: General: pleasant Neuro:  Normal strength HEENT:  Wears glasses, no LAN Cardiovascular:  regular Lungs:  No wheeze/rales Abdomen:  Soft, non tender Musculoskeletal:  No edema Skin:  No rashes   Recent Labs Lab 04/23/15 2032 04/24/15 0519  NA 129* 134*  K 3.7 4.1  CL 93* 102  CO2 27 24  BUN 7 6  CREATININE 0.68 0.51  GLUCOSE 104* 97    Recent Labs Lab 04/23/15 2032 04/24/15 0519  HGB 13.4 13.8  HCT 38.0 39.8  WBC 7.2 6.1  PLT 262 266   Dg Chest 2 View  04/23/2015   CLINICAL DATA:  One week history of shortness of breath. One day history  of cough  EXAM: CHEST  2 VIEW  COMPARISON:  Chest radiograph December 19, 2012; chest CT December 22, 2012  FINDINGS: There is a 1.1 x 0.8 cm nodular opacity in the periphery of the right mid lung on frontal view. Lungs elsewhere are clear. Heart size and pulmonary vascularity are normal. No adenopathy. No bone lesions.  IMPRESSION: 1.1 x 0.8 cm nodular opacity periphery right mid lung. Advise noncontrast enhanced chest CT to further assess.  No edema or consolidation.   Electronically Signed   By: Lowella Grip III M.D.   On: 04/23/2015 20:49   Ct Angio Chest Pe W/cm &/or Wo Cm  04/23/2015   CLINICAL DATA:  Shortness of breath. Tachycardia an weakness at rest. Cough.  EXAM: CT ANGIOGRAPHY CHEST WITH CONTRAST  TECHNIQUE: Multidetector CT imaging of the chest was performed using the standard protocol during bolus administration of intravenous contrast. Multiplanar CT image reconstructions and MIPs  were obtained to evaluate the vascular anatomy.  CONTRAST:  173mL OMNIPAQUE IOHEXOL 350 MG/ML SOLN  COMPARISON:  12/22/2012  FINDINGS: Technically adequate study with good opacification of the central and segmental pulmonary arteries. No focal filling defects. No evidence of significant pulmonary embolus.  Normal heart size. Normal caliber thoracic aorta. No aortic dissection. Great vessel origins are patent. Calcification of the aorta. No significant lymphadenopathy in the chest. Esophagus is decompressed.  Bronchiectasis in the right upper and right middle lobe with mucous plugging peripherally. Nodular infiltrates with tree-in-bud pattern demonstrated in the superior segment right lower lobe and in the right middle lobe. These changes are more prominent than on the previous study. This is most likely due to inflammatory process such as bronchiolitis or MAI. However, underlying neoplasm with developing nodularity is not excluded and three-month follow-up study is recommended for further evaluation. No pleural  effusions. No pneumothorax.  Included portions of the upper abdominal organs are grossly unremarkable. Degenerative changes in the spine.  Review of the MIP images confirms the above findings.  IMPRESSION: No evidence of significant pulmonary embolus. Bronchiectasis, mucous plugging, tree-in-bud infiltrates, and nodular infiltrates in the right lung with progression since previous study. Changes likely to represent infectious bronchiolitis or MAI. Three-month follow-up is suggested to exclude significant pulmonary nodule.   Electronically Signed   By: Lucienne Capers M.D.   On: 04/23/2015 22:12    ASSESSMENT / PLAN:  Acute dyspnea likely from acute asthma exacerbation in setting of allergic rhinitis. She is reluctant to use inhaled or systemic steroids, and was intolerant of leukotriene inhibitors previously.  Her symptoms have improved. Plan: - continue albuterol prn - continue allergy regimen  Hx of MAI with regional bronchiectasis.   She has progression of nodular infiltrates on CT chest compared to 2014.  This likely represents progression of MAI. Plan: - will arrange for sputum specimen sampling as outpt - she will need f/u CT chest in 3 months - if sputum specimen non diagnostic, then she will need bronchoscopy  She is okay for d/c home from pulmonary standpoint.  I will have my office contact her to arrange for sputum specimen collection and outpt f/u visit.  Updated her husband about plan.  Chesley Mires, MD [facility] Pulmonary/Critical Care 04/24/2015, 12:03 PM Pager:  [phone] After 3pm call: [phone]

## 2015-04-25 LAB — HEMOGLOBIN A1C
Hgb A1c MFr Bld: 5.1 % (ref 4.8–5.6)
Mean Plasma Glucose: 100 mg/dL

## 2015-04-25 NOTE — Telephone Encounter (Signed)
Spoke with patient - aware that Dr Halford Chessman would like a sputum culture dropped off soon.  Pt states that Monday would work best for her as she wont be abl to tomorrow. Pt aware to take straight to lab.  Pt scheduled for 3-4 week f/u with VS 05/10/15 at 2pm Nothing further needed.  Will hold in my boxy to ensure sputum dropped off

## 2015-05-01 ENCOUNTER — Other Ambulatory Visit: Payer: BC Managed Care – PPO

## 2015-05-01 DIAGNOSIS — R053 Chronic cough: Secondary | ICD-10-CM

## 2015-05-01 DIAGNOSIS — R0600 Dyspnea, unspecified: Secondary | ICD-10-CM

## 2015-05-01 DIAGNOSIS — R05 Cough: Secondary | ICD-10-CM

## 2015-05-01 NOTE — Telephone Encounter (Signed)
Spoke with patient this morning, has not been able to get a sample to bring in. Will bring this in today.  Will hold in my box to follow up on.

## 2015-05-03 ENCOUNTER — Ambulatory Visit (INDEPENDENT_AMBULATORY_CARE_PROVIDER_SITE_OTHER): Payer: BC Managed Care – PPO | Admitting: Obstetrics & Gynecology

## 2015-05-03 VITALS — BP 98/60 | HR 68 | Resp 16 | Wt 134.8 lb

## 2015-05-03 DIAGNOSIS — L292 Pruritus vulvae: Secondary | ICD-10-CM

## 2015-05-03 DIAGNOSIS — N763 Subacute and chronic vulvitis: Secondary | ICD-10-CM | POA: Diagnosis not present

## 2015-05-03 NOTE — Progress Notes (Signed)
Subjective:     Patient ID: Kim Vasquez, female   DOB: January 16, 1951, 64 y.o.   MRN: [id number]  HPI 64 yo G2P2 MWF with hx of recurrent vulvar and occasionally vaginal itching that has been diagnosed as a recurrent yeast infection.  Since mid March, she has seen me, Dr. Quincy Simmonds, and NP [address] for similar complaints.  Pt reports biggest issue is external itching, especially along her episiotomy scar.  She does have associated urgency but this seems to have improved.  Pt has had negative affirm testing x 2.  Olive oil/coconut oil was recommended.  Pt denies vaginal bleeding or discharge.  She denies odor.  She has no bowel habit changes, fever, or urinary complaints.  Vaginal estrogen seems to have helped the bladder symptoms the most.  Pt using only 1/4 gm every three days due to breast tenderness.    Reports vaginal dryness is improved with coconut oil.   Review of Systems  All other systems reviewed and are negative.      Objective:   Physical Exam  Constitutional: She is oriented to person, place, and time. She appears well-developed and well-nourished.  Genitourinary: Vagina normal.    There is no rash, tenderness, lesion or injury on the right labia. There is no rash, tenderness, lesion or injury on the left labia. No tenderness or bleeding in the vagina. No foreign body around the vagina. No vaginal discharge found.  Lymphadenopathy:       Right: No inguinal adenopathy present.       Left: No inguinal adenopathy present.  Neurological: She is alert and oriented to person, place, and time.  Skin: Skin is warm and dry.  Psychiatric: She has a normal mood and affect.   Vulvar biopsy:  After pt showed me most irritated locations, left area just at edge of labium majora was cleansed with Betadine x 3.  Then 0.5cc 1% Lidocaine plain instilled.  44mm punch biopsy obtained.  Silver nitrate used for hemostasis.  Pt tolerated procedure well.  No dressing applied.    Assessment:       Chronic vulvitis Vulvar itching     Plan:     Vulvar biopsy pending.  Instructions for care provided.   Pt requests that she be called on Thursday if possible and not Wednesday as she will be at a conference.

## 2015-05-04 LAB — RESPIRATORY CULTURE OR RESPIRATORY AND SPUTUM CULTURE
Culture: NORMAL
Organism ID, Bacteria: NORMAL

## 2015-05-09 ENCOUNTER — Telehealth: Payer: Self-pay | Admitting: Emergency Medicine

## 2015-05-09 ENCOUNTER — Encounter: Payer: Self-pay | Admitting: Obstetrics & Gynecology

## 2015-05-09 MED ORDER — HYDROXYZINE HCL 25 MG PO TABS: ORAL_TABLET | ORAL | Status: DC

## 2015-05-09 NOTE — Telephone Encounter (Signed)
-----   Message from Megan Salon, MD sent at 05/09/2015  2:42 PM EDT ----- Please inform pt vulvar biopsy was not completely diagnostic but findings are most consistent with lichen simplex chronicus.  This is a vulvar condition where something topical is causing the itching--pad, soap, detergent, fragrance--all the things we've discussed.  The treatment is typically Vaseline or another emollient barrier (applied daily) and stopping the itching was an antihistamine, like Atarax 25mg  1/2-1 tab qhs.  Can take up to three times daily but this will make her sleepy so most just take it at night.

## 2015-05-09 NOTE — Telephone Encounter (Signed)
Spoke with patient and message from Dr. Sabra Heck given. Patient verbalized understanding of results and plan of care given by Dr. Sabra Heck. Prescription sent to pharmacy of choice and instructions given.  Patient requests to schedule follow up appointment and appointment is made for 06/28/15. Patient states she will call back if decides she doesn't need the appointment or call earlier if any concerning symptoms. Routing to provider for final review. Patient agreeable to disposition. Will close encounter.

## 2015-05-10 ENCOUNTER — Encounter: Payer: Self-pay | Admitting: Pulmonary Disease

## 2015-05-10 ENCOUNTER — Ambulatory Visit (INDEPENDENT_AMBULATORY_CARE_PROVIDER_SITE_OTHER): Payer: BC Managed Care – PPO | Admitting: Pulmonary Disease

## 2015-05-10 VITALS — BP 122/84 | HR 81 | Ht 64.5 in | Wt 137.0 lb

## 2015-05-10 DIAGNOSIS — A310 Pulmonary mycobacterial infection: Secondary | ICD-10-CM

## 2015-05-10 DIAGNOSIS — R918 Other nonspecific abnormal finding of lung field: Secondary | ICD-10-CM | POA: Diagnosis not present

## 2015-05-10 NOTE — Patient Instructions (Signed)
Will call with results of sputum samples Will schedule CT chest for August 2016 Follow up after CT chest done in August 2016

## 2015-05-10 NOTE — Progress Notes (Signed)
Chief Complaint  Patient presents with  . Hospitalization Follow-up    HFU from [facility] 04/23/15 admission. Pt states that she has been feeling fine since d/c.     History of Present Illness: Kim Vasquez is a 64 y.o. female former smoker with chronic cough, localized BTX, and MAI.  She has been doing okay since she got out of the hospital.  She felt her breathing was much better when she was at the beach.  She has been taking claritin and benadryl for allergies.  She uses albuterol a few times per week.   She denies fever, sweats, hemoptysis, or chest pain.  Tests: 02/08/08 CT chest >> RML tree in bud, mild BTX RML 05/29/08 Bronchoscopy >> scarring RML, cytology negative; MAI, Penicillium, and Aspergillus [place] in BAL 12/22/12 CT chest >> RUL and RML tree in bud, BTX RML 12/28/12 Bronchoscopy >> MAI in BAL, cytology negative PFT 03/01/13 >> FEV1 2.92 (136%), FEV1% 85, TLC 5.91 (124%), DLCO 117%, borderline BD. 04/23/15 CT chest >> BTX RUL/RML, nodular infiltrates RLL/RML increased in size  PMHx >> GERD, IBS  PSHx, Medications, Allergies, Fhx, Shx reviewed.  Physical Exam: Blood pressure 122/84, pulse 81, height 5' 4.5" (1.638 m), weight 137 lb (62.143 kg), last menstrual period 12/14/2004, SpO2 98 %. Body mass index is 23.16 kg/(m^2).  General - No distress ENT - No sinus tenderness, no oral exudate, no LAN Cardiac - s1s2 regular, no murmur Chest - No wheeze/rales/dullness, good air entry, normal respiratory excursion Back - No focal tenderness Abd - Soft, non-tender Ext - No edema Neuro - Normal strength Skin - No rashes Psych - Normal mood, and behavior   Assessment/Plan:  Chronic cough asthma, regional bronchiectasis, and post-nasal drip. She is reluctant to use ICS, and was not able to tolerate leukotriene inhibitors. Plan: - continue prn albuterol, claritin, and benadryl - discussed proper indications for using albuterol  Hx of MAI >> CT chest from May 2016 showed  progression of nodular infiltrates compared to 2014.   Plan: - f/u sputum samples - repeat CT chest in August 2016 - defer referral back to ID for now   Chesley Mires, MD [facility] Pager:  [phone] 05/10/2015, 2:20 PM

## 2015-05-16 ENCOUNTER — Telehealth: Payer: Self-pay | Admitting: Pulmonary Disease

## 2015-05-16 DIAGNOSIS — A310 Pulmonary mycobacterial infection: Secondary | ICD-10-CM

## 2015-05-16 NOTE — Telephone Encounter (Signed)
Spoke with pt over phone.  Will arrange for referral back to Dr. Johnnye Sima with ID.

## 2015-05-16 NOTE — Telephone Encounter (Signed)
Pt has called back stating that she seen where she received a call from our office but states she has not received a VM from you. She is asking if you can call her back or if you can give [place] information to give her in regards to her results. States she can be contacted at [phone].

## 2015-05-16 NOTE — Telephone Encounter (Signed)
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  Will need to determine whether she should have f/u with Dr. Johnnye Sima with ID.  Advised her to call back to discuss in more detail.

## 2015-05-27 LAB — FUNGUS CULTURE W SMEAR: Smear Result: NONE SEEN

## 2015-06-10 LAB — AFB CULTURE WITH SMEAR (NOT AT ARMC): Acid Fast Smear: NONE SEEN

## 2015-06-28 ENCOUNTER — Ambulatory Visit (INDEPENDENT_AMBULATORY_CARE_PROVIDER_SITE_OTHER): Payer: BC Managed Care – PPO | Admitting: Obstetrics & Gynecology

## 2015-06-28 VITALS — BP 102/68 | HR 64 | Resp 12 | Wt 135.0 lb

## 2015-06-28 DIAGNOSIS — L292 Pruritus vulvae: Secondary | ICD-10-CM

## 2015-06-28 DIAGNOSIS — L280 Lichen simplex chronicus: Secondary | ICD-10-CM | POA: Diagnosis not present

## 2015-06-28 NOTE — Progress Notes (Signed)
Subjective:     Patient ID: Kim Vasquez, female   DOB: 1951/01/02, 64 y.o.   MRN: [id number]  HPI 64 yo G2P2 MWF here for follow up of lichen simplex chronicus.  Pt was treated recurrently for yeast without improvement.  Pt underwent a biopsy 9/51/88 showing lichen simplex chronicus.  Pt using barrier method on skin and has changed products she uses.  Pt has used very little Atarax.  Reports she is doing much better from an itching/irritation standpoint.  A;lso, pt reports urinary issues are much improved as well.  She is so glad the urgency is better.  Reports she thinks she might have a "yeast infection" due to a little cloudy discharge that she feels is also a little "gummy".    Review of Systems  All other systems reviewed and are negative.      Objective:   Physical Exam  Constitutional: She is oriented to person, place, and time. She appears well-developed and well-nourished.  Genitourinary: Vagina normal. There is no rash, tenderness or lesion on the right labia. There is no rash, tenderness or lesion on the left labia.  Lymphadenopathy:       Right: No inguinal adenopathy present.       Left: No inguinal adenopathy present.  Neurological: She is alert and oriented to person, place, and time.  Skin: Skin is warm and dry.  Psychiatric: She has a normal mood and affect.       Assessment:     Lichen Simplex Chronicus, much improved Pt concerned may have yeast    Plan:     Doubtful that this is yeast.  D/W pt importance of trying not to think she has yeast every times she has a little bit of itching and irritation.   Affirm pending

## 2015-06-29 ENCOUNTER — Encounter: Payer: Self-pay | Admitting: Obstetrics & Gynecology

## 2015-06-29 DIAGNOSIS — L280 Lichen simplex chronicus: Secondary | ICD-10-CM | POA: Insufficient documentation

## 2015-06-29 LAB — WET PREP BY MOLECULAR PROBE
Candida species: NEGATIVE
Gardnerella vaginalis: NEGATIVE
Trichomonas vaginosis: NEGATIVE

## 2015-07-24 ENCOUNTER — Ambulatory Visit (INDEPENDENT_AMBULATORY_CARE_PROVIDER_SITE_OTHER)
Admission: RE | Admit: 2015-07-24 | Discharge: 2015-07-24 | Disposition: A | Discharge status: Home / Self Care | Payer: BC Managed Care – PPO | Source: Ambulatory Visit | Attending: Pulmonary Disease | Admitting: Pulmonary Disease

## 2015-07-24 DIAGNOSIS — A310 Pulmonary mycobacterial infection: Secondary | ICD-10-CM

## 2015-07-24 DIAGNOSIS — R918 Other nonspecific abnormal finding of lung field: Secondary | ICD-10-CM

## 2015-07-30 ENCOUNTER — Telehealth: Payer: Self-pay | Admitting: Pulmonary Disease

## 2015-07-30 NOTE — Telephone Encounter (Signed)
CT chest 07/24/15 >> no change in BTX Rt > Lt, micro/macro nodules; coronary atherosclerosis   Left message explaining no change in CT appearance compared to May 2016.  She has ID appointment on 08/14/15.   Will have my nurse schedule ROV in September 2016.

## 2015-07-31 ENCOUNTER — Ambulatory Visit: Payer: BC Managed Care – PPO | Admitting: Infectious Diseases

## 2015-07-31 NOTE — Telephone Encounter (Signed)
Pt scheduled for Sept 30, 2016 at 3:15 with VS. Nothing further needed.

## 2015-08-05 ENCOUNTER — Other Ambulatory Visit: Payer: Self-pay | Admitting: Family Medicine

## 2015-08-05 DIAGNOSIS — R51 Headache: Secondary | ICD-10-CM

## 2015-08-05 DIAGNOSIS — R519 Headache, unspecified: Secondary | ICD-10-CM

## 2015-08-05 DIAGNOSIS — J329 Chronic sinusitis, unspecified: Secondary | ICD-10-CM

## 2015-08-06 ENCOUNTER — Ambulatory Visit
Admission: RE | Admit: 2015-08-06 | Discharge: 2015-08-06 | Disposition: A | Discharge status: Home / Self Care | Payer: BC Managed Care – PPO | Source: Ambulatory Visit | Attending: Family Medicine | Admitting: Family Medicine

## 2015-08-06 DIAGNOSIS — J329 Chronic sinusitis, unspecified: Secondary | ICD-10-CM

## 2015-08-06 DIAGNOSIS — R519 Headache, unspecified: Secondary | ICD-10-CM

## 2015-08-06 DIAGNOSIS — R51 Headache: Secondary | ICD-10-CM

## 2015-08-06 MED ORDER — IOPAMIDOL (ISOVUE-300) INJECTION 61%
75.0000 mL | Freq: Once | INTRAVENOUS | Status: AC | PRN
  Administered 2015-08-06: 75 mL via INTRAVENOUS

## 2015-08-14 ENCOUNTER — Telehealth: Payer: Self-pay | Admitting: Pulmonary Disease

## 2015-08-14 ENCOUNTER — Telehealth: Payer: Self-pay | Admitting: *Deleted

## 2015-08-14 ENCOUNTER — Ambulatory Visit (INDEPENDENT_AMBULATORY_CARE_PROVIDER_SITE_OTHER): Payer: BC Managed Care – PPO | Admitting: Infectious Diseases

## 2015-08-14 ENCOUNTER — Encounter: Payer: Self-pay | Admitting: Infectious Diseases

## 2015-08-14 VITALS — BP 131/71 | HR 82 | Temp 97.6°F | Ht 64.5 in | Wt 131.0 lb

## 2015-08-14 DIAGNOSIS — E871 Hypo-osmolality and hyponatremia: Secondary | ICD-10-CM | POA: Insufficient documentation

## 2015-08-14 DIAGNOSIS — Z23 Encounter for immunization: Secondary | ICD-10-CM | POA: Diagnosis not present

## 2015-08-14 DIAGNOSIS — A310 Pulmonary mycobacterial infection: Secondary | ICD-10-CM

## 2015-08-14 NOTE — Telephone Encounter (Signed)
Patient called and advised she was told by Dr Johnnye Sima to call back once she has her results in hand and they would discuss antibiotic treatment. Advised she could not really explain what the results were just normal and she is anxious to get started with therapy. Advised will let the doctor know she called and have him call her when he can.

## 2015-08-14 NOTE — Telephone Encounter (Signed)
Spoke with Dr. Inda Merlin regarding hyponatremia.  Explained it is possible that her hyponatremia could be related to MAI and no other obvious cause.  Will discuss in more detail at her ROV later in September.

## 2015-08-14 NOTE — Assessment & Plan Note (Signed)
She is awaiting further testing for primary causes to rule out if her lung infection is related to this. Would query her PCP if she needs to have her adrenal glands imaged.

## 2015-08-14 NOTE — Progress Notes (Signed)
   Subjective:    Patient ID: Kim Vasquez, female    DOB: [birth year], 64 y.o.   MRN: [id number]  HPI 64 yo F history of a CT scan in 2009 showing tree in bud appearance. She underwent a BAL which showed Aspergillus [place], Penicillium, and MAI.Was not treated at that time. She returns now after an episode of hemoptysis in January 2014. He went CT scan on January 9 showing: Predominately right upper and middle lobe tree in bud type nodular airspace opacities, bronchial wall thickening, and distal areas of  inspissated secretions, consistent with small airways infection. Findings are increased since the prior exam 02/08/2008. She has undergone repeat December 28, 2012 bronchoscopy the culture growing MAI.  She was adm to [facility] 04-2015 for asthma exacerbation. She was found to have MAI in her sputum again.It also grew Trichosporon.   Her repeat CT showed (07-24-15) 1. Today's study again demonstrates a spectrum of findings that is most compatible with a chronic indolent atypical infectious process such is mycobacterium avium intracellulare (MAI). The overall extent of disease is very similar to the most recent prior examination, and only mildly progressed when compared to more remote prior study 12/22/2012. 2. Atherosclerosis, including left anterior descending coronary artery disease. Please note that although the presence of coronary artery calcium documents the presence of coronary artery disease, the severity of this disease and any potential stenosis cannot be assessed on this non-gated CT examination. Assessment for potential risk factor modification, dietary therapy or pharmacologic therapy may be warranted, if clinically indicated. 3. Additional incidental findings, as above  Today states she does not feel very good. Has been having issues with hyponatremia (as low as 127). Was having headaches, SOB, fatigue. Has been using more albuterol recently. Has cut back on the amt of water she  drinks (prev 2 qts). Her last Na was 135.  Has minimal increase in her cough, more fatigued, more SOB. No f/c.      Review of Systems  Constitutional: Positive for fatigue. Negative for chills and unexpected weight change.  Respiratory: Positive for cough and shortness of breath.   Gastrointestinal: Negative for diarrhea and constipation.  Genitourinary: Negative for difficulty urinating.  wt has been steady in low 130s (light for her).  12 point ROS o/w negative     Objective:   Physical Exam  Constitutional: She appears well-developed and well-nourished.  HENT:  Mouth/Throat: No oropharyngeal exudate.  Eyes: EOM are normal. Pupils are equal, round, and reactive to light.  Neck: Neck supple.  Cardiovascular: Normal rate, regular rhythm and normal heart sounds.   Pulmonary/Chest: Effort normal and breath sounds normal.  She has very minimal wheezes if any at all.   Abdominal: Soft. Bowel sounds are normal. She exhibits no distension. There is no tenderness.  Lymphadenopathy:    She has no cervical adenopathy.      Assessment & Plan:

## 2015-08-14 NOTE — Assessment & Plan Note (Signed)
She has had minimal change in her sx- mild increase in sob, minimal cough, no change in sputum. It is hard to know if this is from her hyponatremia or if her chronic lung infection is causing her hyponatremia. Her CT scan shows minimal progression from 2014, which is reassuring.  She would like to defer starting anbx at this point- she has multiple drug allergies and is concerned about starting a multi-drug regimen for MAI. She also would like to know the results of her urine Na and ADH to know that she doesn't have another primary cause of her dropping Na.  She will call after she has these results and we will discuss possible therapies as needed.

## 2015-08-14 NOTE — Telephone Encounter (Signed)
Dr. Sood - please advise. Thanks. 

## 2015-08-16 ENCOUNTER — Other Ambulatory Visit: Payer: Self-pay | Admitting: *Deleted

## 2015-08-16 ENCOUNTER — Telehealth: Payer: Self-pay | Admitting: Infectious Diseases

## 2015-08-16 DIAGNOSIS — A310 Pulmonary mycobacterial infection: Secondary | ICD-10-CM

## 2015-08-16 MED ORDER — RIFAMPIN 300 MG PO CAPS
600.0000 mg | ORAL_CAPSULE | ORAL | Status: DC
Start: 2015-08-16 — End: 2015-10-31

## 2015-08-16 MED ORDER — AZITHROMYCIN 500 MG PO TABS: 500.0000 mg | ORAL_TABLET | ORAL | Status: DC

## 2015-08-16 MED ORDER — RIFAMPIN 300 MG PO CAPS: 600.0000 mg | ORAL_CAPSULE | ORAL | Status: DC

## 2015-08-16 MED ORDER — ETHAMBUTOL HCL 400 MG PO TABS: 1200.0000 mg | ORAL_TABLET | ORAL | Status: DC

## 2015-08-16 NOTE — Telephone Encounter (Signed)
Patient was notified that her medications were sent to the pharmacy.  RN contacted Dr. Anastasia Pall office regarding referral. Scheduling staff was unsure of the study, advised to contact the physician. Dr. Lake Bells, will you see this patient?  Thank you,  Sharyn Lull

## 2015-08-16 NOTE — Telephone Encounter (Signed)
Yes, happy to see her Please schedule next available Will also have research staff contact her

## 2015-08-16 NOTE — Telephone Encounter (Signed)
Sent rx's, please verify that they went through.  She needs f/u appt in 6 weeks thanks

## 2015-08-16 NOTE — Telephone Encounter (Signed)
lmtcb X1 to schedule appt.  

## 2015-08-16 NOTE — Telephone Encounter (Signed)
Will start pt on azithro, ETH, Rif TIW.  She has previously taken azithro without issue.  Will refer her to [facility] for inhaled amikacin study Explained side effects to pt.  Asked her to call if she has issues.   Will see her back in 6 weeks

## 2015-08-16 NOTE — Telephone Encounter (Signed)
Rx sent to the pharmacy and the patient is aware. Patient given an appt for 09/25/15 with Dr Johnnye Sima as well.

## 2015-08-21 ENCOUNTER — Telehealth: Payer: Self-pay | Admitting: *Deleted

## 2015-08-21 NOTE — Telephone Encounter (Signed)
Patient concerned regarding rifampin 600mg  three times weekly.  The information accompanying her prescription suggested rifampin 600mg  at no more than once or twice a week. SHe is concerned due to there being greater risk of side effects (higher incidence of flu-like symptoms; thrombocytopenia, leukemia, anemia; renal failure; cutaneous effects; hepatic effects) associated with 600mg  >= three times weekly.  She is especially concerned given that the dosage is the same for a larger person versus her frame (130lb).  Pt nervous about the antibiotic therapy, would rather err on the side of caution.  She denies having any pulmonary symptoms at this time.

## 2015-08-26 ENCOUNTER — Telehealth: Payer: Self-pay | Admitting: Pulmonary Disease

## 2015-08-26 ENCOUNTER — Other Ambulatory Visit: Payer: Self-pay

## 2015-08-26 DIAGNOSIS — Z1231 Encounter for screening mammogram for malignant neoplasm of breast: Secondary | ICD-10-CM

## 2015-08-26 NOTE — Telephone Encounter (Signed)
Called and spoke with pt Pt stated that she wanted to see who her appt was with at the end of Sept because she is seeing both Dr Halford Chessman and Dr Lake Bells Informed pt that she has an upcoming appt with BQ on 09-05-15 and that her appt with VS was cancelled that was scheduled 09-13-15 Pt inquired about cancellation and was informed that it was probably due to seeing two providers within the same office within a week Pt ok with seeing BQ  Nothing further is needed at this time

## 2015-09-06 ENCOUNTER — Ambulatory Visit (INDEPENDENT_AMBULATORY_CARE_PROVIDER_SITE_OTHER): Payer: BC Managed Care – PPO | Admitting: Pulmonary Disease

## 2015-09-06 ENCOUNTER — Encounter: Payer: Self-pay | Admitting: Pulmonary Disease

## 2015-09-06 VITALS — BP 118/66 | HR 88 | Ht 64.5 in | Wt 131.0 lb

## 2015-09-06 DIAGNOSIS — R51 Headache: Secondary | ICD-10-CM | POA: Diagnosis not present

## 2015-09-06 DIAGNOSIS — A310 Pulmonary mycobacterial infection: Secondary | ICD-10-CM | POA: Diagnosis not present

## 2015-09-06 DIAGNOSIS — G44201 Tension-type headache, unspecified, intractable: Secondary | ICD-10-CM

## 2015-09-06 DIAGNOSIS — R519 Headache, unspecified: Secondary | ICD-10-CM | POA: Insufficient documentation

## 2015-09-06 NOTE — Assessment & Plan Note (Signed)
Riva has a persistent headache which has been attributed to her hyponatremia. I don't have the benefit of seeing the workup from her hyponatremia but it has been presumed to be SIADH related to her mycobacterial disease. This sounds very reasonable. However, I am concerned that the headache has been worsening in the last month. She describes a constant head pressure which is worse with bending over. She denies visual changes or photophobia. She does seem to be in distress from the headache today just talking to me. Her neurologic exam was within normal limits.  Plan: I'm going to order an MRI of the brain considering her severe headache symptoms and hyponatremia.

## 2015-09-06 NOTE — Patient Instructions (Signed)
WE will see you back with Dr. Halford Chessman in 5 months We will call you with the results of the MRI brain

## 2015-09-06 NOTE — Progress Notes (Signed)
Subjective:    Patient ID: Kim Vasquez, female    DOB: November 02, 1951, 64 y.o.   MRN: [id number]  Synopsis: Mycobacterium avium complex, on treatment as of 2016  HPI Chief Complaint  Patient presents with  . Follow-up    pt states she is doing well- has a minimally productive cough with yellow mucus.  pt does complain of HA, fatigue, and low blood sodium.    Diagnosed 8 years ago with mycobacterial disease. Cough in th emornings, sometimes productive, however the cough has not been worse recently. This year she was found to have hyponatremia and after extensive discussion with pulmonary, ID, and her primary care physician and was felt that the hyponatremia was caused by her mycobacterial disease. She states that her cough, mucus production, shortness of breath have not really changed this year. She was started on 3 drug therapy for atypical mycobacterial disease 1 month ago and has been on treatment since. She says that it may have made her headache a little bit worse. However, she has been compliant with therapy. Her energy level has decreased since this spring.  During this time she has had hyponatremia.  She says that in the last months she's been having more headache and more fatigue. She says the headache is constant, dull, pressure-like. It is worse with bending over. She does not have double vision or weakness. She started treatment a month ago> more fatigue.  Had an asthma attack this year and spent the night in the hospital.    Past Medical History  Diagnosis Date  . GERD (gastroesophageal reflux disease)   . Pulmonary infiltrate   . Seasonal allergies   . IBS (irritable bowel syndrome)   . Osteopenia   . History of anemia after childbirth  . Asthma   . Mycobacterium avium-intracellulare complex       Review of Systems  Constitutional: Negative for fever, chills and fatigue.  HENT: Negative for nosebleeds, postnasal drip and rhinorrhea.   Respiratory: Negative for cough,  shortness of breath and wheezing.   Cardiovascular: Negative for chest pain, palpitations and leg swelling.       Objective:   Physical Exam Filed Vitals:   09/06/15 1634  BP: 118/66  Pulse: 88  Height: 5' 4.5" (1.638 m)  Weight: 131 lb (59.421 kg)  SpO2: 100%  RA  Gen: well appearing HENT: OP clear, neck supple PULM: CTA B, normal percussion CV: RRR, no mgr, trace edema GI: BS+, soft, nontender Derm: no cyanosis or rash Psyche: normal mood and affect Neuro: A&Ox4, MAEW, CN 2-12 intact  CT images from her 07/2015 CT chest personally reviewed showing predominantly right side (right upper lobe and right middle lobe) bronchiectasis with tree in bud abnormalities on the right Notes from Dr. Johnnye Sima review today in clinic where he has been treating her for mycobacterial disease     Assessment & Plan:  MAI (mycobacterium avium-intracellulare) She has been referred to me for consideration of enrollment in a clinical trial for inhaled amikacin to augment standard treatment for patients who are still sputum culture positive despite 6 months of treatment. I think she would be a good candidate if her sputum is still positive after 6 months of standard treatment. I explained the nature of the trial today and she would be willing to proceed.  Plan: Check sputum culture in 5 months, if positive for mycobacterial disease then we will screen her for enrollment in our clinical trial Continue treatment as guided by Dr. Johnnye Sima and  Dr. Halford Chessman  Headache Kim Vasquez has a persistent headache which has been attributed to her hyponatremia. I don't have the benefit of seeing the workup from her hyponatremia but it has been presumed to be SIADH related to her mycobacterial disease. This sounds very reasonable. However, I am concerned that the headache has been worsening in the last month. She describes a constant head pressure which is worse with bending over. She denies visual changes or photophobia. She does  seem to be in distress from the headache today just talking to me. Her neurologic exam was within normal limits.  Plan: I'm going to order an MRI of the brain considering her severe headache symptoms and hyponatremia.     Current outpatient prescriptions:  .  albuterol (PROAIR HFA) 108 (90 BASE) MCG/ACT inhaler, Inhale 2 puffs into the lungs every 6 (six) hours as needed for wheezing or shortness of breath., Disp: 1 Inhaler, Rfl: 5 .  azithromycin (ZITHROMAX) 500 MG tablet, Take 1 tablet (500 mg total) by mouth 3 (three) times a week., Disp: 21 tablet, Rfl: 3 .  Cholecalciferol (VITAMIN D PO), Take by mouth daily., Disp: , Rfl:  .  ethambutol (MYAMBUTOL) 400 MG tablet, Take 3 tablets (1,200 mg total) by mouth 3 (three) times a week., Disp: 60 tablet, Rfl: 3 .  MAGNESIUM PO, Take by mouth daily., Disp: , Rfl:  .  omeprazole (PRILOSEC) 20 MG capsule, Take 10 mg by mouth daily. Patient weaning herself off, Disp: , Rfl:  .  rifampin (RIFADIN) 300 MG capsule, Take 2 capsules (600 mg total) by mouth 3 (three) times a week., Disp: 60 capsule, Rfl: 3 .  vitamin C (ASCORBIC ACID) 500 MG tablet, Take 500 mg by mouth daily., Disp: , Rfl:

## 2015-09-06 NOTE — Assessment & Plan Note (Signed)
She has been referred to me for consideration of enrollment in a clinical trial for inhaled amikacin to augment standard treatment for patients who are still sputum culture positive despite 6 months of treatment. I think she would be a good candidate if her sputum is still positive after 6 months of standard treatment. I explained the nature of the trial today and she would be willing to proceed.  Plan: Check sputum culture in 5 months, if positive for mycobacterial disease then we will screen her for enrollment in our clinical trial Continue treatment as guided by Dr. Johnnye Sima and Dr. Halford Chessman

## 2015-09-10 ENCOUNTER — Ambulatory Visit (HOSPITAL_COMMUNITY): Admission: RE | Admit: 2015-09-10 | Payer: BC Managed Care – PPO | Source: Ambulatory Visit

## 2015-09-13 ENCOUNTER — Ambulatory Visit: Payer: BC Managed Care – PPO | Admitting: Pulmonary Disease

## 2015-09-24 ENCOUNTER — Ambulatory Visit
Admission: RE | Admit: 2015-09-24 | Discharge: 2015-09-24 | Disposition: A | Discharge status: Home / Self Care | Payer: BC Managed Care – PPO | Source: Ambulatory Visit

## 2015-09-24 DIAGNOSIS — Z1231 Encounter for screening mammogram for malignant neoplasm of breast: Secondary | ICD-10-CM

## 2015-09-25 ENCOUNTER — Encounter: Payer: Self-pay | Admitting: Infectious Diseases

## 2015-09-25 ENCOUNTER — Ambulatory Visit (INDEPENDENT_AMBULATORY_CARE_PROVIDER_SITE_OTHER): Payer: BC Managed Care – PPO | Admitting: Infectious Diseases

## 2015-09-25 VITALS — BP 137/89 | HR 67 | Temp 97.4°F | Ht 64.0 in | Wt 132.0 lb

## 2015-09-25 DIAGNOSIS — G44201 Tension-type headache, unspecified, intractable: Secondary | ICD-10-CM | POA: Diagnosis not present

## 2015-09-25 DIAGNOSIS — A310 Pulmonary mycobacterial infection: Secondary | ICD-10-CM

## 2015-09-25 DIAGNOSIS — E871 Hypo-osmolality and hyponatremia: Secondary | ICD-10-CM | POA: Diagnosis not present

## 2015-09-25 LAB — CBC
HCT: 38.2 % (ref 36.0–46.0)
Hemoglobin: 13.4 g/dL (ref 12.0–15.0)
MCH: 32.3 pg (ref 26.0–34.0)
MCHC: 35.1 g/dL (ref 30.0–36.0)
MCV: 92 fL (ref 78.0–100.0)
MPV: 9.5 fL (ref 8.6–12.4)
Platelets: 229 10*3/uL (ref 150–400)
RBC: 4.15 MIL/uL (ref 3.87–5.11)
RDW: 12.6 % (ref 11.5–15.5)
WBC: 4 10*3/uL (ref 4.0–10.5)

## 2015-09-25 LAB — COMPREHENSIVE METABOLIC PANEL WITH GFR
ALT: 9 U/L (ref 6–29)
AST: 15 U/L (ref 10–35)
Albumin: 4.4 g/dL (ref 3.6–5.1)
Alkaline Phosphatase: 55 U/L (ref 33–130)
BUN: 7 mg/dL (ref 7–25)
CO2: 29 mmol/L (ref 20–31)
Calcium: 9.2 mg/dL (ref 8.6–10.4)
Chloride: 100 mmol/L (ref 98–110)
Creat: 0.65 mg/dL (ref 0.50–0.99)
Glucose, Bld: 76 mg/dL (ref 65–99)
Potassium: 5.1 mmol/L (ref 3.5–5.3)
Sodium: 135 mmol/L (ref 135–146)
Total Bilirubin: 0.9 mg/dL (ref 0.2–1.2)
Total Protein: 6.6 g/dL (ref 6.1–8.1)

## 2015-09-25 NOTE — Assessment & Plan Note (Signed)
Will recheck her Na She has deferred her MRi of head, improving.

## 2015-09-25 NOTE — Assessment & Plan Note (Signed)
She is doing much better.  Will check her LFTs, CBC We spoke about the duration of her therapy- would aim for 12 months, she is concerned about prolonged rx.  Will see her back in 3 months, check her LFTs in 2.  Will call her if labs abn She has ophtho scheduled.  My great appreciation to Dr Lake Bells and Dr Halford Chessman

## 2015-09-25 NOTE — Progress Notes (Signed)
   Subjective:    Patient ID: Kim Vasquez, female    DOB: 1951/03/03, 64 y.o.   MRN: [id number]  HPI 64 yo F history of a CT scan in 2009 showing tree in bud appearance. She underwent a BAL which showed Aspergillus [place], Penicillium, and MAI.Was not treated at that time. She returns now after an episode of hemoptysis in January 2014. He went CT scan on January 9 showing: Predominately right upper and middle lobe tree in bud type nodular airspace opacities, bronchial wall thickening, and distal areas of  inspissated secretions, consistent with small airways infection. Findings are increased since the prior exam 02/08/2008. She has undergone repeat December 28, 2012 bronchoscopy the culture growing MAI.  She was adm to [facility] 04-2015 for asthma exacerbation. She was found to have MAI in her sputum again.It also grew Trichosporon.   Her repeat CT showed (07-24-15) 1. Today's study again demonstrates a spectrum of findings that is most compatible with a chronic indolent atypical infectious process such is mycobacterium avium intracellulare (MAI). The overall extent of disease is very similar to the most recent prior examination, and only mildly progressed when compared to more remote prior study 12/22/2012. 2. Atherosclerosis, including left anterior descending coronary artery disease. Please note that although the presence of coronary artery calcium documents the presence of coronary artery disease, the severity of this disease and any potential stenosis cannot be assessed on this non-gated CT examination. Assessment for potential risk factor modification, dietary therapy or pharmacologic therapy may be warranted, if clinically indicated. 3. Additional incidental findings, as above  She was seen in ID on 8-31 and started on A/R/E TIW.  She was also seen by Dr Lake Bells for inhaled amikacin.  Has been feeling better since on ambx now back to her normal energy level.  No cough, no SOB.  Has been  taking "counter measures"- yogurt, pro-biotics.  No vision changes, not able to have checked yet, has appt in ~ 10 days.  No dysgusia.  Has had some pins and needles in her toes for ~ 10 minutes, then resolved.  Her headaches are slightly better, has been better at restricting her fluid. Intermittent. She d/c her prev MRI of head that was ordered.   Review of Systems  Constitutional: Negative for fever, chills, appetite change and unexpected weight change.  Eyes: Negative for visual disturbance.  Gastrointestinal: Negative for diarrhea and constipation.  Genitourinary: Negative for difficulty urinating.       Objective:   Physical Exam  Constitutional: She appears well-developed and well-nourished.  HENT:  Mouth/Throat: No oropharyngeal exudate.  Eyes: EOM are normal.  Neck: Neck supple.  Cardiovascular: Normal rate, regular rhythm and normal heart sounds.   Pulmonary/Chest: Effort normal. She has rhonchi.  Abdominal: Soft. Bowel sounds are normal. There is no tenderness. There is no rebound.  Musculoskeletal: She exhibits no edema.  Lymphadenopathy:    She has no cervical adenopathy.      Assessment & Plan:

## 2015-09-25 NOTE — Assessment & Plan Note (Signed)
See HypoNa section Will recheck.

## 2015-10-15 ENCOUNTER — Telehealth: Payer: Self-pay | Admitting: Infectious Diseases

## 2015-10-15 NOTE — Telephone Encounter (Signed)
Patient left a voicemail on medication assistance line stating she is experiencing ringing of the ears, believes this to be a side effect of doxycyline. Is supposed to take it again tomorrow but is planning on discontinuing unless she is told otherwise.

## 2015-10-15 NOTE — Telephone Encounter (Signed)
Ok to d/c needs a f/u appt.

## 2015-10-15 NOTE — Telephone Encounter (Signed)
Dr. Johnnye Sima, please advise.

## 2015-10-16 NOTE — Telephone Encounter (Signed)
Discontinued Azithromycin rx.  Pt called and had not taken the rx today.  She will stop the medication.  Pt given return appt w/ Dr. Johnnye Sima for [facility] Nov. 17 at [place].

## 2015-10-23 ENCOUNTER — Telehealth: Payer: Self-pay | Admitting: Pulmonary Disease

## 2015-10-23 NOTE — Telephone Encounter (Signed)
Patient says that she feels like she needs to speak with Dr. Halford Chessman, she has been referred to Dr. Johnnye Sima at [place] who prescribed 3 medications for her to take, she did that for 2 months, had a bad reaction to 1 of the medications (Azithromycin) and was told to stop taking that medication.  She stopped taking the medication 3 weeks ago.  Patient is very upset because she has not heard back from Dr. Johnnye Sima, she wants to talk to a provider, not a nurse.. She said that she has a lot of concerns and she really wants to discuss her concerns with a doctor.  She is requesting that Dr. Halford Chessman contact her.  She can be reached at [phone].

## 2015-10-28 NOTE — Telephone Encounter (Signed)
Dr. Halford Chessman have you contacted this pt? Thanks.

## 2015-10-29 NOTE — Telephone Encounter (Signed)
Spoke with pt.  She states that she had recurrent ear pain after taking her antibiotics.  She is not sure which one caused it >> either azithromycin or rifampin.  She has stopped taking both of these, and her ear pain is better.  She is still taking ethambutol.  She is concerned that her MAI will get worse again >> she as feeling much better with 3 drug therapy.  Instructions from ID office were not clear to pt, and there appeared to be confusion about which antibiotics she was taking.  She has appt with Dr. Johnnye Sima for 11/17.  I advised her to keep this appt.  She can call back after her appt on 11/17 if she would like to get 2nd opinion from [facility].

## 2015-10-31 ENCOUNTER — Encounter: Payer: Self-pay | Admitting: Infectious Diseases

## 2015-10-31 ENCOUNTER — Telehealth: Payer: Self-pay | Admitting: Infectious Diseases

## 2015-10-31 ENCOUNTER — Ambulatory Visit (INDEPENDENT_AMBULATORY_CARE_PROVIDER_SITE_OTHER): Payer: BC Managed Care – PPO | Admitting: Infectious Diseases

## 2015-10-31 VITALS — BP 130/89 | HR 71 | Temp 97.8°F | Ht 64.5 in | Wt 129.0 lb

## 2015-10-31 DIAGNOSIS — A310 Pulmonary mycobacterial infection: Secondary | ICD-10-CM | POA: Diagnosis not present

## 2015-10-31 NOTE — Telephone Encounter (Signed)
Received info from [facility] and [facility]- both would be available to see pt.  Both remarked that this is a very unusual reaction to drug.  Will work on getting her appt for [facility] Pt called and informed, she is amenable to going.

## 2015-10-31 NOTE — Assessment & Plan Note (Signed)
She feels better since she has been on medicattons- Na, headaches, fatigue, DOE.  She has not tolerated the current rx's . Will ask Natl Jewish, [facility] for opinions on how to proccede. She is onto 2nd line at best.  I also offered to refer her to [facility] or [facility].  I have asked her to stop [facility] so as not to be on mono-therapy.   I offered her my apologies for the disconnect in communication that she felt.    I will call her with the recommendations as soon as we have them. Cautioned her that amikacin IV may be one the recommendations. She is not keen to be off rx at this point due to her improvement over the last several months.

## 2015-10-31 NOTE — Progress Notes (Signed)
   Subjective:    Patient ID: Kim Vasquez, female    DOB: May 27, 1951, 64 y.o.   MRN: [id number]  HPI 64 yo F history of a CT scan in 2009 showing tree in bud appearance. She underwent a BAL which showed Aspergillus [place], Penicillium, and MAI.Was not treated at that time. She returns now after an episode of hemoptysis in January 2014. He went CT scan on January 9 showing: Predominately right upper and middle lobe tree in bud type nodular airspace opacities, bronchial wall thickening, and distal areas of  inspissated secretions, consistent with small airways infection. Findings are increased since the prior exam 02/08/2008. She has undergone repeat December 28, 2012 bronchoscopy the culture growing MAI.  She was adm to [facility] 04-2015 for asthma exacerbation. She was found to have MAI in her sputum again. It also grew Trichosporon.   Her repeat CT showed (07-24-15) 1. ...demonstrates a spectrum of findings that is most compatible with a chronic indolent atypical infectious process such is mycobacterium avium intracellulare (MAI). The overall extent of disease is very similar to the most recent prior examination, and only mildly progressed when compared to more remote prior study 12/22/2012. 2. Atherosclerosis, including left anterior descending coronary artery disease. Please note that although the presence of coronary artery calcium documents the presence of coronary artery disease, the severity of this disease and any potential stenosis cannot be assessed on this non-gated CT examination. Assessment for potential risk factor modification, dietary therapy or pharmacologic therapy may be warranted, if clinically indicated. 3. Additional incidental findings, as above  On 9-2 she was started on azithro/rifampin/ethambutol.  She developed ear pain and hearing loss on Left, on these and she quit taking the azithro and rifampin. She had improvement in her ear pain and her hearing with stopping the  azithro. She tried taking the rifampin and she again had ear pain and hearing issues.   Was seen by ENT last week, was told her hearing is fine per pt. She still has ear pain, no comment by ENT per pt.   Feels like her cough is a little worse. No SOB. Has not had DOE since on anbx.   Review of Systems     Objective:   Physical Exam  Constitutional: She appears well-developed and well-nourished.  HENT:  Ears:  Mouth/Throat: No oropharyngeal exudate.  Eyes: EOM are normal. Pupils are equal, round, and reactive to light.  Cardiovascular: Normal rate, regular rhythm and normal heart sounds.   Pulmonary/Chest: Effort normal and breath sounds normal.  Abdominal: Soft. Bowel sounds are normal. There is no tenderness. There is no rebound.          Assessment & Plan:

## 2015-11-04 ENCOUNTER — Telehealth: Payer: Self-pay | Admitting: *Deleted

## 2015-11-04 NOTE — Telephone Encounter (Signed)
Faxed referral to [facility] at [phone]. They will contact patient to set up appointment.  Landis Gandy, RN

## 2015-11-05 NOTE — Telephone Encounter (Signed)
Referred to [facility].  All notes sent 11/21.  They will contact the patient to schedule.

## 2015-11-05 NOTE — Telephone Encounter (Signed)
Patient aware, will call [facility] if she does not hear from [place] next week.

## 2015-11-11 ENCOUNTER — Telehealth: Payer: Self-pay | Admitting: *Deleted

## 2015-11-11 NOTE — Telephone Encounter (Signed)
Pt given appt @ [facility] w/ Dr. Miguel Dibble not Dr. Lorenda Cahill for 12/05/15.  Ms Budish concerned that the appt was not with Dr. Lorenda Cahill and that it is not until Dec. 22.  Pt would like a phone call from Dr. Johnnye Sima to talk about the referral and appt timing.

## 2015-11-12 NOTE — Telephone Encounter (Signed)
Spoke with pt about her overall care.  She is concerned about how long she would have to wait to see Dr Lorenda Cahill.

## 2015-11-12 NOTE — Telephone Encounter (Signed)
Patient called and is not pleased with the appt made for her at [facility]. She advised 12/05/15 is to far away she needs to see someone now she is having symptoms now. Advised her Kim Vasquez is not available and 12/05/15 is the first available. She advised unacceptable. Tried to give her the number to try and see if she could get appt sooner and with Kim Vasquez. She also advises she has been off of medication for a month and does not understand why as she feels she is getting sicker. She feels she is being pushed off and not taken care of. Asked the patient what she would like for me to do as she is not happy and would not accept an earlier appt with Kim Vasquez. She asked to have Kim Vasquez call her or tell her why he will not help her. Advised her conversation will probably be better in person and she advised to just have the doctor call her asap. Advised can let him know she would like a call but he is seeing patients and rounding and it may not be today. Ended the call.

## 2015-11-13 NOTE — Telephone Encounter (Addendum)
Records faxed to Dr. Lorenda Cahill for review per Dr. Johnnye Sima.  Copy of Chest CT from 07/24/15 CD ROM being mailed to Dr. Lorenda Cahill for review.  CD mailed to dr. Lorenda Cahill 11/14/15.

## 2015-11-18 ENCOUNTER — Ambulatory Visit (INDEPENDENT_AMBULATORY_CARE_PROVIDER_SITE_OTHER): Payer: BC Managed Care – PPO | Admitting: Obstetrics & Gynecology

## 2015-11-18 VITALS — BP 112/86 | HR 72 | Temp 97.5°F | Resp 16 | Ht 64.5 in | Wt 131.0 lb

## 2015-11-18 DIAGNOSIS — R300 Dysuria: Secondary | ICD-10-CM

## 2015-11-18 LAB — POCT URINALYSIS DIPSTICK
Bilirubin, UA: NEGATIVE
Blood, UA: NEGATIVE
Glucose, UA: NEGATIVE
Ketones, UA: NEGATIVE
Leukocytes, UA: NEGATIVE
Nitrite, UA: NEGATIVE
Protein, UA: NEGATIVE
Urobilinogen, UA: NEGATIVE
pH, UA: 7

## 2015-11-18 MED ORDER — NITROFURANTOIN MONOHYD MACRO 100 MG PO CAPS: 100.0000 mg | ORAL_CAPSULE | Freq: Two times a day (BID) | ORAL | Status: DC

## 2015-11-18 NOTE — Progress Notes (Signed)
Subjective:     Patient ID: Kim Vasquez, female   DOB: Feb 15, 1951, 64 y.o.   MRN: [id number]  HPI 64 yo G2P2 MWF here for complaint of "bladder pain" with dysuria for two days.  She' had associated increased pelvic pressure and frequency as well.  Denies fever.  Denies hematuria.  She's taken Naprosyn and pyridium with some improvement.  Yesterday, she states she "felt terrible, like an infection".  Today, she feels better but attributes this to the pyridium.  Pt reports her vulvar irritation has resolved.  Scott toilet paper and vaseline have really helped.  She's also used boric acid vaginally prn.  D/w pt this is probably not necessary and may be contributing to her issues.  I would not recommend she continue boric acid suppositories.  (This medication isn't on her medication list either and I've never given her that Rx.)    Pt has similar issues last year and was referred to urology.  She was Dr. Bjorn Loser.  Pt states she did try the Uribel as recommended by Dr. Matilde Sprang but she didn't think this did very much.  She was also placed on Uriflo which she thinks did really help.  She only took this for two weeks.  She still has some of the samples he gave he.  His notes do not give exact reasoning for her symptoms but he was not highly suspicious of IC.  Review of Systems  Genitourinary: Positive for dysuria and frequency. Negative for flank pain, vaginal bleeding, genital sores, vaginal pain and menstrual problem.  All other systems reviewed and are negative.      Objective:   Physical Exam  Constitutional: She appears well-developed and well-nourished.  Abdominal: Soft. Bowel sounds are normal. She exhibits no distension and no mass. There is no tenderness. There is no rebound and no guarding.  Genitourinary: Vagina normal and uterus normal. There is no rash, tenderness, lesion or injury on the right labia. There is no rash, tenderness, lesion or injury on the left labia. Cervix  exhibits no motion tenderness. Right adnexum displays no mass, no tenderness and no fullness. Left adnexum displays no mass, no tenderness and no fullness.  Lymphadenopathy:       Right: No inguinal adenopathy present.       Left: No inguinal adenopathy present.  Skin: Skin is warm and dry.  Psychiatric: She has a normal mood and affect.       Assessment:     Dysuria Multiple drug allergies     Plan:     Macrobid 100mg  bid x 5 days Urine culture and urine micro pending.  If these are negative, I will have pt restart the Uriflo. Stop boric acid suppositories

## 2015-11-19 LAB — URINALYSIS, MICROSCOPIC ONLY
Bacteria, UA: NONE SEEN [HPF]
Casts: NONE SEEN [LPF]
Crystals: NONE SEEN [HPF]
RBC / HPF: NONE SEEN RBC/HPF (ref <=2)
Squamous Epithelial / LPF: NONE SEEN [HPF] (ref <=5)
WBC, UA: NONE SEEN WBC/HPF (ref <=5)
Yeast: NONE SEEN [HPF]

## 2015-11-19 LAB — URINE CULTURE: Colony Count: 9000

## 2015-11-21 ENCOUNTER — Telehealth: Payer: Self-pay | Admitting: Emergency Medicine

## 2015-11-21 NOTE — Telephone Encounter (Signed)
Reviewed designated party release form. Okay for detailed message. Left detailed message with results, however, advised that Dr. Sabra Heck would like to know how she is doing.   Advised to please call our office for update. Can give information to front office for update as well.

## 2015-11-21 NOTE — Telephone Encounter (Signed)
Message left to return call to Diamonte Stavely at [phone].    

## 2015-11-21 NOTE — Telephone Encounter (Signed)
Returning a call to [place]. Patient said to please leave her results on her voicemail.

## 2015-11-21 NOTE — Telephone Encounter (Signed)
-----   Message from Megan Salon, MD sent at 11/20/2015  7:57 AM EST ----- Inform pt that her urine culture is negative.  She should start back on the Rapiflo.  She can stop the antibiotics.  She has medication as samples from Dr. Matilde Sprang.  Please have her call and give update before the weekend.  Thanks.

## 2015-11-21 NOTE — Telephone Encounter (Signed)
Patient is calling to give an update to [place]. She is feeling better not 100% but definitely better

## 2015-11-22 ENCOUNTER — Encounter: Payer: Self-pay | Admitting: Obstetrics & Gynecology

## 2015-11-22 NOTE — Telephone Encounter (Signed)
Great.  If she started the Uriflo, she should take it again for two weeks.  She's probably going to need to see Dr. Matilde Sprang again for follow up and so she has a RF of this on hand when she needs it.  Has seen him in the past and no referral should be needed.

## 2015-11-22 NOTE — Telephone Encounter (Signed)
Routing update to Dr. Sabra Heck.

## 2015-11-25 NOTE — Telephone Encounter (Signed)
Call to patient with message from Dr. Sabra Heck. She states she really feels better and she will take the message from Dr. Sabra Heck under consideration but she does not think she will restart the medication and she will call Dr. Matilde Sprang if necessary. She states she is unsure of what happened but she feels a lot better so she will follow up as needed.  Advised to call back with any concerns.  Routing to provider for final review. Patient agreeable to disposition. Will close encounter.

## 2015-12-11 ENCOUNTER — Other Ambulatory Visit: Payer: BC Managed Care – PPO

## 2015-12-24 ENCOUNTER — Other Ambulatory Visit: Payer: Self-pay | Admitting: Family Medicine

## 2015-12-24 DIAGNOSIS — R1013 Epigastric pain: Secondary | ICD-10-CM

## 2015-12-25 ENCOUNTER — Ambulatory Visit: Payer: BC Managed Care – PPO | Admitting: Infectious Diseases

## 2015-12-26 ENCOUNTER — Ambulatory Visit
Admission: RE | Admit: 2015-12-26 | Discharge: 2015-12-26 | Disposition: A | Discharge status: Home / Self Care | Payer: BC Managed Care – PPO | Source: Ambulatory Visit | Attending: Family Medicine | Admitting: Family Medicine

## 2015-12-26 DIAGNOSIS — R1013 Epigastric pain: Secondary | ICD-10-CM

## 2016-01-16 ENCOUNTER — Telehealth: Payer: Self-pay | Admitting: Obstetrics & Gynecology

## 2016-01-16 NOTE — Telephone Encounter (Signed)
Spoke with patient. Patient states that she has a "bruise" on her left breast that she noticed yesterday. States there has been no trauma to the left breast. "It looks like it is a deep bruise. It is about half and inch in diameter." States her left breast is tender. Denies any swelling, redness, or warmth to the breast. She feels there is a lump where the bruise is "I can't really tell though. I have fibrocystic breasts."  Reports the bruise is close to her scar from when she had a breast biopsy in the past. Advised she will need to be seen in office for further evaluation. She is agreeable. Offered appointment tomorrow with NP, but patient declines. Appointment scheduled for 01/20/2016 at 9:15 am with Dr.Miller. She is agreeable to date and time. Advised if symptoms worsen or she develops any new symptoms such as swelling, redness, or warmth to the breast she will need to be seen for immediate evaluation. She is agreeable.  Routing to provider for final review. Patient agreeable to disposition. Will close encounter.

## 2016-01-16 NOTE — Telephone Encounter (Signed)
Patient has bruise on her left breast and wants to be seen.

## 2016-01-20 ENCOUNTER — Ambulatory Visit (INDEPENDENT_AMBULATORY_CARE_PROVIDER_SITE_OTHER): Payer: BC Managed Care – PPO | Admitting: Obstetrics & Gynecology

## 2016-01-20 VITALS — BP 112/80 | HR 70 | Temp 98.0°F | Ht 64.5 in | Wt 131.0 lb

## 2016-01-20 DIAGNOSIS — N644 Mastodynia: Secondary | ICD-10-CM

## 2016-01-20 DIAGNOSIS — S20112A Abrasion of breast, left breast, initial encounter: Secondary | ICD-10-CM

## 2016-01-20 NOTE — Progress Notes (Signed)
Subjective:     Patient ID: Kim Vasquez, female   DOB: [birth year], 65 y.o.   MRN: [id number]  HPI 65 yo G2P2 MWF here for complaint of left breast pain, bruise, and possible lump that has been present about a week.  Pt states she has a hypothesis for this--maybe it was due to a bra that has frontal support. However, she's not completely sure she has worn that one recently.  The bruise is healing and improving.  She also has a little skin itching that is improving as well and is not causing any skin changes.   Also, she feels she's seen a bruise in the same place over the last month or two--was present, resolved, and occurred again.  Denies any trauma.  Does have pain in the same location.  No nipple discharge.    Last MMG 10/112/16 at the [facility]  All other systems reviewed and are negative.      Objective:   Physical Exam  Constitutional: She is oriented to person, place, and time.  HENT:  Head: Normocephalic and atraumatic.  Neck: Normal range of motion. Neck supple. No thyromegaly present.  Cardiovascular: Normal rate and regular rhythm.   Pulmonary/Chest: Effort normal and breath sounds normal. Right breast exhibits no inverted nipple, no mass, no nipple discharge, no skin change and no tenderness. Left breast exhibits skin change. Left breast exhibits no inverted nipple, no mass, no nipple discharge and no tenderness. Breasts are symmetrical.    No masses noted.  Lymphadenopathy:    She has no cervical adenopathy.  Neurological: She is alert and oriented to person, place, and time.  Psychiatric: She has a normal mood and affect.   D/W pt normal findings except the breast bruising.  Pt very anxious about this finding.    Assessment:     Left breast bruising without clear reasoning as well as breast tenderness     Plan:     Diagnostic left breast imaging will be completed.

## 2016-01-20 NOTE — Progress Notes (Signed)
Scheduled patient while in office for left breast mammogram and ultrasound at the [place] on 01/21/2016 at 9:20 am. She is agreeable to date and time. Placed in mammogram hold.

## 2016-01-21 ENCOUNTER — Encounter: Payer: Self-pay | Admitting: Obstetrics & Gynecology

## 2016-01-21 ENCOUNTER — Ambulatory Visit
Admission: RE | Admit: 2016-01-21 | Discharge: 2016-01-21 | Disposition: A | Discharge status: Home / Self Care | Payer: BC Managed Care – PPO | Source: Ambulatory Visit | Attending: Obstetrics & Gynecology | Admitting: Obstetrics & Gynecology

## 2016-01-21 DIAGNOSIS — S20112A Abrasion of breast, left breast, initial encounter: Secondary | ICD-10-CM

## 2016-01-22 ENCOUNTER — Telehealth: Payer: Self-pay | Admitting: Emergency Medicine

## 2016-01-22 NOTE — Telephone Encounter (Signed)
-----   Message from Megan Salon, MD sent at 01/22/2016  8:04 AM EST ----- Please let pt know her diagnostic MMG was negative for abnormal findings.  I'd like to recheck the breast in 1 month.  Thanks.

## 2016-01-22 NOTE — Telephone Encounter (Signed)
Call to patient and discussed message from Dr. Sabra Heck.  She has an annual exam scheduled on 03/10/16. She would like to just plan for annual exam if that is possible. Advised would check with Dr. Sabra Heck and if not okay to wait until 3/28 for breast check will call back. Patient agreeable.

## 2016-01-23 NOTE — Telephone Encounter (Signed)
As long as she does monthly exams and calls with any changes, ok to wait for AEX until 3/18.  Ok to close encounter.

## 2016-01-24 ENCOUNTER — Encounter: Payer: Self-pay | Admitting: Obstetrics & Gynecology

## 2016-01-28 NOTE — Telephone Encounter (Signed)
Patient notified by Marijo File RMA. Patient prefers to wait for annual exam.   Encounter closed.

## 2016-02-06 ENCOUNTER — Encounter: Payer: Self-pay | Admitting: Pulmonary Disease

## 2016-02-06 ENCOUNTER — Ambulatory Visit (INDEPENDENT_AMBULATORY_CARE_PROVIDER_SITE_OTHER): Payer: BC Managed Care – PPO | Admitting: Pulmonary Disease

## 2016-02-06 VITALS — BP 122/74 | HR 84 | Ht 64.5 in | Wt 132.2 lb

## 2016-02-06 DIAGNOSIS — R918 Other nonspecific abnormal finding of lung field: Secondary | ICD-10-CM | POA: Diagnosis not present

## 2016-02-06 DIAGNOSIS — J4520 Mild intermittent asthma, uncomplicated: Secondary | ICD-10-CM

## 2016-02-06 DIAGNOSIS — A310 Pulmonary mycobacterial infection: Secondary | ICD-10-CM | POA: Diagnosis not present

## 2016-02-06 DIAGNOSIS — J479 Bronchiectasis, uncomplicated: Secondary | ICD-10-CM

## 2016-02-06 NOTE — Patient Instructions (Signed)
Follow up in 4 months 

## 2016-02-06 NOTE — Progress Notes (Signed)
Current Outpatient Prescriptions on File Prior to Visit  Medication Sig  . albuterol (PROAIR HFA) 108 (90 BASE) MCG/ACT inhaler Inhale 2 puffs into the lungs every 6 (six) hours as needed for wheezing or shortness of breath.  Marland Kitchen azithromycin (ZITHROMAX) 500 MG tablet Take 1 tablet by mouth 3 (three) times a week.  . Cholecalciferol (VITAMIN D PO) Take by mouth daily. Reported on 02/06/2016  . clobetasol cream (TEMOVATE) [id number] % 1 APPLICATION APPLY ON THE SKIN TWICE A DAY APPLY TO RASH ON BACK  . ethambutol (MYAMBUTOL) 400 MG tablet Take 1 tablet by mouth 3 (three) times a week.  . Flaxseed, Linseed, (FLAXSEED OIL) 1000 MG CAPS Take by mouth.  Marland Kitchen ketoconazole (NIZORAL) 2 % cream APPLY ON THE SKIN TWICE A DAY AS NEEDED APPLY TO RASH UNDER BREAST  . MAGNESIUM PO Take by mouth daily.  . rifampin (RIFADIN) 300 MG capsule Take 2 capsules by mouth 3 (three) times a week.  . vitamin C (ASCORBIC ACID) 500 MG tablet Take 500 mg by mouth daily.   No current facility-administered medications on file prior to visit.    Chief Complaint  Patient presents with  . Follow-up    pt. states cough, SOB, wheezing and chest tightness have improved. no new concerns today.    Tests: 02/08/08 CT chest >> RML tree in bud, mild BTX RML 05/29/08 Bronchoscopy >> scarring RML, cytology negative; MAI, Penicillium, and Aspergillus [place] in BAL 12/22/12 CT chest >> RUL and RML tree in bud, BTX RML 12/28/12 Bronchoscopy >> MAI in BAL, cytology negative PFT 03/01/13 >> FEV1 2.92 (136%), FEV1% 85, TLC 5.91 (124%), DLCO 117%, borderline BD. 04/23/15 CT chest >> BTX RUL/RML, nodular infiltrates RLL/RML increased in size  Past medical history GERD, IBS, Hyponatremia  PSHx, Medications, Allergies, Fhx, Shx reviewed.  Vital signs BP 122/74 mmHg  Pulse 84  Ht 5' 4.5" (1.638 m)  Wt 132 lb 3.2 oz (59.966 kg)  BMI 22.35 kg/m2  SpO2 99%  LMP 12/14/2004   History of Present Illness: Kim Vasquez is a 65 y.o. female former  smoker with chronic cough, localized BTX, and MAI.  Since her last visit she was seen by ID and started on Tx for MAI.  She is now followed by Dr. Elizabeth Vasquez at [facility].  She has been feeling much better with her breathing.  She has occasional cough, but not as much sputum.  She denies fever, sweats, hemoptysis, or chest pain.  She had lab tests at [facility] >> Na 131.  Physical Exam:  General - No distress ENT - No sinus tenderness, no oral exudate, no LAN Cardiac - s1s2 regular, no murmur Chest - No wheeze/rales/dullness, good air entry, normal respiratory excursion Back - No focal tenderness Abd - Soft, non-tender Ext - No edema Neuro - Normal strength Skin - No rashes Psych - Normal mood, and behavior   Assessment/Plan:  MAI with bronchiectasis. Plan: - continue azithromycin, ethambutol, rifampin per ID at [facility] - will re-assess at f/u and determine when she needs to get repeat CT chest done  Asthma. Plan: - prn albuterol  Hyponatremia. Plan: - f/u with PCP >> explained that she might need nephrology assessment if sodium level gets lower   Patient Instructions  Follow up in 4 months    Kim Mires, MD [place] [facility]/Sleep Pager:  [phone] 02/06/2016, 5:18 PM

## 2016-03-04 ENCOUNTER — Other Ambulatory Visit: Payer: Self-pay | Admitting: Gastroenterology

## 2016-03-05 ENCOUNTER — Encounter (HOSPITAL_COMMUNITY): Payer: Self-pay | Admitting: Anesthesiology

## 2016-03-05 NOTE — Anesthesia Preprocedure Evaluation (Deleted)
Anesthesia Evaluation  Patient identified by MRN, date of birth, ID band Patient awake    Reviewed: Allergy & Precautions, NPO status , Patient's Chart, lab work & pertinent test results  Airway        Dental   Pulmonary asthma (inhalers) , former smoker (quit 1983),  MAI (mycobacterium avium intracellular) being RX,d          Cardiovascular negative cardio ROS       Neuro/Psych negative neurological ROS  negative psych ROS   GI/Hepatic negative GI ROS, Neg liver ROS, GERD  Medicated,  Endo/Other  negative endocrine ROS  Renal/GU negative Renal ROS  negative genitourinary   Musculoskeletal negative musculoskeletal ROS (+)   Abdominal   Peds negative pediatric ROS (+)  Hematology negative hematology ROS (+)   Anesthesia Other Findings   Reproductive/Obstetrics negative OB ROS                             Anesthesia Physical Anesthesia Plan  ASA: II  Anesthesia Plan: MAC   Post-op Pain Management:    Induction: Intravenous  Airway Management Planned: Nasal Cannula  Additional Equipment:   Intra-op Plan:   Post-operative Plan:   Informed Consent: I have reviewed the patients History and Physical, chart, labs and discussed the procedure including the risks, benefits and alternatives for the proposed anesthesia with the patient or authorized representative who has indicated his/her understanding and acceptance.     Plan Discussed with:   Anesthesia Plan Comments: (Breathing has improved since treatmet for MAI has started)        Anesthesia Quick Evaluation

## 2016-03-06 ENCOUNTER — Other Ambulatory Visit: Payer: Self-pay | Admitting: Gastroenterology

## 2016-03-09 ENCOUNTER — Ambulatory Visit (HOSPITAL_COMMUNITY)
Admission: RE | Admit: 2016-03-09 | Payer: BC Managed Care – PPO | Source: Ambulatory Visit | Admitting: Gastroenterology

## 2016-03-09 ENCOUNTER — Encounter (HOSPITAL_COMMUNITY): Admission: RE | Payer: Self-pay | Source: Ambulatory Visit

## 2016-03-09 SURGERY — COLONOSCOPY WITH PROPOFOL
Anesthesia: Monitor Anesthesia Care

## 2016-03-10 ENCOUNTER — Encounter: Payer: Self-pay | Admitting: Obstetrics & Gynecology

## 2016-03-10 ENCOUNTER — Ambulatory Visit (INDEPENDENT_AMBULATORY_CARE_PROVIDER_SITE_OTHER): Payer: BC Managed Care – PPO | Admitting: Obstetrics & Gynecology

## 2016-03-10 VITALS — BP 108/70 | HR 84 | Resp 16 | Ht 64.5 in | Wt 132.0 lb

## 2016-03-10 DIAGNOSIS — Z01419 Encounter for gynecological examination (general) (routine) without abnormal findings: Secondary | ICD-10-CM

## 2016-03-10 DIAGNOSIS — Z0000 Encounter for general adult medical examination without abnormal findings: Secondary | ICD-10-CM

## 2016-03-10 LAB — POCT URINALYSIS DIPSTICK
Bilirubin, UA: NEGATIVE
Glucose, UA: NEGATIVE
Ketones, UA: NEGATIVE
Nitrite, UA: NEGATIVE
Protein, UA: NEGATIVE
Urobilinogen, UA: NEGATIVE
pH, UA: 5

## 2016-03-10 NOTE — Progress Notes (Signed)
65 y.o. G2P2 MarriedCaucasianF here for annual exam.  Pt reports she's back on her antibiotics for Mycobaterium avium intracellulare.  She is seeing and ID specialist at [facility], now.  Had stopped the antibiotics due to an ear pain that she thought was related.  Doesn't think stopping or starting the antibiotics have made any difference with the ear issue.  Denies vulvar itching/irritation.  Reports she's doing very well.  Pulmonologist:  Dr.  Halford Chessman.  Last appt was February.    PCP:  Dr. Darcus Austin.  Pt had Hep C testing done.  Has done blood work done this year.   Patient's last menstrual period was 12/14/2004.          Sexually active: Yes.    The current method of family planning is vasectomy and post menopausal status.    Exercising: Yes.    Walking, garden, light weights Smoker:  no  Health Maintenance: Pap:  01/07/15 Neg. 08/31/12 Neg. HR HPV:Neg History of abnormal Pap:  no MMG:  01/28/16 [place] Left BIRADS1:neg Colonoscopy: 2012 Dr. Wynetta Emery - scheduled 04/21/16 BMD:   2010 TDaP:  05/2012 Screening Labs: ?, Urine today: pending   reports that she quit smoking about 34 years ago. Her smoking use included Cigarettes. She has a 14 pack-year smoking history. She has never used smokeless tobacco. She reports that she drinks about 8.4 oz of alcohol per week. She reports that she does not use illicit drugs.  Past Medical History  Diagnosis Date  . GERD (gastroesophageal reflux disease)   . Pulmonary infiltrate   . Seasonal allergies   . IBS (irritable bowel syndrome)   . Osteopenia   . History of anemia after childbirth  . Asthma   . Mycobacterium avium-intracellulare complex [facility])     Past Surgical History  Procedure Laterality Date  . Carpal tunnel release  2007       . Video bronchoscopy  12/28/2012    Procedure: VIDEO BRONCHOSCOPY WITHOUT FLUORO;  Surgeon: Chesley Mires, MD;  Location: [facility];  Service: Cardiopulmonary;  Laterality: Bilateral;  . Breast surgery  2/07    left  breast    Current Outpatient Prescriptions  Medication Sig Dispense Refill  . albuterol (PROAIR HFA) 108 (90 BASE) MCG/ACT inhaler Inhale 2 puffs into the lungs every 6 (six) hours as needed for wheezing or shortness of breath. 1 Inhaler 5  . azithromycin (ZITHROMAX) 500 MG tablet Take 1 tablet by mouth 3 (three) times a week.    . Cholecalciferol (VITAMIN D PO) Take by mouth daily. Reported on 02/06/2016    . ethambutol (MYAMBUTOL) 400 MG tablet Take 1 tablet by mouth 3 (three) times a week.  11  . famotidine (PEPCID) 10 MG tablet Take 10 mg by mouth 2 (two) times daily.    . Flaxseed, Linseed, (FLAXSEED OIL) 1000 MG CAPS Take by mouth.    Marland Kitchen MAGNESIUM PO Take by mouth daily.    . rifampin (RIFADIN) 300 MG capsule Take 2 capsules by mouth 3 (three) times a week.    . vitamin C (ASCORBIC ACID) 500 MG tablet Take 500 mg by mouth daily.    . promethazine (PHENERGAN) 12.5 MG tablet Take by mouth.     No current facility-administered medications for this visit.    Family History  Problem Relation Age of Onset  . Lymphoma Mother   . Breast cancer Sister   . Colon cancer Maternal Aunt   . Heart disease      maternal grandparents  .  Diabetes Paternal Grandmother   . Hypertension Father   . Bipolar disorder Sister   . Bipolar disorder Maternal Grandmother   . Osteoporosis Paternal Grandmother     ROS:  Pertinent items are noted in HPI.  Otherwise, a comprehensive ROS was negative.  Exam:   BP 108/70 mmHg  Pulse 84  Resp 16  Ht 5' 4.5" (1.638 m)  Wt 132 lb (59.875 kg)  BMI 22.32 kg/m2  LMP 12/14/2004  Weight change: -4#   Height: 5' 4.5" (163.8 cm)  Ht Readings from Last 3 Encounters:  03/10/16 5' 4.5" (1.638 m)  02/06/16 5' 4.5" (1.638 m)  01/20/16 5' 4.5" (1.638 m)    General appearance: alert, cooperative and appears stated age Head: Normocephalic, without obvious abnormality, atraumatic Neck: no adenopathy, supple, symmetrical, trachea midline and thyroid normal to  inspection and palpation Lungs: clear to auscultation bilaterally Breasts: normal appearance, no masses or tenderness Heart: regular rate and rhythm Abdomen: soft, non-tender; bowel sounds normal; no masses,  no organomegaly Extremities: extremities normal, atraumatic, no cyanosis or edema Skin: Skin color, texture, turgor normal. No rashes or lesions Lymph nodes: Cervical, supraclavicular, and axillary nodes normal. No abnormal inguinal nodes palpated Neurologic: Grossly normal   Pelvic: External genitalia:  no lesions              Urethra:  normal appearing urethra with no masses, tenderness or lesions              Bartholins and Skenes: normal                 Vagina: normal appearing vagina with normal color and discharge, no lesions              Cervix: no lesions              Pap taken: Yes.   Bimanual Exam:  Uterus:  normal size, contour, position, consistency, mobility, non-tender              Adnexa: normal adnexa and no mass, fullness, tenderness               Rectovaginal: Confirms               Anus:  normal sphincter tone, no lesions  Chaperone was present for exam.  A:  Well Woman with normal exam H/O MAI.  On antibiotics.  Seeing ID at [facility] Followed yearly by Dr. Halford Chessman, pulmonology.  Family hx of breast cancer, sister. H/O IBS/GERD Thyroid nodule on CT, noted 1/14 CT.  Nodule then was stable. Osteopenia  P: Mammogram yearly Plan BMD at age 47. pap smear with neg HR HPV 2013. Pap 2016 was negative.  No pap today. Lab work this year with Dr. Inda Merlin return annually or prn

## 2016-04-21 ENCOUNTER — Ambulatory Visit (HOSPITAL_COMMUNITY)
Admission: RE | Admit: 2016-04-21 | Payer: BC Managed Care – PPO | Source: Ambulatory Visit | Admitting: Gastroenterology

## 2016-04-21 ENCOUNTER — Encounter (HOSPITAL_COMMUNITY): Admission: RE | Payer: Self-pay | Source: Ambulatory Visit

## 2016-04-21 SURGERY — COLONOSCOPY WITH PROPOFOL
Anesthesia: Monitor Anesthesia Care

## 2016-06-05 ENCOUNTER — Ambulatory Visit (INDEPENDENT_AMBULATORY_CARE_PROVIDER_SITE_OTHER): Payer: BC Managed Care – PPO | Admitting: Pulmonary Disease

## 2016-06-05 ENCOUNTER — Encounter: Payer: Self-pay | Admitting: Pulmonary Disease

## 2016-06-05 VITALS — BP 112/78 | HR 85 | Ht 64.5 in | Wt 133.4 lb

## 2016-06-05 DIAGNOSIS — A310 Pulmonary mycobacterial infection: Secondary | ICD-10-CM

## 2016-06-05 DIAGNOSIS — J479 Bronchiectasis, uncomplicated: Secondary | ICD-10-CM

## 2016-06-05 DIAGNOSIS — J4520 Mild intermittent asthma, uncomplicated: Secondary | ICD-10-CM

## 2016-06-05 MED ORDER — ALBUTEROL SULFATE HFA 108 (90 BASE) MCG/ACT IN AERS: 2.0000 | INHALATION_SPRAY | Freq: Four times a day (QID) | RESPIRATORY_TRACT | Status: DC | PRN

## 2016-06-05 NOTE — Patient Instructions (Signed)
Follow up in 6 months 

## 2016-06-05 NOTE — Progress Notes (Signed)
Current Outpatient Prescriptions on File Prior to Visit  Medication Sig  . albuterol (PROAIR HFA) 108 (90 BASE) MCG/ACT inhaler Inhale 2 puffs into the lungs every 6 (six) hours as needed for wheezing or shortness of breath.  Marland Kitchen azithromycin (ZITHROMAX) 500 MG tablet Take 1 tablet by mouth 3 (three) times a week.  . Cholecalciferol (VITAMIN D PO) Take by mouth daily. Reported on 02/06/2016  . ethambutol (MYAMBUTOL) 400 MG tablet Take 1 tablet by mouth 3 (three) times a week.  . famotidine (PEPCID) 10 MG tablet Take 10 mg by mouth 2 (two) times daily.  . Flaxseed, Linseed, (FLAXSEED OIL) 1000 MG CAPS Take by mouth.  Marland Kitchen MAGNESIUM PO Take by mouth daily.  . rifampin (RIFADIN) 300 MG capsule Take 2 capsules by mouth 3 (three) times a week.  . vitamin C (ASCORBIC ACID) 500 MG tablet Take 500 mg by mouth daily.  . promethazine (PHENERGAN) 12.5 MG tablet Take by mouth.   No current facility-administered medications on file prior to visit.    Chief Complaint  Patient presents with  . Follow-up    Denies any current breathing issues or concerns. Pt states that she has not taken the Ethambutol this past week d/t some blurriness in right eye and posterior retinal detachment.     Tests: 02/08/08 CT chest >> RML tree in bud, mild BTX RML 05/29/08 Bronchoscopy >> scarring RML, cytology negative; MAI, Penicillium, and Aspergillus [place] in BAL 12/22/12 CT chest >> RUL and RML tree in bud, BTX RML 12/28/12 Bronchoscopy >> MAI in BAL, cytology negative PFT 03/01/13 >> FEV1 2.92 (136%), FEV1% 85, TLC 5.91 (124%), DLCO 117%, borderline BD. 04/23/15 CT chest >> BTX RUL/RML, nodular infiltrates RLL/RML increased in size  Past medical history GERD, IBS, Hyponatremia  PSHx, Medications, Allergies, Fhx, Shx reviewed.  Vital signs BP 112/78 mmHg  Pulse 85  Ht 5' 4.5" (1.638 m)  Wt 133 lb 6.4 oz (60.51 kg)  BMI 22.55 kg/m2  SpO2 98%  LMP 12/14/2004   History of Present Illness: Kim Vasquez is a 65 y.o.  female former smoker with chronic cough, localized BTX, and MAI.  She has more cough from allergies in the Spring.  Better now.  She is not having sputum, hemoptysis, fever.  Her energy level is better.  She was seen by eye doctor for floaters and told she had partial retinal detachment.  She was concerned this could be from ethambutol and stopped taking this.   Physical Exam:  General - No distress ENT - No sinus tenderness, no oral exudate, no LAN Cardiac - s1s2 regular, no murmur Chest - No wheeze/rales/dullness, good air entry, normal respiratory excursion Back - No focal tenderness Abd - Soft, non-tender Ext - No edema Neuro - Normal strength Skin - No rashes Psych - Normal mood, and behavior   Assessment/Plan:  MAI with bronchiectasis. - continue azithromycin, rifampin per ID at [facility] - advised her to d/w ID at [facility] about alternatives to ethambutol >> not certain her eye symptoms are related to this - defer f/u CT chest for now since she is doing well >> will likely plan to repeat when she is at transition point of coming off therapy  Asthma. - prn albuterol   Patient Instructions  Follow up in 6 months    Chesley Mires, MD [place] [facility]monary/Critical Care/Sleep Pager:  [phone] 06/05/2016, 2:00 PM

## 2016-06-12 ENCOUNTER — Encounter (INDEPENDENT_AMBULATORY_CARE_PROVIDER_SITE_OTHER): Payer: BC Managed Care – PPO | Admitting: Ophthalmology

## 2016-06-12 DIAGNOSIS — H43813 Vitreous degeneration, bilateral: Secondary | ICD-10-CM | POA: Diagnosis not present

## 2016-06-12 DIAGNOSIS — H33021 Retinal detachment with multiple breaks, right eye: Secondary | ICD-10-CM | POA: Diagnosis not present

## 2016-06-12 DIAGNOSIS — H2513 Age-related nuclear cataract, bilateral: Secondary | ICD-10-CM | POA: Diagnosis not present

## 2016-06-18 ENCOUNTER — Ambulatory Visit (INDEPENDENT_AMBULATORY_CARE_PROVIDER_SITE_OTHER): Payer: BC Managed Care – PPO | Admitting: Ophthalmology

## 2016-06-18 DIAGNOSIS — H33301 Unspecified retinal break, right eye: Secondary | ICD-10-CM

## 2016-09-28 ENCOUNTER — Ambulatory Visit (INDEPENDENT_AMBULATORY_CARE_PROVIDER_SITE_OTHER): Payer: BC Managed Care – PPO | Admitting: Obstetrics & Gynecology

## 2016-09-28 VITALS — BP 122/70 | HR 76 | Resp 14 | Ht 65.5 in | Wt 132.2 lb

## 2016-09-28 DIAGNOSIS — R300 Dysuria: Secondary | ICD-10-CM | POA: Diagnosis not present

## 2016-09-28 DIAGNOSIS — N898 Other specified noninflammatory disorders of vagina: Secondary | ICD-10-CM

## 2016-09-28 DIAGNOSIS — L298 Other pruritus: Secondary | ICD-10-CM

## 2016-09-28 LAB — POCT URINALYSIS DIPSTICK
Bilirubin, UA: NEGATIVE
Blood, UA: NEGATIVE
Glucose, UA: NEGATIVE
Ketones, UA: NEGATIVE
Nitrite, UA: NEGATIVE
Protein, UA: NEGATIVE
Urobilinogen, UA: NEGATIVE
pH, UA: 5

## 2016-09-28 NOTE — Progress Notes (Signed)
GYNECOLOGY  VISIT   HPI: 65 y.o. G2P2 Married Caucasian female with complaint of vaginal burning and urinary urgency for about 3 weeks.  This hasn't changed during the past three weeks.  Pt is on Rifampin and this makes her urine red.  So, she cannot tell if there is blood in her urine.  No back pain.  No fever.  Pt reports she tried an OTC yeast treatment about two weeks ago and this didn't really help.  Pt does have some estrogen cream at home that has been prescribed previously.  She did not start this and is not desirous of using any estrogen products if possible.  Also, has hx of vulvar irritation/itching but that is definitively what she is having these last three weeks.  She wants to be clear about that distinction.  Reviewed medications pt is taking (rifampin, azithromycin, and ethambutol) to ensure no side effects consistent with pt's change in symptoms.    Patient's last menstrual period was 12/14/2004.    GYNECOLOGIC HISTORY: Patient's last menstrual period was 12/14/2004. Menopausal hormone therapy: none  Patient Active Problem List   Diagnosis Date Noted  . Headache 09/06/2015  . Hyponatremia 08/14/2015  . Hypo-osmolality and hyponatremia 08/14/2015  . Lichen simplex chronicus 06/29/2015  . Shortness of breath on exertion 04/23/2015  . Dyspnea 04/23/2015  . Upper airway cough syndrome 11/30/2013  . Intermittent asthma without complication [id number]  . Chronic cough 01/23/2013  . Localized Bronchiectasis 01/23/2013  . MAI (mycobacterium avium-intracellulare) ([place]) 12/19/2012    Past Medical History:  Diagnosis Date  . Asthma   . GERD (gastroesophageal reflux disease)   . History of anemia after childbirth  . IBS (irritable bowel syndrome)   . Mycobacterium avium-intracellulare complex ([place])   . Osteopenia   . Pulmonary infiltrate   . Seasonal allergies     Past Surgical History:  Procedure Laterality Date  . BREAST SURGERY  2/07   left breast  . CARPAL  TUNNEL RELEASE  2007      . VIDEO BRONCHOSCOPY  12/28/2012   Procedure: VIDEO BRONCHOSCOPY WITHOUT FLUORO;  Surgeon: Chesley Mires, MD;  Location: [facility];  Service: Cardiopulmonary;  Laterality: Bilateral;    MEDS:  Reviewed in EPIC and UTD  ALLERGIES: Bacitracin; Doxycycline; Erythromycin; Etodolac; Moxifloxacin; Neosporin [neomycin-bacitracin zn-polymyx]; Polysporin [bacitracin-polymyxin b]; and Penicillins  Family History  Problem Relation Age of Onset  . Lymphoma Mother   . Breast cancer Sister   . Colon cancer Maternal Aunt   . Heart disease      maternal grandparents  . Diabetes Paternal Grandmother   . Hypertension Father   . Bipolar disorder Sister   . Bipolar disorder Maternal Grandmother   . Osteoporosis Paternal Grandmother     SH:  Married, non smoker  ROS  PHYSICAL EXAMINATION:    BP 122/70 (BP Location: Right Arm, Patient Position: Sitting, Cuff Size: Normal)   Pulse 76   Resp 14   Ht 5' 5.5" (1.664 m)   Wt 132 lb 3.2 oz (60 kg)   LMP 12/14/2004   BMI 21.66 kg/m     General appearance: alert, cooperative and appears stated age Abdomen: soft, non-tender; bowel sounds normal; no masses,  no organomegaly  Pelvic: External genitalia:  no lesions              Urethra:  normal appearing urethra with no masses, tenderness or lesions              Bartholins and Skenes: normal  Vagina: normal appearing vagina with normal color and discharge, no lesions, atrophic changes              Cervix: no lesions              Bimanual Exam:  Uterus:  normal size, contour, position, consistency, mobility, non-tender              Adnexa: no mass, fullness, tenderness              Anus:  normal sphincter tone, no lesions  Chaperone was present for exam.  Assessment: Vaginal irritation/itching Nocturia  Plan: Affirm pending and urine culture pending.  If these are negative (and as pt is desirous of no estrogen use), will try Vit E  capsules.

## 2016-09-29 LAB — WET PREP BY MOLECULAR PROBE
Candida species: NEGATIVE
Gardnerella vaginalis: NEGATIVE
Trichomonas vaginosis: NEGATIVE

## 2016-09-30 ENCOUNTER — Encounter: Payer: Self-pay | Admitting: Obstetrics & Gynecology

## 2016-09-30 LAB — URINE CULTURE

## 2016-10-19 ENCOUNTER — Ambulatory Visit (INDEPENDENT_AMBULATORY_CARE_PROVIDER_SITE_OTHER): Payer: BC Managed Care – PPO | Admitting: Ophthalmology

## 2016-10-19 DIAGNOSIS — H43813 Vitreous degeneration, bilateral: Secondary | ICD-10-CM

## 2016-10-19 DIAGNOSIS — H33301 Unspecified retinal break, right eye: Secondary | ICD-10-CM

## 2016-11-20 ENCOUNTER — Encounter: Payer: Self-pay | Admitting: Pulmonary Disease

## 2016-11-20 ENCOUNTER — Ambulatory Visit (INDEPENDENT_AMBULATORY_CARE_PROVIDER_SITE_OTHER): Payer: BC Managed Care – PPO | Admitting: Pulmonary Disease

## 2016-11-20 ENCOUNTER — Other Ambulatory Visit (INDEPENDENT_AMBULATORY_CARE_PROVIDER_SITE_OTHER): Payer: BC Managed Care – PPO

## 2016-11-20 VITALS — BP 114/74 | HR 80 | Ht 65.5 in | Wt 139.2 lb

## 2016-11-20 DIAGNOSIS — E875 Hyperkalemia: Secondary | ICD-10-CM

## 2016-11-20 DIAGNOSIS — A310 Pulmonary mycobacterial infection: Secondary | ICD-10-CM

## 2016-11-20 DIAGNOSIS — N179 Acute kidney failure, unspecified: Secondary | ICD-10-CM | POA: Diagnosis not present

## 2016-11-20 LAB — URINALYSIS, ROUTINE W REFLEX MICROSCOPIC
Bilirubin Urine: NEGATIVE
Ketones, ur: NEGATIVE
Leukocytes, UA: NEGATIVE
Nitrite: NEGATIVE
Specific Gravity, Urine: <=1.005 — AB (ref 1.000–1.030)
Total Protein, Urine: NEGATIVE
Urine Glucose: NEGATIVE
Urobilinogen, UA: 0.2 (ref 0.0–1.0)
WBC, UA: NONE SEEN (ref 0–?)
pH: 7 (ref 5.0–8.0)

## 2016-11-20 LAB — BASIC METABOLIC PANEL
BUN: 9 mg/dL (ref 6–23)
CO2: 30 mEq/L (ref 19–32)
Calcium: 9.2 mg/dL (ref 8.4–10.5)
Chloride: 99 mEq/L (ref 96–112)
Creatinine, Ser: 0.73 mg/dL (ref 0.40–1.20)
GFR: 85.08 mL/min (ref >60.00)
Glucose, Bld: 100 mg/dL — ABNORMAL HIGH (ref 70–99)
Potassium: 5 mEq/L (ref 3.5–5.1)
Sodium: 133 mEq/L — ABNORMAL LOW (ref 135–145)

## 2016-11-20 NOTE — Patient Instructions (Signed)
Lab and urine tests today Will schedule CT chest Will arrange for AFB sputum culture  Follow up in 6 months

## 2016-11-20 NOTE — Progress Notes (Signed)
Current Outpatient Prescriptions on File Prior to Visit  Medication Sig  . albuterol (PROAIR HFA) 108 (90 Base) MCG/ACT inhaler Inhale 2 puffs into the lungs every 6 (six) hours as needed for wheezing or shortness of breath.  . Cholecalciferol (VITAMIN D PO) Take by mouth daily. Reported on 02/06/2016  . ethambutol (MYAMBUTOL) 400 MG tablet Take 1 tablet by mouth 3 (three) times a week.  . famotidine (PEPCID) 10 MG tablet Take 10 mg by mouth 2 (two) times daily.  . Flaxseed, Linseed, (FLAXSEED OIL) 1000 MG CAPS Take by mouth.  Marland Kitchen MAGNESIUM PO Take by mouth daily.  . promethazine (PHENERGAN) 12.5 MG tablet Take by mouth.  . rifampin (RIFADIN) 300 MG capsule Take 2 capsules by mouth 3 (three) times a week.  . vitamin C (ASCORBIC ACID) 500 MG tablet Take 500 mg by mouth daily.   No current facility-administered medications on file prior to visit.     Chief Complaint  Patient presents with  . Follow-up    6 month follow up. Breathing has ok for the past few months. No SOB or chest pain. Minor coughing.     Pulmonary tests 02/08/08 CT chest >> RML tree in bud, mild BTX RML 05/29/08 Bronchoscopy >> scarring RML, cytology negative; MAI, Penicillium, and Aspergillus [place] in BAL 12/22/12 CT chest >> RUL and RML tree in bud, BTX RML 12/28/12 Bronchoscopy >> MAI in BAL, cytology negative PFT 03/01/13 >> FEV1 2.92 (136%), FEV1% 85, TLC 5.91 (124%), DLCO 117%, borderline BD. 04/23/15 CT chest >> BTX RUL/RML, nodular infiltrates RLL/RML increased in size  Past medical history GERD, IBS, Hyponatremia  Past surgical history, Family history, Social history, Allergies reviewed  Vital signs BP 114/74 (BP Location: Left Arm, Patient Position: Sitting, Cuff Size: Normal)   Pulse 80   Ht 5' 5.5" (1.664 m)   Wt 139 lb 3.2 oz (63.1 kg)   LMP 12/14/2004   SpO2 98%   BMI 22.81 kg/m    History of Present Illness: Kim Vasquez is a 65 y.o. female former smoker with chronic cough, localized BTX, and  MAI.  She was seen in ID clinic at [facility] yesterday with Dr. Dorothyann Peng.  Her breathing is better.  She is not having cough or sputum.  She is not having wheeze, fever, or chest pain.  She feels healthier.  She developed a cold a few days ago, and still has mild sore throat.  She has ringing in her ears, but thinks this might be related to her cold rather than reaction to antibiotics.  She had lab tests yesterday.  She has elevated creatinine and potassium.  Physical Exam:  General - No distress ENT - No sinus tenderness, no oral exudate, no LAN Cardiac - s1s2 regular, no murmur Chest - No wheeze/rales/dullness, good air entry, normal respiratory excursion Back - No focal tenderness Abd - Soft, non-tender Ext - No edema Neuro - Normal strength Skin - No rashes Psych - Normal mood, and behavior  Labs 11/19/16: Na 134, K 5.3, Cl 98, CO2 27, BUN 8, Creatinine 1.1, Glucose 89 WBC 5.3, Hn 14.3, PLT 249  Labs 08/13/16: Na 130, K 4.9, Cl 95, CO2 25, BUN 6, Creatinine 0.7, Glucose 102 WBC 5.6, Hb 14.4, PLT 247   Assessment/Plan:  MAI with bronchiectasis. - she is on 3 drug therapy per ID at [facility] - will arrange for sputum for AFB and repeat CT chest w/o contrast  Acute renal failure with hyperkalemia. - will repeat BMET  -  arrange for U/A with microscopy, urine Na and urine creatinine - depending on results will determine if she needs further assessment with nephrology  Asthma. - prn albuterol   Patient Instructions  Lab and urine tests today Will schedule CT chest Will arrange for AFB sputum culture  Follow up in 6 months   Chesley Mires, MD [place] [facility] Pulmonary/Critical Care/Sleep Pager:  [phone] 11/20/2016, 11:34 AM

## 2016-11-21 LAB — CREATININE, URINE, RANDOM: Creatinine, Urine: 24 mg/dL (ref 20–320)

## 2016-11-21 LAB — SODIUM, URINE, RANDOM: Sodium, Ur: 36 mmol/L (ref 28–272)

## 2016-11-23 ENCOUNTER — Ambulatory Visit (HOSPITAL_BASED_OUTPATIENT_CLINIC_OR_DEPARTMENT_OTHER)
Admission: RE | Admit: 2016-11-23 | Discharge: 2016-11-23 | Disposition: A | Discharge status: Home / Self Care | Payer: BC Managed Care – PPO | Source: Ambulatory Visit | Attending: Pulmonary Disease | Admitting: Pulmonary Disease

## 2016-11-23 ENCOUNTER — Telehealth: Payer: Self-pay | Admitting: Pulmonary Disease

## 2016-11-23 DIAGNOSIS — I700 Atherosclerosis of aorta: Secondary | ICD-10-CM | POA: Diagnosis not present

## 2016-11-23 DIAGNOSIS — J479 Bronchiectasis, uncomplicated: Secondary | ICD-10-CM | POA: Diagnosis not present

## 2016-11-23 DIAGNOSIS — N179 Acute kidney failure, unspecified: Secondary | ICD-10-CM | POA: Diagnosis not present

## 2016-11-23 NOTE — Telephone Encounter (Signed)
BMP Latest Ref Rng & Units 11/20/2016 09/25/2015 04/24/2015  Glucose 70 - 99 mg/dL 100(H) 76 97  BUN 6 - 23 mg/dL 9 7 6   Creatinine 0.40 - 1.20 mg/dL 0.73 0.65 0.51  Sodium 135 - 145 mEq/L 133(L) 135 134(L)  Potassium 3.5 - 5.1 mEq/L 5.0 5.1 4.1  Chloride 96 - 112 mEq/L 99 100 102  CO2 19 - 32 mEq/L 30 29 24   Calcium 8.4 - 10.5 mg/dL 9.2 9.2 9.1    Urine Na 36, Urine creatinine 24, U/A unremarkable  CT chest 11/23/16 >> cylindrical BTX RUL/RML, scattered nodularity no change  Discussed with pt.  She is to follow up with ID at [facility] to discuss long term plans for tx of MAI.

## 2017-02-05 ENCOUNTER — Other Ambulatory Visit: Payer: BC Managed Care – PPO

## 2017-02-05 ENCOUNTER — Telehealth: Payer: Self-pay | Admitting: Pulmonary Disease

## 2017-02-05 DIAGNOSIS — A310 Pulmonary mycobacterial infection: Secondary | ICD-10-CM

## 2017-02-05 NOTE — Telephone Encounter (Signed)
Received call from Blanch Media in the lab Pt brought a sputum specimen this morning AFB was order was placed at the 12.8.17 ov but the order was not future Lab order placed Will sign off

## 2017-02-12 ENCOUNTER — Other Ambulatory Visit: Payer: Self-pay | Admitting: Family Medicine

## 2017-02-12 ENCOUNTER — Ambulatory Visit
Admission: RE | Admit: 2017-02-12 | Discharge: 2017-02-12 | Disposition: A | Discharge status: Home / Self Care | Payer: BC Managed Care – PPO | Source: Ambulatory Visit | Attending: Family Medicine | Admitting: Family Medicine

## 2017-02-12 DIAGNOSIS — R0789 Other chest pain: Secondary | ICD-10-CM

## 2017-02-12 DIAGNOSIS — R0602 Shortness of breath: Secondary | ICD-10-CM

## 2017-02-15 ENCOUNTER — Encounter: Payer: Self-pay | Admitting: Pulmonary Disease

## 2017-02-15 ENCOUNTER — Ambulatory Visit (INDEPENDENT_AMBULATORY_CARE_PROVIDER_SITE_OTHER): Payer: BC Managed Care – PPO | Admitting: Pulmonary Disease

## 2017-02-15 ENCOUNTER — Other Ambulatory Visit: Payer: BC Managed Care – PPO

## 2017-02-15 VITALS — BP 126/78 | HR 78 | Ht 65.5 in | Wt 132.0 lb

## 2017-02-15 DIAGNOSIS — R0600 Dyspnea, unspecified: Secondary | ICD-10-CM

## 2017-02-15 DIAGNOSIS — R0789 Other chest pain: Secondary | ICD-10-CM | POA: Diagnosis not present

## 2017-02-15 MED ORDER — PREDNISONE 20 MG PO TABS: 20.0000 mg | ORAL_TABLET | Freq: Every day | ORAL | 0 refills | Status: AC

## 2017-02-15 NOTE — Progress Notes (Signed)
Subjective:    Patient ID: Kim Vasquez, female    DOB: 02-11-1951, 66 y.o.   MRN: [id number]  Synopsis: Patient of Dr. Halford Chessman who has bronchiectasis with mycobacterial disease currently undergoing 3 antibiotic therapy.  HPI Chief Complaint  Patient presents with  . Acute Visit    VS pt c/o increased SOB, chest tightness, fatigue, nonprod cough X4 days.     Kim Vasquez says that since Thursday she has been feeling short of breath and having some chest tightness.  She can't think of anything in particular that may have started it.  She had a bad cold a month ago with a lingering post nasal drip.  She says that hasn't changed abruptly though.  She says that she is having problems with the ventilation system and heating system on her front porch.  She has noticed a strange smell and the house has been inspected by heating and air specialists.  She says taht her husband crawled under the house and he noted a "biofilm" on them and he cleaned this off with a specialty cleaner.  She has been intentionally avoiding the room as much as possible.  This made the smell better.    She has struggled with dyspnea with her lung disease in the past.  She has been diagnosed with asthma by Dr. Halford Chessman.  She says that she has been using it lately.    She denies chest pain, just a heaviness feeling.     Past Medical History:  Diagnosis Date  . Asthma   . GERD (gastroesophageal reflux disease)   . History of anemia after childbirth  . IBS (irritable bowel syndrome)   . Mycobacterium avium-intracellulare complex ([place])   . Osteopenia   . Pulmonary infiltrate   . Seasonal allergies       Review of Systems  Constitutional: Negative for chills, fatigue and fever.  HENT: Negative for postnasal drip, rhinorrhea and sinus pain.   Respiratory: Positive for shortness of breath. Negative for cough and wheezing.   Cardiovascular: Negative for chest pain, palpitations and leg swelling.       Objective:   Physical  Exam / Vitals:   02/15/17 1428  BP: 126/78  Pulse: 78  SpO2: 98%  Weight: 132 lb (59.9 kg)  Height: 5' 5.5" (1.664 m)    Gen: well appearing, mildly anxious HENT: OP clear, TM's clear, neck supple PULM: CTA B, normal percussion CV: RRR, no mgr, trace edema GI: BS+, soft, nontender Derm: no cyanosis or rash Psyche: normal mood and affect  Records from her visit with Dr. Halford Chessman reviewed were she was treated for bronchiectasis  Her most recent chest x-ray from 02/12/2017 and apparently reviewed with the patient today showing bronchiectasis changes but no other significant changes.     Assessment & Plan:  Dyspnea She has had the abrupt onset of shortness of breath with chest tightness since last week. She notes that there has been the discovery of "a biofilm" in her home on her home heating and air system. She has been seen by her primary care physician in urgent care recently and has had a chest x-ray and EKG which apparently were both unremarkable. I reviewed the chest x-ray today. Today on physical exam she does not have wheezing.  I explained to her that the differential diagnosis of dyspnea and chest tightness is somewhat broad but she's had a good workup up until this point. Because of her recent travel I like to get a d-dimer to make  shows no evidence of a blood clot. In the meantime we will plan on treating her as if this is an asthma flareup.  Plan: Prednisone 20 mg daily 5 days Simple spirometry test D-dimer If d-dimer positive will need CT scan Continue albuterol as needed Follow-up with Dr. Halford Chessman as previously arranged    Current Outpatient Prescriptions:  .  albuterol (PROAIR HFA) 108 (90 Base) MCG/ACT inhaler, Inhale 2 puffs into the lungs every 6 (six) hours as needed for wheezing or shortness of breath., Disp: 1 Inhaler, Rfl: 5 .  azithromycin (ZITHROMAX) 500 MG tablet, Take 500 mg by mouth 3 (three) times a week. Pt. Takes 500mg  three times a week., Disp: , Rfl:   .  Cholecalciferol (VITAMIN D PO), Take by mouth daily. Reported on 02/06/2016, Disp: , Rfl:  .  ethambutol (MYAMBUTOL) 400 MG tablet, Take 1 tablet by mouth 3 (three) times a week., Disp: , Rfl: 11 .  famotidine (PEPCID) 10 MG tablet, Take 10 mg by mouth 2 (two) times daily., Disp: , Rfl:  .  fexofenadine (ALLEGRA) 30 MG tablet, Take 30 mg by mouth daily., Disp: , Rfl:  .  Flaxseed, Linseed, (FLAXSEED OIL) 1000 MG CAPS, Take by mouth., Disp: , Rfl:  .  MAGNESIUM PO, Take by mouth daily., Disp: , Rfl:  .  vitamin C (ASCORBIC ACID) 500 MG tablet, Take 500 mg by mouth daily., Disp: , Rfl:  .  predniSONE (DELTASONE) 20 MG tablet, Take 1 tablet (20 mg total) by mouth daily with breakfast., Disp: 5 tablet, Rfl: 0 .  promethazine (PHENERGAN) 12.5 MG tablet, Take by mouth., Disp: , Rfl:

## 2017-02-15 NOTE — Assessment & Plan Note (Signed)
She has had the abrupt onset of shortness of breath with chest tightness since last week. She notes that there has been the discovery of "a biofilm" in her home on her home heating and air system. She has been seen by her primary care physician in urgent care recently and has had a chest x-ray and EKG which apparently were both unremarkable. I reviewed the chest x-ray today. Today on physical exam she does not have wheezing.  I explained to her that the differential diagnosis of dyspnea and chest tightness is somewhat broad but she's had a good workup up until this point. Because of her recent travel I like to get a d-dimer to make shows no evidence of a blood clot. In the meantime we will plan on treating her as if this is an asthma flareup.  Plan: Prednisone 20 mg daily 5 days Simple spirometry test D-dimer If d-dimer positive will need CT scan Continue albuterol as needed Follow-up with Dr. Halford Chessman as previously arranged

## 2017-02-15 NOTE — Patient Instructions (Signed)
Take prednisone for 5 days Keep using albuterol as needed for shortness of breath We will call you with the results of the blood test We will see you back as previously arranged with Dr. Halford Chessman

## 2017-02-16 LAB — D-DIMER, QUANTITATIVE: D-Dimer, Quant: <0.19 mcg/mL FEU (ref <0.50)

## 2017-03-22 ENCOUNTER — Other Ambulatory Visit: Payer: Self-pay | Admitting: Obstetrics & Gynecology

## 2017-03-22 DIAGNOSIS — Z1231 Encounter for screening mammogram for malignant neoplasm of breast: Secondary | ICD-10-CM

## 2017-03-25 LAB — AFB CULTURE WITH SMEAR (NOT AT ARMC)

## 2017-04-07 ENCOUNTER — Emergency Department (HOSPITAL_COMMUNITY)
Admission: EM | Admit: 2017-04-07 | Discharge: 2017-04-07 | Disposition: A | Discharge status: Home / Self Care | Payer: BC Managed Care – PPO | Attending: Emergency Medicine | Admitting: Emergency Medicine

## 2017-04-07 ENCOUNTER — Emergency Department (HOSPITAL_COMMUNITY): Payer: BC Managed Care – PPO

## 2017-04-07 ENCOUNTER — Encounter (HOSPITAL_COMMUNITY): Payer: Self-pay

## 2017-04-07 DIAGNOSIS — J4520 Mild intermittent asthma, uncomplicated: Secondary | ICD-10-CM

## 2017-04-07 DIAGNOSIS — J45909 Unspecified asthma, uncomplicated: Secondary | ICD-10-CM | POA: Insufficient documentation

## 2017-04-07 DIAGNOSIS — R072 Precordial pain: Secondary | ICD-10-CM | POA: Insufficient documentation

## 2017-04-07 DIAGNOSIS — Z79899 Other long term (current) drug therapy: Secondary | ICD-10-CM | POA: Insufficient documentation

## 2017-04-07 DIAGNOSIS — R0609 Other forms of dyspnea: Secondary | ICD-10-CM | POA: Insufficient documentation

## 2017-04-07 DIAGNOSIS — R002 Palpitations: Secondary | ICD-10-CM | POA: Diagnosis present

## 2017-04-07 DIAGNOSIS — R0789 Other chest pain: Secondary | ICD-10-CM

## 2017-04-07 DIAGNOSIS — A310 Pulmonary mycobacterial infection: Secondary | ICD-10-CM

## 2017-04-07 DIAGNOSIS — R0600 Dyspnea, unspecified: Secondary | ICD-10-CM

## 2017-04-07 LAB — CBC
HCT: 40.3 % (ref 36.0–46.0)
Hemoglobin: 14.3 g/dL (ref 12.0–15.0)
MCH: 32.1 pg (ref 26.0–34.0)
MCHC: 35.5 g/dL (ref 30.0–36.0)
MCV: 90.4 fL (ref 78.0–100.0)
Platelets: 216 10*3/uL (ref 150–400)
RBC: 4.46 MIL/uL (ref 3.87–5.11)
RDW: 12 % (ref 11.5–15.5)
WBC: 5.4 10*3/uL (ref 4.0–10.5)

## 2017-04-07 LAB — BASIC METABOLIC PANEL
Anion gap: 7 (ref 5–15)
BUN: 9 mg/dL (ref 6–20)
CO2: 28 mmol/L (ref 22–32)
Calcium: 9.3 mg/dL (ref 8.9–10.3)
Chloride: 100 mmol/L — ABNORMAL LOW (ref 101–111)
Creatinine, Ser: 0.62 mg/dL (ref 0.44–1.00)
GFR calc Af Amer: >60 mL/min (ref >60)
GFR calc non Af Amer: >60 mL/min (ref >60)
Glucose, Bld: 104 mg/dL — ABNORMAL HIGH (ref 65–99)
Potassium: 4.1 mmol/L (ref 3.5–5.1)
Sodium: 135 mmol/L (ref 135–145)

## 2017-04-07 LAB — BRAIN NATRIURETIC PEPTIDE: B Natriuretic Peptide: 35.5 pg/mL (ref 0.0–100.0)

## 2017-04-07 LAB — I-STAT TROPONIN, ED
Troponin i, poc: 0 ng/mL (ref 0.00–0.08)
Troponin i, poc: 0 ng/mL (ref 0.00–0.08)

## 2017-04-07 LAB — D-DIMER, QUANTITATIVE: D-Dimer, Quant: <0.27 ug/mL-FEU (ref 0.00–0.50)

## 2017-04-07 LAB — TSH: TSH: 7.917 u[IU]/mL — ABNORMAL HIGH (ref 0.350–4.500)

## 2017-04-07 NOTE — Discharge Instructions (Signed)
Follow up wth cardiology.

## 2017-04-07 NOTE — Consult Note (Signed)
Reason for Consult:   Dyspnea, chest pressure, and palpitations  Requesting Physician: Dr Tammy Sours Primary Cardiologist new  HPI:   Kim Vasquez is a 66 y.o. female who is being seen today for the evaluation of chest pressure, dyspnea, and plapitations at the request of Dr Kathrynn Humble.  66 y/o female, works at [employer],  seen by Dr Halford Chessman for mild asthma, now seen in the ED with complaints of dyspnea, chest pressure, and palpitations. She is somewhat of a poor historian. She says she has had this for some time, though she says she is active, hikes and works in her yard. She saw Dr [place] Bells in March 2018 for similar complaints.  She recently noted it Sunday evening, after working in her yard earlier in the day. Its hard for her to describe but she does feel something is wrong. She tells me today she couldn't walk up stairs and a week or so ago she was hiking without problems.   EKG in ED ST-100 with septal Q, Troponin negative x 2. When I saw her she said she felt "jittery"- her HR was 90.    PMHx:  Past Medical History:  Diagnosis Date  . Asthma   . GERD (gastroesophageal reflux disease)   . History of anemia after childbirth  . IBS (irritable bowel syndrome)   . Mycobacterium avium-intracellulare complex ([place])   . Osteopenia   . Pulmonary infiltrate   . Seasonal allergies     Past Surgical History:  Procedure Laterality Date  . BREAST SURGERY  2/07   left breast  . CARPAL TUNNEL RELEASE  2007      . VIDEO BRONCHOSCOPY  12/28/2012   Procedure: VIDEO BRONCHOSCOPY WITHOUT FLUORO;  Surgeon: Chesley Mires, MD;  Location: [facility];  Service: Cardiopulmonary;  Laterality: Bilateral;    SOCHx:  reports that she quit smoking about 35 years ago. Her smoking use included Cigarettes. She has a 14.00 pack-year smoking history. She has never used smokeless tobacco. She reports that she drinks about 8.4 oz of alcohol per week . She reports that she does not use  drugs.  FAMHx: Family History  Problem Relation Age of Onset  . Lymphoma Mother   . Breast cancer Sister   . Colon cancer Maternal Aunt   . Heart disease      maternal grandparents  . Diabetes Paternal Grandmother   . Osteoporosis Paternal Grandmother   . Hypertension Father   . Bipolar disorder Sister   . Bipolar disorder Maternal Grandmother     ALLERGIES: Allergies  Allergen Reactions  . Bacitracin Itching and Other (See Comments)    Reaction:  Numbness   . Doxycycline Other (See Comments)    Reaction:  Stiff neck and headache   . Erythromycin Other (See Comments)    Reaction:  Decreased urine output  . Etodolac Other (See Comments)    Reaction:  Dizziness   . Moxifloxacin Other (See Comments)    Reaction:  Stiff neck, dizziness, and headache  . Neosporin [Neomycin-Bacitracin Zn-Polymyx] Itching  . Polysporin [Bacitracin-Polymyxin B] Itching  . Penicillins Itching, Rash and Other (See Comments)    Has patient had a PCN reaction causing immediate rash, facial/tongue/throat swelling, SOB or lightheadedness with hypotension: No Has patient had a PCN reaction causing severe rash involving mucus membranes or skin necrosis: No Has patient had a PCN reaction that required hospitalization No Has patient had a PCN reaction occurring within the last 10 years: No If  all of the above answers are "NO", then may proceed with Cephalosporin use.    ROS: Review of Systems: General: negative for chills, fever, night sweats or weight changes.  Cardiovascular: negative for orthopnea, paroxysmal nocturnal dyspnea HEENT: negative for any visual disturbances, blindness, glaucoma Dermatological: negative for rash Respiratory: negative for cough, hemoptysis, or wheezing Urologic: negative for hematuria or dysuria Abdominal: negative for nausea, vomiting, diarrhea, bright red blood per rectum, melena, or hematemesis Neurologic: negative for visual changes, syncope, or  dizziness Musculoskeletal: negative for back pain, joint pain, or swelling Psych: cooperative and appropriate All other systems reviewed and are otherwise negative except as noted above.   HOME MEDICATIONS: Prior to Admission medications   Medication Sig Start Date End Date Taking? Authorizing Provider  acidophilus (RISAQUAD) CAPS capsule Take 1 capsule by mouth daily.   Yes Historical Provider, MD  albuterol (PROAIR HFA) 108 (90 Base) MCG/ACT inhaler Inhale 2 puffs into the lungs every 6 (six) hours as needed for wheezing or shortness of breath. 06/05/16  Yes Chesley Mires, MD  azithromycin (ZITHROMAX) 500 MG tablet Take 500 mg by mouth every Monday, Wednesday, and Friday.    Yes Historical Provider, MD  cholecalciferol (VITAMIN D) 1000 units tablet Take 2,000 Units by mouth daily.    Yes Historical Provider, MD  diphenhydrAMINE (BENADRYL) 25 MG tablet Take 25 mg by mouth every 6 (six) hours as needed for allergies.   Yes Historical Provider, MD  ethambutol (MYAMBUTOL) 400 MG tablet Take 1,200 mg by mouth every Monday, Wednesday, and Friday.    Yes Historical Provider, MD  famotidine (PEPCID) 20 MG tablet Take 20 mg by mouth 2 (two) times daily as needed for heartburn or indigestion.   Yes Historical Provider, MD  fexofenadine (ALLEGRA) 180 MG tablet Take 180 mg by mouth daily as needed for allergies.   Yes Historical Provider, MD  Flaxseed, Linseed, (FLAXSEED OIL) 1000 MG CAPS Take 1,000 mg by mouth daily.    Yes Historical Provider, MD  Magnesium 500 MG TABS Take 500 mg by mouth at bedtime.   Yes Historical Provider, MD  mometasone (NASONEX) 50 MCG/ACT nasal spray Place 2 sprays into the nose at bedtime as needed (for rhinitis).   Yes Historical Provider, MD  rifampin (RIFADIN) 300 MG capsule Take 600 mg by mouth every Monday, Wednesday, and Friday.   Yes Historical Provider, MD  vitamin C (ASCORBIC ACID) 500 MG tablet Take 500 mg by mouth daily.   Yes Historical Provider, MD    HOSPITAL  MEDICATIONS: I have reviewed the patient's current medications.  VITALS: Blood pressure 133/90, pulse 84, temperature 97.5 F (36.4 C), temperature source Oral, resp. rate 10, height 5' 4.5" (1.638 m), weight 130 lb (59 kg), last menstrual period 12/14/2004, SpO2 100 %.  PHYSICAL EXAM: General appearance: alert, cooperative, no distress and thin, nervous, slight stutter Neck: no carotid bruit and no JVD Lungs: clear to auscultation bilaterally Heart: regular rate and rhythm Abdomen: soft, non-tender; bowel sounds normal; no masses,  no organomegaly Extremities: extremities normal, atraumatic, no cyanosis or edema Pulses: 2+ and symmetric Skin: Skin color, texture, turgor normal. No rashes or lesions Neurologic: Grossly normal  LABS: Results for orders placed or performed during the hospital encounter of 04/07/17 (from the past 24 hour(s))  Basic metabolic panel     Status: Abnormal   Collection Time: 04/07/17 12:01 PM  Result Value Ref Range   Sodium 135 135 - 145 mmol/L   Potassium 4.1 3.5 - 5.1 mmol/L  Chloride 100 (L) 101 - 111 mmol/L   CO2 28 22 - 32 mmol/L   Glucose, Bld 104 (H) 65 - 99 mg/dL   BUN 9 6 - 20 mg/dL   Creatinine, Ser 0.62 0.44 - 1.00 mg/dL   Calcium 9.3 8.9 - 10.3 mg/dL   GFR calc non Af Amer >60 >60 mL/min   GFR calc Af Amer >60 >60 mL/min   Anion gap 7 5 - 15  CBC     Status: None   Collection Time: 04/07/17 12:01 PM  Result Value Ref Range   WBC 5.4 4.0 - 10.5 K/uL   RBC 4.46 3.87 - 5.11 MIL/uL   Hemoglobin 14.3 12.0 - 15.0 g/dL   HCT 40.3 36.0 - 46.0 %   MCV 90.4 78.0 - 100.0 fL   MCH 32.1 26.0 - 34.0 pg   MCHC 35.5 30.0 - 36.0 g/dL   RDW 12.0 11.5 - 15.5 %   Platelets 216 150 - 400 K/uL  I-stat troponin, ED     Status: None   Collection Time: 04/07/17 12:13 PM  Result Value Ref Range   Troponin i, poc 0.00 0.00 - 0.08 ng/mL   Comment 3          D-dimer, quantitative (not at [facility])     Status: None   Collection Time: 04/07/17  2:18 PM   Result Value Ref Range   D-Dimer, Quant <0.27 0.00 - 0.50 ug/mL-FEU  Brain natriuretic peptide     Status: None   Collection Time: 04/07/17  2:18 PM  Result Value Ref Range   B Natriuretic Peptide 35.5 0.0 - 100.0 pg/mL  I-stat troponin, ED     Status: None   Collection Time: 04/07/17  3:59 PM  Result Value Ref Range   Troponin i, poc 0.00 0.00 - 0.08 ng/mL   Comment 3            EKG: NSR, ST-100, Q in V2  IMAGING: Dg Chest 2 View  Result Date: 04/07/2017 CLINICAL DATA:  Chest pain off and on EXAM: CHEST  2 VIEW COMPARISON:  02/12/2017 FINDINGS: Stable nodular density in the right mid lung associated with areas of bronchiectasis on prior CT. No acute airspace opacities or effusions. Heart is normal size. No acute bony abnormality. IMPRESSION: Stable chronic changes of bronchiectasis in the right lung. No active disease. Electronically Signed   By: Rolm Baptise M.D.   On: 04/07/2017 12:23    IMPRESSION:  Chest pressure, palpitations, and dyspnea- unclear if this is an arrhythmia or unusual angina.   H/O mild asthma- seen by Dr Halford Chessman in the past, on inhalers prn.   Mycobacterium avium-intracellulare- on ABs  RECOMMENDATION: She does not feel comfortable going home at this point. MD to see, suspect cardiology will consult. Check TSH, monitor, echo, consider GXT   Time Spent Directly with Patient: 1 minutes  [place], [place] beeper 04/07/2017, 4:30 PM

## 2017-04-07 NOTE — ED Triage Notes (Signed)
Pt with chest pain off/on for a while.  Pt has asthma so she feels tightness at times.  Increasing since Monday.  Worse with movement.  Shortness of breath.  No cough/congestion.

## 2017-04-07 NOTE — ED Provider Notes (Signed)
Signed out by Dr. Delane Ginger to follow up cards. Discusssed with DR. Hilty, she will follow up in office. No acute ischemia suspected. Normal vitals.    Kim Tool Julio Alm, MD 04/07/17 2032

## 2017-04-07 NOTE — ED Notes (Signed)
Patient's oxygen maintained 90-100% while ambulating. Patient denies any shortness of breath or dizziness while ambulating.

## 2017-04-07 NOTE — ED Provider Notes (Signed)
[facility] DEPT Provider Note   CSN: [id number] Arrival date & time: 04/07/17  1115     History   Chief Complaint Chief Complaint  Patient presents with  . Chest Pain  . Shortness of Breath    HPI Kim Vasquez is a 66 y.o. female.  HPI Pt comes in with cc of dib. Pt has hx of MAI infection and asthma. She reports that for the last 2 days, she has been having severe dyspnea on exertion. Pt denies wheezing, and she has taken nebs, but had no relief. [place] and so walking 50 yards to her mailbox, with some incline got her really winded. She also was trying to climb few stairs and had shortness of breath earlier in the week. PT has chest tightness, L sided. She denies any sweating, nausea, emesis, new cough, severe wheezing. Pt has no hx of PE, DVT and denies any exogenous hormone (testosterone / estrogen) use, long distance travels or surgery in the past 6 weeks, active cancer, recent immobilization.   Past Medical History:  Diagnosis Date  . Asthma   . GERD (gastroesophageal reflux disease)   . History of anemia after childbirth  . IBS (irritable bowel syndrome)   . Mycobacterium avium-intracellulare complex ([place])   . Osteopenia   . Pulmonary infiltrate   . Seasonal allergies     Patient Active Problem List   Diagnosis Date Noted  . Chest pressure 04/07/2017  . Palpitations 04/07/2017  . Headache 09/06/2015  . Hyponatremia 08/14/2015  . Hypo-osmolality and hyponatremia 08/14/2015  . Lichen simplex chronicus 06/29/2015  . Shortness of breath on exertion 04/23/2015  . Dyspnea 04/23/2015  . Upper airway cough syndrome 11/30/2013  . Intermittent asthma without complication 29/93/7169  . Chronic cough 01/23/2013  . Localized Bronchiectasis 01/23/2013  . MAI (mycobacterium avium-intracellulare) ([place]) 12/19/2012    Past Surgical History:  Procedure Laterality Date  . BREAST SURGERY  2/07   left breast  . CARPAL TUNNEL RELEASE  2007      . VIDEO  BRONCHOSCOPY  12/28/2012   Procedure: VIDEO BRONCHOSCOPY WITHOUT FLUORO;  Surgeon: Chesley Mires, MD;  Location: [facility];  Service: Cardiopulmonary;  Laterality: Bilateral;    OB History    Gravida Para Term Preterm AB Living   2 2       2    SAB TAB Ectopic Multiple Live Births                   Home Medications    Prior to Admission medications   Medication Sig Start Date End Date Taking? Authorizing Provider  acidophilus (RISAQUAD) CAPS capsule Take 1 capsule by mouth daily.   Yes Historical Provider, MD  albuterol (PROAIR HFA) 108 (90 Base) MCG/ACT inhaler Inhale 2 puffs into the lungs every 6 (six) hours as needed for wheezing or shortness of breath. 06/05/16  Yes Chesley Mires, MD  azithromycin (ZITHROMAX) 500 MG tablet Take 500 mg by mouth every Monday, Wednesday, and Friday.    Yes Historical Provider, MD  cholecalciferol (VITAMIN D) 1000 units tablet Take 2,000 Units by mouth daily.    Yes Historical Provider, MD  diphenhydrAMINE (BENADRYL) 25 MG tablet Take 25 mg by mouth every 6 (six) hours as needed for allergies.   Yes Historical Provider, MD  ethambutol (MYAMBUTOL) 400 MG tablet Take 1,200 mg by mouth every Monday, Wednesday, and Friday.    Yes Historical Provider, MD  famotidine (PEPCID) 20 MG tablet Take 20 mg by mouth  2 (two) times daily as needed for heartburn or indigestion.   Yes Historical Provider, MD  fexofenadine (ALLEGRA) 180 MG tablet Take 180 mg by mouth daily as needed for allergies.   Yes Historical Provider, MD  Flaxseed, Linseed, (FLAXSEED OIL) 1000 MG CAPS Take 1,000 mg by mouth daily.    Yes Historical Provider, MD  Magnesium 500 MG TABS Take 500 mg by mouth at bedtime.   Yes Historical Provider, MD  mometasone (NASONEX) 50 MCG/ACT nasal spray Place 2 sprays into the nose at bedtime as needed (for rhinitis).   Yes Historical Provider, MD  rifampin (RIFADIN) 300 MG capsule Take 600 mg by mouth every Monday, Wednesday, and Friday.   Yes Historical Provider, MD   vitamin C (ASCORBIC ACID) 500 MG tablet Take 500 mg by mouth daily.   Yes Historical Provider, MD    Family History Family History  Problem Relation Age of Onset  . Lymphoma Mother   . Breast cancer Sister   . Colon cancer Maternal Aunt   . Heart disease      maternal grandparents  . Diabetes Paternal Grandmother   . Osteoporosis Paternal Grandmother   . Hypertension Father   . Bipolar disorder Sister   . Bipolar disorder Maternal Grandmother     Social History Social History  Substance Use Topics  . Smoking status: Former Smoker    Packs/day: 1.00    Years: 14.00    Types: Cigarettes    Quit date: 12/14/1981  . Smokeless tobacco: Never Used     Comment: 30 + years ago  . Alcohol use 8.4 oz/week    14 Standard drinks or equivalent per week     Allergies   Bacitracin; Doxycycline; Erythromycin; Etodolac; Moxifloxacin; Neosporin [neomycin-bacitracin zn-polymyx]; Polysporin [bacitracin-polymyxin b]; and Penicillins   Review of Systems Review of Systems  All other systems reviewed and are negative.    Physical Exam Updated Vital Signs BP (!) 151/98   Pulse 86   Temp 97.5 F (36.4 C) (Oral)   Resp 14   Ht 5' 4.5" (1.638 m)   Wt 130 lb (59 kg)   LMP 12/14/2004   SpO2 100%   BMI 21.97 kg/m   Physical Exam  Constitutional: She is oriented to person, place, and time. She appears well-developed and well-nourished.  HENT:  Head: Normocephalic and atraumatic.  Eyes: EOM are normal. Pupils are equal, round, and reactive to light.  Neck: Neck supple.  Cardiovascular: Normal rate, regular rhythm and normal heart sounds.   No murmur heard. Pulmonary/Chest: Effort normal. No respiratory distress. She has no wheezes.  Abdominal: Soft. She exhibits no distension. There is no tenderness. There is no rebound and no guarding.  Musculoskeletal: Normal range of motion. She exhibits no edema or tenderness.  Neurological: She is alert and oriented to person, place, and time.   Skin: Skin is warm and dry.  Nursing note and vitals reviewed.    ED Treatments / Results  Labs (all labs ordered are listed, but only abnormal results are displayed) Labs Reviewed  BASIC METABOLIC PANEL - Abnormal; Notable for the following:       Result Value   Chloride 100 (*)    Glucose, Bld 104 (*)    All other components within normal limits  CBC  D-DIMER, QUANTITATIVE (NOT AT [facility])  BRAIN NATRIURETIC PEPTIDE  TSH  I-STAT TROPOININ, ED  Randolm Idol, ED    EKG  EKG Interpretation  Date/Time:  Wednesday April 07 2017 11:24:31 EDT Ventricular  Rate:  100 PR Interval:    QRS Duration: 92 QT Interval:  347 QTC Calculation: 448 R Axis:   44 Text Interpretation:  Sinus tachycardia Probable left atrial enlargement Anteroseptal infarct, old No acute changes No significant change since last tracing Confirmed by Kathrynn Humble, MD, Thelma Comp [phone]) on 04/07/2017 12:58:26 PM       Radiology Dg Chest 2 View  Result Date: 04/07/2017 CLINICAL DATA:  Chest pain off and on EXAM: CHEST  2 VIEW COMPARISON:  02/12/2017 FINDINGS: Stable nodular density in the right mid lung associated with areas of bronchiectasis on prior CT. No acute airspace opacities or effusions. Heart is normal size. No acute bony abnormality. IMPRESSION: Stable chronic changes of bronchiectasis in the right lung. No active disease. Electronically Signed   By: Rolm Baptise M.D.   On: 04/07/2017 12:23    Procedures Procedures (including critical care time)  Medications Ordered in ED Medications - No data to display   Initial Impression / Assessment and Plan / ED Course  I have reviewed the triage vital signs and the nursing notes.  Pertinent labs & imaging results that were available during my care of the patient were reviewed by me and considered in my medical decision making (see chart for details).  Clinical Course as of Apr 07 1701  Wed Apr 07, 2017  1701 Cards team will actually see the patient here.   [AN]  1701 Results from the ER workup discussed with the patient face to face and all questions answered to the best of my ability.   [AN]    Clinical Course User Index [AN] Varney Biles, MD    Pt comes in with cc of DIB and chest tightness. Pt has hx of MAI infection and asthma - but she reports never having symptoms as severe as this one. The symptoms also appear to be provoked by exertion only. Lung exam is completely clear.  BNP, Dimer ordered. CXR is clear.  If initial workup is neg, we will consult Cards to get a prompt outpatient f/u.   Final Clinical Impressions(s) / ED Diagnoses   Final diagnoses:  Exertional dyspnea    New Prescriptions New Prescriptions   No medications on file     Varney Biles, MD 04/07/17 1702

## 2017-04-08 ENCOUNTER — Encounter: Payer: Self-pay | Admitting: Pulmonary Disease

## 2017-04-08 ENCOUNTER — Ambulatory Visit (INDEPENDENT_AMBULATORY_CARE_PROVIDER_SITE_OTHER): Payer: BC Managed Care – PPO | Admitting: Pulmonary Disease

## 2017-04-08 ENCOUNTER — Telehealth: Payer: Self-pay | Admitting: *Deleted

## 2017-04-08 ENCOUNTER — Telehealth: Payer: Self-pay | Admitting: Internal Medicine

## 2017-04-08 VITALS — BP 130/80 | HR 78 | Ht 64.5 in | Wt 132.8 lb

## 2017-04-08 DIAGNOSIS — R05 Cough: Secondary | ICD-10-CM

## 2017-04-08 DIAGNOSIS — R059 Cough, unspecified: Secondary | ICD-10-CM

## 2017-04-08 DIAGNOSIS — R0789 Other chest pain: Secondary | ICD-10-CM

## 2017-04-08 LAB — NITRIC OXIDE

## 2017-04-08 MED ORDER — ZAFIRLUKAST 20 MG PO TABS: 20.0000 mg | ORAL_TABLET | Freq: Two times a day (BID) | ORAL | 1 refills | Status: DC

## 2017-04-08 NOTE — Telephone Encounter (Signed)
Exercise myoview ordered per MD Message to [facility] scheduling pool to contact patient to arrange test & f/up

## 2017-04-08 NOTE — Progress Notes (Signed)
Current Outpatient Prescriptions on File Prior to Visit  Medication Sig  . acidophilus (RISAQUAD) CAPS capsule Take 1 capsule by mouth daily.  Marland Kitchen albuterol (PROAIR HFA) 108 (90 Base) MCG/ACT inhaler Inhale 2 puffs into the lungs every 6 (six) hours as needed for wheezing or shortness of breath.  Marland Kitchen azithromycin (ZITHROMAX) 500 MG tablet Take 500 mg by mouth every Monday, Wednesday, and Friday.   . cholecalciferol (VITAMIN D) 1000 units tablet Take 2,000 Units by mouth daily.   . diphenhydrAMINE (BENADRYL) 25 MG tablet Take 25 mg by mouth every 6 (six) hours as needed for allergies.  Marland Kitchen ethambutol (MYAMBUTOL) 400 MG tablet Take 1,200 mg by mouth every Monday, Wednesday, and Friday.   . famotidine (PEPCID) 20 MG tablet Take 20 mg by mouth 2 (two) times daily as needed for heartburn or indigestion.  . fexofenadine (ALLEGRA) 180 MG tablet Take 180 mg by mouth daily as needed for allergies.  . Flaxseed, Linseed, (FLAXSEED OIL) 1000 MG CAPS Take 1,000 mg by mouth daily.   . Magnesium 500 MG TABS Take 500 mg by mouth at bedtime.  . mometasone (NASONEX) 50 MCG/ACT nasal spray Place 2 sprays into the nose at bedtime as needed (for rhinitis).  . rifampin (RIFADIN) 300 MG capsule Take 600 mg by mouth every Monday, Wednesday, and Friday.  . vitamin C (ASCORBIC ACID) 500 MG tablet Take 500 mg by mouth daily.   No current facility-administered medications on file prior to visit.     Chief Complaint  Patient presents with  . Follow-up    Pt c/o worsening SOB and chest tightness x 1 week, overall symptoms have been present for a few months. Pt seen in [facility] ED 04/07/17 for symptoms. Little mucus production at times - light yellow to clear    Pulmonary tests Bronchoscopy 05/29/08 >> scarring RML, cytology negative; MAI, Penicillium, and Aspergillus [place] in BAL Bronchoscopy 12/28/12 >> MAI in BAL, cytology negative PFT 03/01/13 >> FEV1 2.92 (136%), FEV1% 85, TLC 5.91 (124%), DLCO 117%, borderline BD Spirometry  02/15/17 >> FEV1 2.60 (102%), FEV1% 80 FeNO 04/08/17 >> 22  Chest imaging CT chest 02/08/08 >> RML tree in bud, mild BTX RML CT chest 12/22/12 >> RUL and RML tree in bud, BTX RML CT chest 04/23/15 >> BTX RUL/RML, nodular infiltrates RLL/RML increased in size CT chest 11/23/16 >> cylindrical BTX RUL/RML, scattered nodularity no change  Past medical history GERD, IBS, Hyponatremia  Past surgical history, Family history, Social history, Allergies reviewed  Vital signs BP 130/80 (BP Location: Left Arm, Cuff Size: Normal)   Pulse 78   Ht 5' 4.5" (1.638 m)   Wt 132 lb 12.8 oz (60.2 kg)   LMP 12/14/2004   SpO2 96%   BMI 22.44 kg/m    History of Present Illness: Kim Vasquez is a 66 y.o. female former smoker with chronic cough, localized BTX, and MAI.  She noticed more chest discomfort for the past 4 days.  She feels tight in her chest.  She has more cough and bringing up yellow sputum.  She has been getting nasal congestion also.    She was seen in ER yesterday.  CXR didn't show any change.  Labs reviewed and unremarkable.  She was seen by cardiology and has stress test set up.    Her chest discomfort is more on the Lt than the Rt.  She has tried using albuterol.  This helps for a little while, but she was expected it would help more.  She denies fever, hemoptysis, GI symptoms, or skin rash.  She was seen by Dr. Dorothyann Peng with ID at [id number] in March, and was told her MAI was under control.  Sputum from 02/05/17 was negative for AFB.   Physical Exam:  General - No distress Eyes - wears glasses, pupils reactive ENT - No sinus tenderness, no oral exudate, no LAN Cardiac - s1s2 regular, no murmur Chest - No wheeze/rales/dullness, good air entry, normal respiratory excursion Back - No focal tenderness Abd - Soft, non-tender Ext - No edema Neuro - Normal strength Skin - No rashes Psych - Normal mood, and behavior   CMP Latest Ref Rng & Units 04/07/2017 11/20/2016 09/25/2015  Glucose  65 - 99 mg/dL 104(H) 100(H) 76  BUN 6 - 20 mg/dL 9 9 7   Creatinine 0.44 - 1.00 mg/dL 0.62 0.73 0.65  Sodium 135 - 145 mmol/L 135 133(L) 135  Potassium 3.5 - 5.1 mmol/L 4.1 5.0 5.1  Chloride 101 - 111 mmol/L 100(L) 99 100  CO2 22 - 32 mmol/L 28 30 29   Calcium 8.9 - 10.3 mg/dL 9.3 9.2 9.2  Total Protein 6.1 - 8.1 g/dL - - 6.6  Total Bilirubin 0.2 - 1.2 mg/dL - - 0.9  Alkaline Phos 33 - 130 U/L - - 55  AST 10 - 35 U/L - - 15  ALT 6 - 29 U/L - - 9    CBC Latest Ref Rng & Units 04/07/2017 09/25/2015 04/24/2015  WBC 4.0 - 10.5 K/uL 5.4 4.0 6.1  Hemoglobin 12.0 - 15.0 g/dL 14.3 13.4 13.8  Hematocrit 36.0 - 46.0 % 40.3 38.2 39.8  Platelets 150 - 400 K/uL 216 229 266    Dg Chest 2 View  Result Date: 04/07/2017 CLINICAL DATA:  Chest pain off and on EXAM: CHEST  2 VIEW COMPARISON:  02/12/2017 FINDINGS: Stable nodular density in the right mid lung associated with areas of bronchiectasis on prior CT. No acute airspace opacities or effusions. Heart is normal size. No acute bony abnormality. IMPRESSION: Stable chronic changes of bronchiectasis in the right lung. No active disease. Electronically Signed   By: Rolm Baptise M.D.   On: 04/07/2017 12:23    Assessment/Plan:  Allergic asthma. - I am concerned her current symptoms are related to asthma - she is reluctant to use inhaled corticosteroids with hx of MAI - she had adverse reaction to montelukast (nightmares, anxiety) - will have her try accolate - she can use allegra during the day and benadryl at night - she can continue prn albuterol - if her symptoms persist, then might need to try ICS  Atypical chest pain. - she has stress test with cardiology in one week  MAI with bronchiectasis. - she is followed by Dr. Dorothyann Peng at [facility] for 3 drug therapy - might need to consider repeat CT chest if her chest symptoms persist and there isn't any other cause found   Patient Instructions  Accolate 20 mg pill twice daily Use mometasone 1 spray each  nostril daily Use allegra during the day Use benadryl 50 mg nightly Albuterol every 4 to 6 hours as needed for cough, wheeze, or chest congestion  Follow up in 4 weeks with Dr. Halford Chessman or Nurse Practitioner  Time spent 28 minutes  Chesley Mires, MD [place] [facility]/Sleep Pager:  [phone] 04/08/2017, 1:14 PM

## 2017-04-08 NOTE — Patient Instructions (Signed)
Accolate 20 mg pill twice daily Use mometasone 1 spray each nostril daily Use allegra during the day Use benadryl 50 mg nightly Albuterol every 4 to 6 hours as needed for cough, wheeze, or chest congestion  Follow up in 4 weeks with Dr. Halford Chessman or Nurse Practitioner

## 2017-04-08 NOTE — Telephone Encounter (Signed)
Left message for patient to call and schedule exercise myoview and f/u visit ordered by Dr. Debara Pickett.

## 2017-04-08 NOTE — Telephone Encounter (Signed)
-----   Message from Pixie Casino, MD sent at 04/07/2017  7:06 PM EDT ----- Regarding: Stress test and follow-up Ita Fritzsche-  Can you please arrange for an exercise myoview and follow-up with me from her ER visit today - indication is chest pressure.  Thanks.  Dr. Lemmie Evens

## 2017-04-09 ENCOUNTER — Encounter: Payer: Self-pay | Admitting: Pulmonary Disease

## 2017-04-09 NOTE — Telephone Encounter (Signed)
VS  Please Advise-  Please see pt's email

## 2017-04-12 ENCOUNTER — Encounter: Payer: Self-pay | Admitting: Pulmonary Disease

## 2017-04-12 NOTE — Telephone Encounter (Signed)
Dear Dr. Halford Chessman,    I had a sputum culture done on Mar 8 at [facility], maybe 2 weeks after the one your lab send out. Although results have not been posted, Dr. Dorothyann Peng messaged me on Mar 20 that the smear test   and culture were positive for rare AFB. She messaged me that she has requested antibiotic susceptibility testing on the culture, but I have not heard the outcome. I think this takes a couple months.    I have not taken mometazone since Thursday night (one dose only). I have not detected any skipped heartbeats since Friday night, when it occurred but much less often than Thursday night. Still have some chest tightness on the left side, but generally feel much better than last Monday - Wednesday, and seem to improve some  daily.    Kim Vasquez     VS please advise if anything further needs to be done.  thanks

## 2017-04-13 ENCOUNTER — Other Ambulatory Visit (HOSPITAL_COMMUNITY)
Admission: RE | Admit: 2017-04-13 | Discharge: 2017-04-13 | Disposition: A | Discharge status: Home / Self Care | Payer: BC Managed Care – PPO | Source: Ambulatory Visit | Attending: Obstetrics & Gynecology | Admitting: Obstetrics & Gynecology

## 2017-04-13 ENCOUNTER — Ambulatory Visit
Admission: RE | Admit: 2017-04-13 | Discharge: 2017-04-13 | Disposition: A | Discharge status: Home / Self Care | Payer: BC Managed Care – PPO | Source: Ambulatory Visit | Attending: Obstetrics & Gynecology | Admitting: Obstetrics & Gynecology

## 2017-04-13 ENCOUNTER — Encounter: Payer: Self-pay | Admitting: Obstetrics & Gynecology

## 2017-04-13 ENCOUNTER — Ambulatory Visit (INDEPENDENT_AMBULATORY_CARE_PROVIDER_SITE_OTHER): Payer: BC Managed Care – PPO | Admitting: Obstetrics & Gynecology

## 2017-04-13 VITALS — BP 112/72 | HR 80 | Resp 12 | Ht 64.0 in | Wt 132.4 lb

## 2017-04-13 DIAGNOSIS — Z01419 Encounter for gynecological examination (general) (routine) without abnormal findings: Secondary | ICD-10-CM | POA: Diagnosis not present

## 2017-04-13 DIAGNOSIS — Z1231 Encounter for screening mammogram for malignant neoplasm of breast: Secondary | ICD-10-CM

## 2017-04-13 DIAGNOSIS — Z124 Encounter for screening for malignant neoplasm of cervix: Secondary | ICD-10-CM | POA: Diagnosis not present

## 2017-04-13 NOTE — Progress Notes (Signed)
66 y.o. G2P2 MarriedCaucasianF here for annual exam.  Continues to have issues with Microbacterium.  Went to the ER last week due to exertional dyspnea.  Evaluation done for cardiac disease.  Has a stress test scheduled.  Also, saw Dr. Halford Chessman yesterday.  Now followed by Dr. Debara Pickett, cardiology.  Denies vaginal bleeding.  Patient's last menstrual period was 12/14/2004.          Sexually active: Yes.    The current method of family planning is vasectomy and post menopausal status.    Exercising: Yes.    Walking, Gardening Smoker:  no  Health Maintenance: Pap:  01/07/15 Neg. 08/31/12 Neg. HR HPV:Neg History of abnormal Pap:  no MMG:  04/13/17 neg  01/28/16 [place] Left BIRADS1:neg Colonoscopy:  2012 Dr. Wynetta Emery  BMD:   2010 TDaP:  05/2012 Pneumonia vaccine(s):  01/23/13 Zostavax:   Completed Hep C testing: Neg per patient Screening Labs: PCP   reports that she quit smoking about 35 years ago. Her smoking use included Cigarettes. She has a 14.00 pack-year smoking history. She has never used smokeless tobacco. She reports that she drinks about 4.2 - 8.4 oz of alcohol per week . She reports that she does not use drugs.  Past Medical History:  Diagnosis Date  . Asthma   . GERD (gastroesophageal reflux disease)   . History of anemia after childbirth  . IBS (irritable bowel syndrome)   . Mycobacterium avium-intracellulare complex ([place])   . Osteopenia   . Pulmonary infiltrate   . Seasonal allergies     Past Surgical History:  Procedure Laterality Date  . BREAST SURGERY  2/07   left breast  . CARPAL TUNNEL RELEASE  2007      . VIDEO BRONCHOSCOPY  12/28/2012   Procedure: VIDEO BRONCHOSCOPY WITHOUT FLUORO;  Surgeon: Chesley Mires, MD;  Location: [facility];  Service: Cardiopulmonary;  Laterality: Bilateral;    Current Outpatient Prescriptions  Medication Sig Dispense Refill  . acidophilus (RISAQUAD) CAPS capsule Take 1 capsule by mouth daily.    Marland Kitchen albuterol (PROAIR HFA) 108 (90 Base) MCG/ACT  inhaler Inhale 2 puffs into the lungs every 6 (six) hours as needed for wheezing or shortness of breath. 1 Inhaler 5  . azithromycin (ZITHROMAX) 500 MG tablet Take 500 mg by mouth every Monday, Wednesday, and Friday.     . cholecalciferol (VITAMIN D) 1000 units tablet Take 2,000 Units by mouth daily.     . diphenhydrAMINE (BENADRYL) 25 MG tablet Take 25 mg by mouth every 6 (six) hours as needed for allergies.    Marland Kitchen ethambutol (MYAMBUTOL) 400 MG tablet Take 1,200 mg by mouth every Monday, Wednesday, and Friday.   11  . famotidine (PEPCID) 20 MG tablet Take 20 mg by mouth 2 (two) times daily as needed for heartburn or indigestion.    . fexofenadine (ALLEGRA) 180 MG tablet Take 180 mg by mouth daily as needed for allergies.    . Flaxseed, Linseed, (FLAXSEED OIL) 1000 MG CAPS Take 1,000 mg by mouth daily.     . Magnesium 500 MG TABS Take 500 mg by mouth at bedtime.    . mometasone (NASONEX) 50 MCG/ACT nasal spray Place 2 sprays into the nose at bedtime as needed (for rhinitis).    . rifampin (RIFADIN) 300 MG capsule Take 600 mg by mouth every Monday, Wednesday, and Friday.    . vitamin C (ASCORBIC ACID) 500 MG tablet Take 500 mg by mouth daily.     No current facility-administered medications for this  visit.     Family History  Problem Relation Age of Onset  . Lymphoma Mother   . Breast cancer Sister   . Colon cancer Maternal Aunt   . Heart disease      maternal grandparents  . Diabetes Paternal Grandmother   . Osteoporosis Paternal Grandmother   . Hypertension Father   . Bipolar disorder Sister   . Bipolar disorder Maternal Grandmother     ROS:  Pertinent items are noted in HPI.  Otherwise, a comprehensive ROS was negative.  Exam:   BP 112/72 (BP Location: Right Arm, Patient Position: Sitting, Cuff Size: Normal)   Pulse 80   Resp 12   Ht 5\' 4"  (1.626 m)   Wt 132 lb 6.4 oz (60.1 kg)   LMP 12/14/2004   BMI 22.73 kg/m   Weight change: stable  Height: 5\' 4"  (162.6 cm)  Ht Readings  from Last 3 Encounters:  04/13/17 5\' 4"  (1.626 m)  04/08/17 5' 4.5" (1.638 m)  04/07/17 5' 4.5" (1.638 m)    General appearance: alert, cooperative and appears stated age Head: Normocephalic, without obvious abnormality, atraumatic Neck: no adenopathy, supple, symmetrical, trachea midline and thyroid normal to inspection and palpation Lungs: clear to auscultation bilaterally Breasts: normal appearance, no masses or tenderness Heart: regular rate and rhythm Abdomen: soft, non-tender; bowel sounds normal; no masses,  no organomegaly Extremities: extremities normal, atraumatic, no cyanosis or edema Skin: Skin color, texture, turgor normal. No rashes or lesions Lymph nodes: Cervical, supraclavicular, and axillary nodes normal. No abnormal inguinal nodes palpated Neurologic: Grossly normal   Pelvic: External genitalia:  no lesions              Urethra:  normal appearing urethra with no masses, tenderness or lesions              Bartholins and Skenes: normal                 Vagina: normal appearing vagina with normal color and discharge, no lesions              Cervix: no lesions              Pap taken: Yes.   Bimanual Exam:  Uterus:  normal size, contour, position, consistency, mobility, non-tender              Adnexa: normal adnexa and no mass, fullness, tenderness               Rectovaginal: Confirms               Anus:  normal sphincter tone, no lesions  Chaperone was present for exam.  A:  Well Woman with normal exam H/O mycobacterium avium.  Followed by ID at [facility] and pulmonology, Dr. Halford Chessman, locally PMP, no HRT  Family hx of breast cancer in her mother H/O IBS/GERD h/o stable thyroid nodue, last imaging 1/14 Osteopenia  P:   Mammogram guidelines reviewed pap smear obtained today Declines BMD Lab work with Dr. Inda Merlin return annually or prn

## 2017-04-14 ENCOUNTER — Telehealth (HOSPITAL_COMMUNITY): Payer: Self-pay

## 2017-04-14 NOTE — Telephone Encounter (Signed)
Encounter complete. 

## 2017-04-15 LAB — CYTOLOGY - PAP: Diagnosis: NEGATIVE

## 2017-04-16 ENCOUNTER — Ambulatory Visit (HOSPITAL_COMMUNITY)
Admission: RE | Admit: 2017-04-16 | Discharge: 2017-04-16 | Disposition: A | Discharge status: Home / Self Care | Payer: BC Managed Care – PPO | Source: Ambulatory Visit | Attending: Cardiovascular Disease | Admitting: Cardiovascular Disease

## 2017-04-16 ENCOUNTER — Ambulatory Visit: Payer: BC Managed Care – PPO | Admitting: Obstetrics & Gynecology

## 2017-04-16 DIAGNOSIS — R0789 Other chest pain: Secondary | ICD-10-CM

## 2017-04-16 LAB — MYOCARDIAL PERFUSION IMAGING
Estimated workload: 11.6 METS
Exercise duration (min): 10 min
Exercise duration (sec): 35 s
LV dias vol: 90 mL (ref 46–106)
LV sys vol: 36 mL
MPHR: 155 {beats}/min
Peak HR: 148 {beats}/min
Percent HR: 95 %
RPE: 17
Rest HR: 68 {beats}/min
SRS: 0
SSS: 0
TID: 0.99

## 2017-04-16 MED ORDER — TECHNETIUM TC 99M TETROFOSMIN IV KIT
31.0000 | PACK | Freq: Once | INTRAVENOUS | Status: AC | PRN
  Administered 2017-04-16: 31 via INTRAVENOUS
  Filled 2017-04-16: qty 31

## 2017-04-16 MED ORDER — TECHNETIUM TC 99M TETROFOSMIN IV KIT
10.3000 | PACK | Freq: Once | INTRAVENOUS | Status: AC | PRN
  Administered 2017-04-16: 10.3 via INTRAVENOUS
  Filled 2017-04-16: qty 11

## 2017-04-30 ENCOUNTER — Encounter: Payer: Self-pay | Admitting: Internal Medicine

## 2017-04-30 ENCOUNTER — Ambulatory Visit (INDEPENDENT_AMBULATORY_CARE_PROVIDER_SITE_OTHER): Payer: BC Managed Care – PPO | Admitting: Internal Medicine

## 2017-04-30 VITALS — BP 120/82 | HR 79 | Ht 64.0 in | Wt 132.4 lb

## 2017-04-30 DIAGNOSIS — R0789 Other chest pain: Secondary | ICD-10-CM | POA: Diagnosis not present

## 2017-04-30 DIAGNOSIS — J4520 Mild intermittent asthma, uncomplicated: Secondary | ICD-10-CM | POA: Diagnosis not present

## 2017-04-30 DIAGNOSIS — R002 Palpitations: Secondary | ICD-10-CM | POA: Diagnosis not present

## 2017-04-30 NOTE — Progress Notes (Signed)
OFFICE FOLLOW-UP NOTE  Chief Complaint:  Follow-up chest pain  Primary Care Physician: Darcus Austin, MD  HPI:  Kim Vasquez is a 66 y.o. female with a past medial history significant for chest pain. Recently saw her in the emergency department which time she was having chest pressure for several hours. She had trouble walking up steps due to shortness of breath. She has a history of MAI infection as well as asthma. She is followed by Dr. Halford Chessman. She ruled out for MI with negative troponins and had no acute EKG changes. She was mildly tachycardic and very anxious. I recommended outpatient stress testing. She underwent an outpatient nuclear stress test which was negative for ischemia and showed normal LV function. She reports recently she's been having some palpitations, namely feelings of skipped or missed beats. I suspect these are PVCs. She's also recently been using albuterol more regularly at the request of her pulmonologist. It's likely that this is exacerbating her PVCs. She does report shortness of breath has improved and she denies any recurrent chest pain.  PMHx:  Past Medical History:  Diagnosis Date  . Asthma   . GERD (gastroesophageal reflux disease)   . History of anemia after childbirth  . IBS (irritable bowel syndrome)   . Mycobacterium avium-intracellulare complex ([place])   . Osteopenia   . Pulmonary infiltrate   . Seasonal allergies     Past Surgical History:  Procedure Laterality Date  . BREAST SURGERY  2/07   left breast  . CARPAL TUNNEL RELEASE  2007      . VIDEO BRONCHOSCOPY  12/28/2012   Procedure: VIDEO BRONCHOSCOPY WITHOUT FLUORO;  Surgeon: Chesley Mires, MD;  Location: [facility];  Service: Cardiopulmonary;  Laterality: Bilateral;    FAMHx:  Family History  Problem Relation Age of Onset  . Lymphoma Mother   . Breast cancer Sister   . Colon cancer Maternal Aunt   . Heart disease Unknown        maternal grandparents  . Diabetes Paternal Grandmother     . Osteoporosis Paternal Grandmother   . Hypertension Father   . Bipolar disorder Sister   . Bipolar disorder Maternal Grandmother     SOCHx:   reports that she quit smoking about 35 years ago. Her smoking use included Cigarettes. She has a 14.00 pack-year smoking history. She has never used smokeless tobacco. She reports that she drinks about 4.2 - 8.4 oz of alcohol per week . She reports that she does not use drugs.  ALLERGIES:  Allergies  Allergen Reactions  . Accolate [Zafirlukast]     Heart started skipping beats  . Bacitracin Itching and Other (See Comments)    Reaction:  Numbness   . Doxycycline Other (See Comments)    Reaction:  Stiff neck and headache   . Erythromycin Other (See Comments)    Reaction:  Decreased urine output  . Etodolac Other (See Comments)    Reaction:  Dizziness   . Moxifloxacin Other (See Comments)    Reaction:  Stiff neck, dizziness, and headache  . Neosporin [Neomycin-Bacitracin Zn-Polymyx] Itching  . Polysporin [Bacitracin-Polymyxin B] Itching  . Penicillins Itching, Rash and Other (See Comments)    Has patient had a PCN reaction causing immediate rash, facial/tongue/throat swelling, SOB or lightheadedness with hypotension: No Has patient had a PCN reaction causing severe rash involving mucus membranes or skin necrosis: No Has patient had a PCN reaction that required hospitalization No Has patient had a PCN reaction occurring within the last  10 years: No If all of the above answers are "NO", then may proceed with Cephalosporin use.    ROS: Pertinent items noted in HPI and remainder of comprehensive ROS otherwise negative.  HOME MEDS: Current Outpatient Prescriptions on File Prior to Visit  Medication Sig Dispense Refill  . acidophilus (RISAQUAD) CAPS capsule Take 1 capsule by mouth daily.    Marland Kitchen albuterol (PROAIR HFA) 108 (90 Base) MCG/ACT inhaler Inhale 2 puffs into the lungs every 6 (six) hours as needed for wheezing or shortness of breath. 1  Inhaler 5  . cholecalciferol (VITAMIN D) 1000 units tablet Take 2,000 Units by mouth daily.     . diphenhydrAMINE (BENADRYL) 25 MG tablet Take 25 mg by mouth every 6 (six) hours as needed for allergies.    . famotidine (PEPCID) 20 MG tablet Take 20 mg by mouth 2 (two) times daily as needed for heartburn or indigestion.    . fexofenadine (ALLEGRA) 180 MG tablet Take 180 mg by mouth daily as needed for allergies.    . Flaxseed, Linseed, (FLAXSEED OIL) 1000 MG CAPS Take 1,000 mg by mouth daily.     . Magnesium 500 MG TABS Take 500 mg by mouth at bedtime.    . mometasone (NASONEX) 50 MCG/ACT nasal spray Place 2 sprays into the nose at bedtime as needed (for rhinitis).    . vitamin C (ASCORBIC ACID) 500 MG tablet Take 500 mg by mouth daily.     No current facility-administered medications on file prior to visit.     LABS/IMAGING: No results found for this or any previous visit (from the past 48 hour(s)). No results found.  LIPID PANEL:    Component Value Date/Time   CHOL 207 (H) 01/07/2015 1620   TRIG 121 01/07/2015 1620   HDL 66 01/07/2015 1620   CHOLHDL 3.1 01/07/2015 1620   VLDL 24 01/07/2015 1620   LDLCALC 117 (H) 01/07/2015 1620     WEIGHTS: Wt Readings from Last 3 Encounters:  04/30/17 132 lb 6.4 oz (60.1 kg)  04/16/17 132 lb (59.9 kg)  04/13/17 132 lb 6.4 oz (60.1 kg)    VITALS: BP 120/82   Pulse 79   Ht 5\' 4"  (1.626 m)   Wt 132 lb 6.4 oz (60.1 kg)   LMP 12/14/2004   BMI 22.73 kg/m   EXAM: General appearance: alert and no distress Neck: no carotid bruit and no JVD Lungs: clear to auscultation bilaterally Heart: regular rate and rhythm Abdomen: soft, non-tender; bowel sounds normal; no masses,  no organomegaly Extremities: extremities normal, atraumatic, no cyanosis or edema Pulses: 2+ and symmetric Skin: Skin color, texture, turgor normal. No rashes or lesions Neurologic: Grossly normal Psych: Pleasant  EKG: Deferred  ASSESSMENT: 1. Atypical chest  pain 2. Anxiety 3. Palpitations-likely PVCs 4. Low risk Myoview stress test 5. Asthma with history of MAI infection  PLAN: 1.   Mrs. her she had atypical chest pain, ruled out for MI in the hospital and had a low risk Myoview. She's had some palpitations which sound like PVCs but were originally attributed to Zafirlukast. She has since stopped taking this medicine. I suspect is more likely they're related to increased use of albuterol. I explained to her that her palpitations may worsen with the use of beta agonists, stimulants, anxiety, poor sleep or caffeine use. She endorsed all of the above. She'll continue to work on that and I suspect her palpitations will improve. If they did not him happy to place a monitor. I think  she is at low risk for cardiac events.  Follow-up with me as needed.  Pixie Casino, MD, [place]  Attending Cardiologist  Direct Dial: [phone]  Fax: [phone]  Website:  [email or web address] 04/30/2017, 1:31 PM

## 2017-04-30 NOTE — Patient Instructions (Signed)
Your physician recommends that you schedule a follow-up appointment as needed  

## 2017-05-04 ENCOUNTER — Ambulatory Visit: Payer: BC Managed Care – PPO | Admitting: Adult Health

## 2017-06-08 ENCOUNTER — Ambulatory Visit (INDEPENDENT_AMBULATORY_CARE_PROVIDER_SITE_OTHER): Payer: BC Managed Care – PPO | Admitting: Pulmonary Disease

## 2017-06-08 ENCOUNTER — Other Ambulatory Visit (INDEPENDENT_AMBULATORY_CARE_PROVIDER_SITE_OTHER): Payer: BC Managed Care – PPO

## 2017-06-08 ENCOUNTER — Encounter: Payer: Self-pay | Admitting: Pulmonary Disease

## 2017-06-08 VITALS — BP 108/66 | HR 82 | Ht 64.0 in | Wt 135.4 lb

## 2017-06-08 DIAGNOSIS — J45909 Unspecified asthma, uncomplicated: Secondary | ICD-10-CM

## 2017-06-08 DIAGNOSIS — A310 Pulmonary mycobacterial infection: Secondary | ICD-10-CM | POA: Diagnosis not present

## 2017-06-08 DIAGNOSIS — J479 Bronchiectasis, uncomplicated: Secondary | ICD-10-CM

## 2017-06-08 LAB — CBC WITH DIFFERENTIAL/PLATELET
Basophils Absolute: 0 10*3/uL (ref 0.0–0.1)
Basophils Relative: 0.4 % (ref 0.0–3.0)
Eosinophils Absolute: 0.2 10*3/uL (ref 0.0–0.7)
Eosinophils Relative: 2.6 % (ref 0.0–5.0)
HCT: 39.8 % (ref 36.0–46.0)
Hemoglobin: 13.9 g/dL (ref 12.0–15.0)
Lymphocytes Relative: 16.1 % (ref 12.0–46.0)
Lymphs Abs: 1 10*3/uL (ref 0.7–4.0)
MCHC: 34.9 g/dL (ref 30.0–36.0)
MCV: 92.3 fl (ref 78.0–100.0)
Monocytes Absolute: 0.6 10*3/uL (ref 0.1–1.0)
Monocytes Relative: 10.3 % (ref 3.0–12.0)
Neutro Abs: 4.2 10*3/uL (ref 1.4–7.7)
Neutrophils Relative %: 70.6 % (ref 43.0–77.0)
Platelets: 236 10*3/uL (ref 150.0–400.0)
RBC: 4.31 Mil/uL (ref 3.87–5.11)
RDW: 12.1 % (ref 11.5–15.5)
WBC: 6 10*3/uL (ref 4.0–10.5)

## 2017-06-08 MED ORDER — ALBUTEROL SULFATE (2.5 MG/3ML) 0.083% IN NEBU: 2.5000 mg | INHALATION_SOLUTION | Freq: Four times a day (QID) | RESPIRATORY_TRACT | 5 refills | Status: DC | PRN

## 2017-06-08 NOTE — Progress Notes (Signed)
Current Outpatient Prescriptions on File Prior to Visit  Medication Sig  . acidophilus (RISAQUAD) CAPS capsule Take 1 capsule by mouth daily.  Marland Kitchen albuterol (PROAIR HFA) 108 (90 Base) MCG/ACT inhaler Inhale 2 puffs into the lungs every 6 (six) hours as needed for wheezing or shortness of breath.  . cholecalciferol (VITAMIN D) 1000 units tablet Take 2,000 Units by mouth daily.   . diphenhydrAMINE (BENADRYL) 25 MG tablet Take 25 mg by mouth every 6 (six) hours as needed for allergies.  . famotidine (PEPCID) 20 MG tablet Take 20 mg by mouth 2 (two) times daily as needed for heartburn or indigestion.  . fexofenadine (ALLEGRA) 180 MG tablet Take 180 mg by mouth daily as needed for allergies.  . Flaxseed, Linseed, (FLAXSEED OIL) 1000 MG CAPS Take 1,000 mg by mouth daily.   . Magnesium 500 MG TABS Take 500 mg by mouth at bedtime.  . mometasone (NASONEX) 50 MCG/ACT nasal spray Place 2 sprays into the nose at bedtime as needed (for rhinitis).  . sodium chloride HYPERTONIC 3 % nebulizer solution Inhale into the lungs.  . vitamin C (ASCORBIC ACID) 500 MG tablet Take 500 mg by mouth daily.   No current facility-administered medications on file prior to visit.     Chief Complaint  Patient presents with  . Follow-up    pt is here for 6 mo follow up on Cough. Pt says coughing daily managable, coughs up yellow sometimes. Pt has tighest in chest-worsening, SOB, using rescue inhaler twice daily.     Pulmonary tests Bronchoscopy 05/29/08 >> scarring RML, cytology negative; MAI, Penicillium, and Aspergillus [place] in BAL Bronchoscopy 12/28/12 >> MAI in BAL, cytology negative PFT 03/01/13 >> FEV1 2.92 (136%), FEV1% 85, TLC 5.91 (124%), DLCO 117%, borderline BD Spirometry 02/15/17 >> FEV1 2.60 (102%), FEV1% 80 FeNO 04/08/17 >> 22  Chest imaging CT chest 02/08/08 >> RML tree in bud, mild BTX RML CT chest 12/22/12 >> RUL and RML tree in bud, BTX RML CT chest 04/23/15 >> BTX RUL/RML, nodular infiltrates RLL/RML  increased in size CT chest 11/23/16 >> cylindrical BTX RUL/RML, scattered nodularity no change  Past medical history GERD, IBS, Hyponatremia  Past surgical history, Family history, Social history, Allergies reviewed  Vital signs BP 108/66 (BP Location: Right Arm, Cuff Size: Normal)   Pulse 82   Ht 5\' 4"  (1.626 m)   Wt 135 lb 6.4 oz (61.4 kg)   LMP 12/14/2004   SpO2 96%   BMI 23.24 kg/m    History of Present Illness: Kim Vasquez is a 66 y.o. female former smoker with chronic cough, localized BTX, and MAI.  She saw Dr. Dorothyann Peng at [facility] earlier this month.  Was told she is resistant to zithromax and only options is nebulized amikacin.  She is defer tx for now and had repeat sputum culture.  She is nervous about potential for disease progression.  She has cough with clear sputum.  Nebulized hypertonic saline helps with expectoration.  She uses albuterol twice per day, and this usually works.    Physical Exam:  General - pleasant Eyes - pupils reactive ENT - no sinus tenderness, no oral exudate, no LAN Cardiac - regular, no murmur Chest - no wheeze, rales Abd - soft, non tender Ext - no edema Skin - no rashes Neuro - normal strength Psych - normal mood   Assessment/Plan:  Allergic asthma. - I am still concerned she has an asthma component to her symptoms - can't use inhaled steroids in setting  of MAI, and no improvement with montelukast - will check RAST, IGE, and CBC with diff >> will then decide if she might be candidate for newer biologic agent - prn albuterol   MAI with bronchiectasis. - she has f/u with Dr. Dorothyann Peng in 2 months - continue nebulized hypertonic saline - defer CT chest for now   Patient Instructions  Lab tests today  Will arrange for albuterol for home nebulizer  Follow up in 4 months   Time spent 27 minutes  Chesley Mires, MD [facility] Pager:  [phone] 06/08/2017, 11:17 AM

## 2017-06-08 NOTE — Patient Instructions (Signed)
Lab tests today  Will arrange for albuterol for home nebulizer  Follow up in 4 months

## 2017-06-09 LAB — RESPIRATORY ALLERGY PROFILE REGION II ~~LOC~~
Allergen, A. alternata, m6: <0.1 kU/L
Allergen, C. Herbarum, M2: <0.1 kU/L
Allergen, Cedar tree, t12: <0.1 kU/L
Allergen, Comm Silver Birch, t9: <0.1 kU/L
Allergen, Cottonwood, t14: <0.1 kU/L
Allergen, D pternoyssinus,d7: <0.1 kU/L
Allergen, Mouse Urine Protein, e78: <0.1 kU/L
Allergen, Mulberry, t76: <0.1 kU/L
Allergen, Oak,t7: <0.1 kU/L
Allergen, P. notatum, m1: <0.1 kU/L
Aspergillus fumigatus, m3: <0.1 kU/L
Bermuda Grass: <0.1 kU/L
Box Elder IgE: <0.1 kU/L
Cat Dander: <0.1 kU/L
Cockroach: <0.1 kU/L
Common Ragweed: <0.1 kU/L
D. farinae: <0.1 kU/L
Dog Dander: <0.1 kU/L
Elm IgE: <0.1 kU/L
IgE (Immunoglobulin E), Serum: 49 kU/L (ref <115)
Johnson Grass: <0.1 kU/L
Pecan/Hickory Tree IgE: <0.1 kU/L
Rough Pigweed  IgE: <0.1 kU/L
Sheep Sorrel IgE: <0.1 kU/L
Timothy Grass: <0.1 kU/L

## 2017-06-24 ENCOUNTER — Telehealth: Payer: Self-pay | Admitting: Pulmonary Disease

## 2017-06-24 NOTE — Telephone Encounter (Signed)
CBC    Component Value Date/Time   WBC 6.0 06/08/2017 1149   RBC 4.31 06/08/2017 1149   HGB 13.9 06/08/2017 1149   HGB 12.7 01/07/2015 1533   HCT 39.8 06/08/2017 1149   PLT 236.0 06/08/2017 1149   MCV 92.3 06/08/2017 1149   MCH 32.1 04/07/2017 1201   MCHC 34.9 06/08/2017 1149   RDW 12.1 06/08/2017 1149   LYMPHSABS 1.0 06/08/2017 1149   MONOABS 0.6 06/08/2017 1149   EOSABS 0.2 06/08/2017 1149   BASOSABS 0.0 06/08/2017 1149    RAST 06/08/17 >> negative, IgE 49   Please inform pt that lab test did not show significant allergy reactions.  Based on these results I don't think she would be a good candidate for newer biologic agent to treat asthma.

## 2017-06-25 ENCOUNTER — Ambulatory Visit: Payer: BC Managed Care – PPO | Admitting: Obstetrics & Gynecology

## 2017-06-29 NOTE — Telephone Encounter (Signed)
ATC; phone did not ring despite multiple attempts. Will try again at later time.

## 2017-07-02 NOTE — Telephone Encounter (Signed)
Attempted to contact pt. Call would not go through. Will try back. 

## 2017-07-05 NOTE — Telephone Encounter (Signed)
Results have been explained to patient and recs per VS,  pt expressed understanding. Nothing further needed.

## 2017-07-20 ENCOUNTER — Other Ambulatory Visit: Payer: Self-pay | Admitting: Pulmonary Disease

## 2017-09-09 ENCOUNTER — Ambulatory Visit (INDEPENDENT_AMBULATORY_CARE_PROVIDER_SITE_OTHER): Payer: BC Managed Care – PPO | Admitting: Certified Nurse Midwife

## 2017-09-09 ENCOUNTER — Encounter: Payer: Self-pay | Admitting: Certified Nurse Midwife

## 2017-09-09 VITALS — BP 110/60 | HR 76 | Resp 16 | Wt 135.0 lb

## 2017-09-09 DIAGNOSIS — L298 Other pruritus: Secondary | ICD-10-CM

## 2017-09-09 DIAGNOSIS — N898 Other specified noninflammatory disorders of vagina: Secondary | ICD-10-CM

## 2017-09-09 DIAGNOSIS — N952 Postmenopausal atrophic vaginitis: Secondary | ICD-10-CM

## 2017-09-09 NOTE — Progress Notes (Signed)
66 y.o. Married Caucasian female G2P2 here with complaint of vaginal symptoms of itching and burning. Describes discharge as none. Onset of symptoms 2 weeks ago. Denies new personal products or vaginal dryness.No STD concerns. Urinary symptoms, Night urination only with burning when urine touches skin.. Contraception is Postmenopausal. Previous history of yeast infection several years ago, but no problems since. Has vaginal dryness and does not treat. Denies vaginal bleeding or pelvic pain. No other health issues today.  ROS Pertinent to HPI  O:Healthy female WDWN Affect: normal, orientation x 3  Exam:Skin: warm and dry Abdomen: soft, non tender  Inguinal Lymph nodes: no enlargement or tenderness Pelvic exam: External genital: normal female with atrophic appearance BUS: negative Vagina: scant discharge or moisture noted. Pale in appearance.  Affirm taken Cervix: normal, non tender, no CMT Uterus: normal, non tender Adnexa:normal, non tender, no masses or fullness noted   A:Normal pelvic exam Atrophic vaginitis R/O vaginal infection   P:Discussed findings of atrophic vaginitis and etiology. Discussed Aveeno or baking soda sitz bath for comfort. Avoid moist clothes or pads for extended period of time. If working out in gym clothes.   Coconut Oil use for skin protection prior to activity can be used to external skin for protection or dryness. Discussed coconut oil for vaginal dryness and instructions given. Questions addressed regarding vaginal dryness. Lab Affirm will treat if indicated   Rv prn

## 2017-09-09 NOTE — Patient Instructions (Signed)
Atrophic Vaginitis Atrophic vaginitis is when the tissues that line the vagina become dry and thin. This is caused by a drop in estrogen. Estrogen helps:  To keep the vagina moist.  To make a clear fluid that helps: ? To lubricate the vagina for sex. ? To protect the vagina from infection.  If the lining of the vagina is dry and thin, it may:  Make sex painful. It may also cause bleeding.  Cause a feeling of: ? Burning. ? Irritation. ? Itchiness.  Make an exam of your vagina painful. It may also cause bleeding.  Make you lose interest in sex.  Cause a burning feeling when you pee.  Make your vaginal fluid (discharge) brown or yellow.  For some women, there are no symptoms. This condition is most common in women who do not get their regular menstrual periods anymore (menopause). This often starts when a woman is 45-55 years old. Follow these instructions at home:  Take medicines only as told by your doctor. Do not use any herbal or alternative medicines unless your doctor says it is okay.  Use over-the-counter products for dryness only as told by your doctor. These include: ? Creams. ? Lubricants. ? Moisturizers.  Do not douche.  Do not use products that can make your vagina dry. These include: ? Scented feminine sprays. ? Scented tampons. ? Scented soaps.  If it hurts to have sex, tell your sexual partner. Contact a doctor if:  Your discharge looks different than normal.  Your vagina has an unusual smell.  You have new symptoms.  Your symptoms do not get better with treatment.  Your symptoms get worse. This information is not intended to replace advice given to you by your health care provider. Make sure you discuss any questions you have with your health care provider. Document Released: 05/18/2008 Document Revised: 05/07/2016 Document Reviewed: 11/21/2014 Elsevier Interactive Patient Education  2018 Elsevier Inc.  

## 2017-09-10 LAB — VAGINITIS/VAGINOSIS, DNA PROBE
Candida Species: NEGATIVE
Gardnerella vaginalis: NEGATIVE
Trichomonas vaginosis: NEGATIVE

## 2017-09-16 ENCOUNTER — Telehealth: Payer: Self-pay | Admitting: Obstetrics & Gynecology

## 2017-09-16 ENCOUNTER — Ambulatory Visit (INDEPENDENT_AMBULATORY_CARE_PROVIDER_SITE_OTHER): Payer: BC Managed Care – PPO | Admitting: Obstetrics & Gynecology

## 2017-09-16 ENCOUNTER — Encounter: Payer: Self-pay | Admitting: Obstetrics & Gynecology

## 2017-09-16 VITALS — BP 106/58 | HR 86 | Resp 14 | Wt 137.0 lb

## 2017-09-16 DIAGNOSIS — R309 Painful micturition, unspecified: Secondary | ICD-10-CM | POA: Diagnosis not present

## 2017-09-16 LAB — POCT URINALYSIS DIPSTICK
Bilirubin, UA: NEGATIVE
Glucose, UA: NEGATIVE
Ketones, UA: NEGATIVE
Leukocytes, UA: NEGATIVE
Nitrite, UA: NEGATIVE
Protein, UA: NEGATIVE
Urobilinogen, UA: 0.2 E.U./dL
pH, UA: 5 (ref 5.0–8.0)

## 2017-09-16 MED ORDER — SULFAMETHOXAZOLE-TRIMETHOPRIM 800-160 MG PO TABS: 1.0000 | ORAL_TABLET | Freq: Two times a day (BID) | ORAL | 0 refills | Status: DC

## 2017-09-16 NOTE — Telephone Encounter (Signed)
Spoke with patient. Patient states that she was seen last week with Kim Vasquez CNM for urinary symptoms. Was advised to use coconut oil and aveeno sitz bath for comfort. Patient states she is having increased urinary frequency and discomfort with urination. Denies any lower back pain, fever, or chills. Is having nausea without vomiting. Advised will need to be seen for further evaluation. Appointment scheduled for 09/16/2017 at 10:15 am with Dr.Miller. Patient is agreeable to date and time.  Routing to provider for final review. Patient agreeable to disposition. Will close encounter.

## 2017-09-16 NOTE — Progress Notes (Signed)
GYNECOLOGY  VISIT  CC:   Increased urinary frequency  HPI: 66 y.o. G2P2 Married Caucasian female here for complaint of increased urinary frequency and burning with urination that has been present for about two weeks.  Symptoms have worsened over the last week.  She was also having some topical itching last week when she was here and seen by French Ana.  Affirm testing was obtained.  She's been using topical baking soda and coconut oil and thinks the skin feels better.  She denies fever or back pain.  She is having some nausea this morning.  Denies vaginal bleeding or visible blood in urine.  States she did have urine testing last week.  No results in EPIC.  GYNECOLOGIC HISTORY: Patient's last menstrual period was 12/14/2004. Contraception: vasectomy Menopausal hormone therapy: none  Patient Active Problem List   Diagnosis Date Noted  . Atypical chest pain 04/30/2017  . Chest pressure 04/07/2017  . Palpitations 04/07/2017  . Headache 09/06/2015  . Hyponatremia 08/14/2015  . Hypo-osmolality and hyponatremia 08/14/2015  . Lichen simplex chronicus 06/29/2015  . Shortness of breath on exertion 04/23/2015  . Dyspnea 04/23/2015  . Upper airway cough syndrome 11/30/2013  . Intermittent asthma without complication 56/38/7564  . Chronic cough 01/23/2013  . Localized Bronchiectasis 01/23/2013  . MAI (mycobacterium avium-intracellulare) ([place]) 12/19/2012    Past Medical History:  Diagnosis Date  . Asthma   . GERD (gastroesophageal reflux disease)   . History of anemia after childbirth  . IBS (irritable bowel syndrome)   . Mycobacterium avium-intracellulare complex ([place])   . Osteopenia   . Pulmonary infiltrate   . Seasonal allergies     Past Surgical History:  Procedure Laterality Date  . BREAST SURGERY  2/07   left breast  . CARPAL TUNNEL RELEASE  2007      . VIDEO BRONCHOSCOPY  12/28/2012   Procedure: VIDEO BRONCHOSCOPY WITHOUT FLUORO;  Surgeon: Chesley Mires, MD;  Location: [facility]  ENDOSCOPY;  Service: Cardiopulmonary;  Laterality: Bilateral;    MEDS:   Current Outpatient Prescriptions on File Prior to Visit  Medication Sig Dispense Refill  . acidophilus (RISAQUAD) CAPS capsule Take 1 capsule by mouth daily.    Marland Kitchen albuterol (PROVENTIL) (2.5 MG/3ML) 0.083% nebulizer solution Take 3 mLs (2.5 mg total) by nebulization every 6 (six) hours as needed for wheezing or shortness of breath. 120 vial 5  . cholecalciferol (VITAMIN D) 1000 units tablet Take 2,000 Units by mouth daily.     . diphenhydrAMINE (BENADRYL) 25 MG tablet Take 25 mg by mouth every 6 (six) hours as needed for allergies.    . famotidine (PEPCID) 20 MG tablet Take 20 mg by mouth 2 (two) times daily as needed for heartburn or indigestion.    . fexofenadine (ALLEGRA) 180 MG tablet Take 180 mg by mouth daily as needed for allergies.    . Flaxseed, Linseed, (FLAXSEED OIL) 1000 MG CAPS Take 1,000 mg by mouth daily.     . Magnesium 500 MG TABS Take 500 mg by mouth at bedtime.    . mometasone (NASONEX) 50 MCG/ACT nasal spray Place 2 sprays into the nose at bedtime as needed (for rhinitis).    Marland Kitchen PROAIR HFA 108 (90 Base) MCG/ACT inhaler INHALE 2 PUFFS INTO THE LUNGS EVERY 6 (SIX) HOURS AS NEEDED FOR WHEEZING OR SHORTNESS OF BREATH. 8.5 Inhaler 3  . sodium chloride HYPERTONIC 3 % nebulizer solution Inhale into the lungs.    . vitamin C (ASCORBIC ACID) 500 MG tablet Take 500 mg  by mouth daily.     No current facility-administered medications on file prior to visit.     ALLERGIES: Accolate [zafirlukast]; Bacitracin; Doxycycline; Erythromycin; Etodolac; Moxifloxacin; Neosporin [neomycin-bacitracin zn-polymyx]; Polysporin [bacitracin-polymyxin b]; and Penicillins  Family History  Problem Relation Age of Onset  . Lymphoma Mother   . Breast cancer Sister   . Colon cancer Maternal Aunt   . Heart disease Unknown        maternal grandparents  . Diabetes Paternal Grandmother   . Osteoporosis Paternal Grandmother   .  Hypertension Father   . Bipolar disorder Sister   . Bipolar disorder Maternal Grandmother     SH:  Married, non smoker  Review of Systems  Constitutional: Negative for chills, fever and weight loss.  Respiratory: Negative.   Cardiovascular: Negative.   Gastrointestinal: Positive for abdominal pain (suprapubic).  Genitourinary: Positive for dysuria, frequency and urgency. Negative for flank pain and hematuria.    PHYSICAL EXAMINATION:    BP (!) 106/58 (BP Location: Right Arm, Patient Position: Sitting, Cuff Size: Normal)   Pulse 86   Resp 14   Wt 137 lb (62.1 kg)   LMP 12/14/2004   BMI 23.52 kg/m     General appearance: alert, cooperative and appears stated age CV:  Regular rate and rhythm Lungs:  clear to auscultation, no wheezes, rales or rhonchi, symmetric air entry Abdomen: +suprapubic tenderness, soft, bowel sounds normal; no masses,  no organomegaly Flank:  No CVA tenderness  Pelvic: External genitalia:  no lesions              Urethra:  normal appearing urethra with no masses, tenderness or lesions              Bartholins and Skenes: normal                 Vagina: normal appearing vagina with normal color and discharge, no lesions              Cervix: no lesions              Bimanual Exam:  Uterus:  normal size, contour, position, consistency, mobility, non-tender              Adnexa: no mass, fullness, tenderness              Anus:  no lesions  Chaperone was present for exam.  Assessment: Increased urinary frequency and dysuria c/w cystitis Microscopic hematuria  Plan: Urine culture and micro pending.  Results will be communicated with pt. Will start bactrim DS bid x 3 days.  Rx sent to pharmacy on file.

## 2017-09-16 NOTE — Telephone Encounter (Signed)
Patient called and said she is still having urinary tract infection symptoms. She'd like to see Dr. Sabra Heck today, if possible.  Last seen: 09/09/17

## 2017-09-17 LAB — URINALYSIS, MICROSCOPIC ONLY
Bacteria, UA: NONE SEEN
Casts: NONE SEEN /lpf
WBC, UA: NONE SEEN /hpf (ref 0–?)

## 2017-09-17 LAB — URINE CULTURE: Organism ID, Bacteria: NO GROWTH

## 2017-09-21 ENCOUNTER — Telehealth: Payer: Self-pay | Admitting: Obstetrics & Gynecology

## 2017-09-21 MED ORDER — ESTRADIOL 0.1 MG/GM VA CREA: TOPICAL_CREAM | VAGINAL | 0 refills | Status: DC

## 2017-09-21 NOTE — Telephone Encounter (Signed)
Spoke with patient. States she completed Bactrim DS on 10/6 and UTI symptoms have not improved. Reports urinary frequency and burning. Reports burning on labia, not when urinating. Denies fever, N/V, lower back pain or blood in urine.   Patient request results from urine culture dated 09/16/17, advised negative.   Advised patient will review with Dr. Sabra Heck and return call with recommendations. Advised Dr. Sabra Heck is in surgery this morning, response may not be immediate. Patient verbalizes understanding and is agreeable.   Dr. Sabra Heck -please review and advise?

## 2017-09-21 NOTE — Telephone Encounter (Signed)
Patient was seen last week for a uti. She has finished the medication and is still having symptoms.

## 2017-09-21 NOTE — Telephone Encounter (Signed)
Spoke with patient, advised as seen below per Dr. Sabra Heck. Patient request to try estrace vaginal cream externally, RX sent to verified pharmacy on file. Scheduled for f/u visit on 10/19 at 11:30am with Dr. Sabra Heck. Patient verbalizes understanding and is agreeable.   Routing to provider for final review. Patient is agreeable to disposition. Will close encounter.

## 2017-09-21 NOTE — Telephone Encounter (Signed)
Please let her know her urine culture was negative.  She's also had testing for yeast and BV in the last few weeks.  So, menopausal skin changes are the next most likely cause of these.  The thinner skin that is present can cause symptoms like a urethritis or cystitis.    Needs to treat with either topical estrogen--Estrace vaginal cream externally around urethra daily for next 10 days or with some vaginal steroid suppository to help with the irritation.  Ok to call in either--estrace cream or hydrocortisone suppository 25mg  vaginally nightly for 7 nights.  Will need recheck in about 10 days.

## 2017-10-01 ENCOUNTER — Ambulatory Visit (INDEPENDENT_AMBULATORY_CARE_PROVIDER_SITE_OTHER): Payer: BC Managed Care – PPO | Admitting: Pulmonary Disease

## 2017-10-01 ENCOUNTER — Encounter: Payer: Self-pay | Admitting: Pulmonary Disease

## 2017-10-01 ENCOUNTER — Ambulatory Visit (INDEPENDENT_AMBULATORY_CARE_PROVIDER_SITE_OTHER): Payer: BC Managed Care – PPO | Admitting: Obstetrics & Gynecology

## 2017-10-01 VITALS — BP 106/70 | HR 80 | Resp 14 | Ht 64.0 in | Wt 135.0 lb

## 2017-10-01 VITALS — BP 124/74 | HR 75 | Ht 64.0 in | Wt 135.6 lb

## 2017-10-01 DIAGNOSIS — J479 Bronchiectasis, uncomplicated: Secondary | ICD-10-CM | POA: Diagnosis not present

## 2017-10-01 DIAGNOSIS — R3915 Urgency of urination: Secondary | ICD-10-CM

## 2017-10-01 DIAGNOSIS — A310 Pulmonary mycobacterial infection: Secondary | ICD-10-CM | POA: Diagnosis not present

## 2017-10-01 DIAGNOSIS — J45909 Unspecified asthma, uncomplicated: Secondary | ICD-10-CM

## 2017-10-01 MED ORDER — MIRABEGRON ER 25 MG PO TB24: 25.0000 mg | ORAL_TABLET | Freq: Every day | ORAL | 1 refills | Status: DC

## 2017-10-01 NOTE — Patient Instructions (Signed)
Follow up in 6 months 

## 2017-10-01 NOTE — Progress Notes (Signed)
GYNECOLOGY  VISIT  CC:   Urinary frequecy  HPI: 66 y.o. G2P2 Married Caucasian female here for follow up of complaint of urinary frequency.  Pt has been both treated and tested for UTI and culture was negative.  Pt was started on vaginal estrogen and feels her symptoms are moderately better.  Just feels like she has to urinate "all the time".  Does not describe dysuria at this time.  Denies fever, back pain, or pelvic pain.  Does not feel she has vulvar itching or irritation.  Is getting up about 4 times nightly.  Feels the urge to urinate.  Gets up and voids.  Feels like she very often just "dribbles" and then goes back to sleep.  Symptoms have changed since she was initially seen for this as she not longer has dysuria and does not feel there is any skin issue contributing as well.  Possible causes of her symptoms reviewed.  At this time, OAB seems most likely diagnosis.  Treatment options reviewed as well as side effects.  Pt would like to try and symptoms are very bothersome for her currently.   Reports she is under some stress at this time as she will be retiring very soon.  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  Lots of work still to do.  GYNECOLOGIC HISTORY: Patient's last menstrual period was 12/14/2004. Contraception: post menopausal  Menopausal hormone therapy: estrace vag cream  Patient Active Problem List   Diagnosis Date Noted  . Atypical chest pain 04/30/2017  . Chest pressure 04/07/2017  . Palpitations 04/07/2017  . Headache 09/06/2015  . Hyponatremia 08/14/2015  . Hypo-osmolality and hyponatremia 08/14/2015  . Lichen simplex chronicus 06/29/2015  . Shortness of breath on exertion 04/23/2015  . Dyspnea 04/23/2015  . Upper airway cough syndrome 11/30/2013  . Intermittent asthma without complication 01/75/1025  . Chronic cough 01/23/2013  . Localized Bronchiectasis 01/23/2013  . MAI (mycobacterium  avium-intracellulare) ([place]) 12/19/2012    Past Medical History:  Diagnosis Date  . Asthma   . GERD (gastroesophageal reflux disease)   . History of anemia after childbirth  . IBS (irritable bowel syndrome)   . Mycobacterium avium-intracellulare complex ([place])   . Osteopenia   . Pulmonary infiltrate   . Seasonal allergies     Past Surgical History:  Procedure Laterality Date  . BREAST SURGERY  2/07   left breast  . CARPAL TUNNEL RELEASE  2007      . VIDEO BRONCHOSCOPY  12/28/2012   Procedure: VIDEO BRONCHOSCOPY WITHOUT FLUORO;  Surgeon: Chesley Mires, MD;  Location: [facility] ENDOSCOPY;  Service: Cardiopulmonary;  Laterality: Bilateral;    MEDS:   Current Outpatient Prescriptions on File Prior to Visit  Medication Sig Dispense Refill  . acidophilus (RISAQUAD) CAPS capsule Take 1 capsule by mouth daily.    Marland Kitchen albuterol (PROVENTIL) (2.5 MG/3ML) 0.083% nebulizer solution Take 3 mLs (2.5 mg total) by nebulization every 6 (six) hours as needed for wheezing or shortness of breath. 120 vial 5  . cholecalciferol (VITAMIN D) 1000 units tablet Take 2,000 Units by mouth daily.     . diphenhydrAMINE (BENADRYL) 25 MG tablet Take 25 mg by mouth every 6 (six) hours as needed for allergies.    Marland Kitchen estradiol (ESTRACE VAGINAL) 0.1 MG/GM vaginal cream Apply pea size amount externally around urethra daily for 10 days 42.5 g 0  . famotidine (PEPCID) 20 MG tablet Take 20 mg by mouth  2 (two) times daily as needed for heartburn or indigestion.    . fexofenadine (ALLEGRA) 180 MG tablet Take 180 mg by mouth daily as needed for allergies.    . Flaxseed, Linseed, (FLAXSEED OIL) 1000 MG CAPS Take 1,000 mg by mouth daily.     . Magnesium 500 MG TABS Take 500 mg by mouth at bedtime.    Marland Kitchen PROAIR HFA 108 (90 Base) MCG/ACT inhaler INHALE 2 PUFFS INTO THE LUNGS EVERY 6 (SIX) HOURS AS NEEDED FOR WHEEZING OR SHORTNESS OF BREATH. 8.5 Inhaler 3  . sodium chloride HYPERTONIC 3 % nebulizer solution Inhale into the lungs.    .  vitamin C (ASCORBIC ACID) 500 MG tablet Take 500 mg by mouth daily.     No current facility-administered medications on file prior to visit.     ALLERGIES: Accolate [zafirlukast]; Bacitracin; Doxycycline; Erythromycin; Etodolac; Moxifloxacin; Neosporin [neomycin-bacitracin zn-polymyx]; Polysporin [bacitracin-polymyxin b]; and Penicillins  Family History  Problem Relation Age of Onset  . Lymphoma Mother   . Breast cancer Sister   . Colon cancer Maternal Aunt   . Heart disease Unknown        maternal grandparents  . Diabetes Paternal Grandmother   . Osteoporosis Paternal Grandmother   . Hypertension Father   . Bipolar disorder Sister   . Bipolar disorder Maternal Grandmother     SH:  Married, non smoker  Review of Systems  Gastrointestinal: Positive for diarrhea.  Genitourinary: Positive for dysuria and frequency.  All other systems reviewed and are negative.   PHYSICAL EXAMINATION:    BP 106/70 (BP Location: Right Arm, Patient Position: Sitting, Cuff Size: Normal)   Pulse 80   Resp 14   Ht 5\' 4"  (1.626 m)   Wt 135 lb (61.2 kg)   LMP 12/14/2004   BMI 23.17 kg/m     General appearance: alert, cooperative and appears stated age Abdomen: soft, non-tender; bowel sounds normal; no masses,  no organomegaly  Pelvic: External genitalia:  no lesions              Urethra:  normal appearing urethra with no masses, tenderness or lesions              Bartholins and Skenes: normal                 Vagina: normal appearing vagina with normal color and discharge, no lesions              Cervix: no lesions              Bimanual Exam:  Uterus:  normal size, contour, position, consistency, mobility, non-tender              Adnexa: no mass, fullness, tenderness              Anus:   no lesions  Chaperone was present for exam.  Assessment: Urinary urgency, possible OAB Work stressors  Plan: Will try Myrbetriq 25mg  daily.  Risks and side effects reviewed.  Pt will check her BP at  least weekly after starting and knows to call with any change in BP.  Also, pt will give update in two weeks.    ~15 minutes spent with patient >50% of time was in face to face discussion of above.

## 2017-10-01 NOTE — Progress Notes (Signed)
Current Outpatient Prescriptions on File Prior to Visit  Medication Sig  . acidophilus (RISAQUAD) CAPS capsule Take 1 capsule by mouth daily.  Marland Kitchen albuterol (PROVENTIL) (2.5 MG/3ML) 0.083% nebulizer solution Take 3 mLs (2.5 mg total) by nebulization every 6 (six) hours as needed for wheezing or shortness of breath.  . cholecalciferol (VITAMIN D) 1000 units tablet Take 2,000 Units by mouth daily.   . diphenhydrAMINE (BENADRYL) 25 MG tablet Take 25 mg by mouth every 6 (six) hours as needed for allergies.  Marland Kitchen estradiol (ESTRACE VAGINAL) 0.1 MG/GM vaginal cream Apply pea size amount externally around urethra daily for 10 days  . famotidine (PEPCID) 20 MG tablet Take 20 mg by mouth 2 (two) times daily as needed for heartburn or indigestion.  . fexofenadine (ALLEGRA) 180 MG tablet Take 180 mg by mouth daily as needed for allergies.  . Flaxseed, Linseed, (FLAXSEED OIL) 1000 MG CAPS Take 1,000 mg by mouth daily.   . Magnesium 500 MG TABS Take 500 mg by mouth at bedtime.  Marland Kitchen PROAIR HFA 108 (90 Base) MCG/ACT inhaler INHALE 2 PUFFS INTO THE LUNGS EVERY 6 (SIX) HOURS AS NEEDED FOR WHEEZING OR SHORTNESS OF BREATH.  . sodium chloride HYPERTONIC 3 % nebulizer solution Inhale into the lungs.  . vitamin C (ASCORBIC ACID) 500 MG tablet Take 500 mg by mouth daily.   No current facility-administered medications on file prior to visit.     Chief Complaint  Patient presents with  . Follow-up    Pt is doing well overall. Pt has productive cough, sometimes yellow mostly clear.    Pulmonary tests Bronchoscopy 05/29/08 >> scarring RML, cytology negative; MAI, Penicillium, and Aspergillus [place] in BAL Bronchoscopy 12/28/12 >> MAI in BAL, cytology negative PFT 03/01/13 >> FEV1 2.92 (136%), FEV1% 85, TLC 5.91 (124%), DLCO 117%, borderline BD Spirometry 02/15/17 >> FEV1 2.60 (102%), FEV1% 80 FeNO 04/08/17 >> 22  Chest imaging CT chest 02/08/08 >> RML tree in bud, mild BTX RML CT chest 12/22/12 >> RUL and RML tree in bud,  BTX RML CT chest 04/23/15 >> BTX RUL/RML, nodular infiltrates RLL/RML increased in size CT chest 11/23/16 >> cylindrical BTX RUL/RML, scattered nodularity no change  Past medical history GERD, IBS, Hyponatremia  Past surgical history, Family history, Social history, Allergies reviewed  Vital signs BP 124/74 (BP Location: Left Arm, Cuff Size: Normal)   Pulse 75   Ht 5\' 4"  (1.626 m)   Wt 135 lb 9.6 oz (61.5 kg)   LMP 12/14/2004   SpO2 96%   BMI 23.28 kg/m    History of Present Illness: Kim Vasquez is a 66 y.o. female former smoker with chronic cough, localized BTX, and MAI.  She has been doing well.  She hasn't been having cough, wheeze, sputum, chest pain, fever, or hemoptysis.  Hasn't needed to use albuterol much.  Did better with flonase compared to nasonex > nasonex had a very bad taste.  Not using allegra much either.  Has been getting intermittent nose bleeds.  Physical Exam:  General - pleasant Eyes - pupils reactive ENT - no sinus tenderness, no oral exudate, no LAN Cardiac - regular, no murmur Chest - no wheeze, rales Abd - soft, non tender Ext - no edema Skin - no rashes Neuro - normal strength Psych - normal mood  Assessment/Plan:  Allergic asthma. - she is reluctant to use ICS medications - continue prn albuterol  MAI with bronchiectasis. - she is followed by Dr. Dorothyann Peng at [facility] - continue prn hypertonic  saline - defer CT chest imaging unless her symptoms progress  Epistaxis. - advised her to try using a humidifier in her bedroom   Patient Instructions  Follow up in 6 months   Chesley Mires, MD  Pulmonary/Critical Care/Sleep Pager:  [phone] 10/01/2017, 9:22 AM

## 2017-10-03 ENCOUNTER — Encounter: Payer: Self-pay | Admitting: Obstetrics & Gynecology

## 2017-11-30 ENCOUNTER — Other Ambulatory Visit: Payer: Self-pay | Admitting: Obstetrics & Gynecology

## 2017-11-30 NOTE — Telephone Encounter (Signed)
Medication refill request: [email or web address]  Last AEX:  04/13/17 SM Next AEX: 06/06/18 SM Last MMG (if hormonal medication request): 04/13/17 BIRADS1:neg  Refill authorized: 10/01/17 [id number]. Today please advise.

## 2017-12-01 NOTE — Telephone Encounter (Signed)
Could you please call patient and let me know how she is doing and if she is needs a refill on this?

## 2017-12-02 NOTE — Telephone Encounter (Signed)
Patient states she is feeling a lot better with this med and would like to keep taking it.

## 2018-01-17 DIAGNOSIS — Z889 Allergy status to unspecified drugs, medicaments and biological substances status: Secondary | ICD-10-CM | POA: Diagnosis not present

## 2018-01-17 DIAGNOSIS — M25552 Pain in left hip: Secondary | ICD-10-CM | POA: Diagnosis not present

## 2018-01-28 DIAGNOSIS — M25552 Pain in left hip: Secondary | ICD-10-CM | POA: Diagnosis not present

## 2018-02-02 DIAGNOSIS — J300 Vasomotor rhinitis: Secondary | ICD-10-CM | POA: Diagnosis not present

## 2018-02-02 DIAGNOSIS — R5383 Other fatigue: Secondary | ICD-10-CM | POA: Diagnosis not present

## 2018-02-07 DIAGNOSIS — M25552 Pain in left hip: Secondary | ICD-10-CM | POA: Diagnosis not present

## 2018-02-09 DIAGNOSIS — M25552 Pain in left hip: Secondary | ICD-10-CM | POA: Diagnosis not present

## 2018-02-22 ENCOUNTER — Encounter: Payer: Self-pay | Admitting: Genetics

## 2018-05-17 ENCOUNTER — Other Ambulatory Visit: Payer: Self-pay | Admitting: Obstetrics & Gynecology

## 2018-05-17 DIAGNOSIS — Z1231 Encounter for screening mammogram for malignant neoplasm of breast: Secondary | ICD-10-CM

## 2018-05-19 ENCOUNTER — Ambulatory Visit
Admission: RE | Admit: 2018-05-19 | Discharge: 2018-05-19 | Disposition: A | Discharge status: Home / Self Care | Payer: Medicare Other | Source: Ambulatory Visit | Attending: Obstetrics & Gynecology | Admitting: Obstetrics & Gynecology

## 2018-05-19 DIAGNOSIS — Z1231 Encounter for screening mammogram for malignant neoplasm of breast: Secondary | ICD-10-CM

## 2018-06-06 ENCOUNTER — Ambulatory Visit: Payer: Medicare Other | Admitting: Obstetrics & Gynecology

## 2018-06-06 ENCOUNTER — Encounter: Payer: Self-pay | Admitting: Obstetrics & Gynecology

## 2018-06-06 VITALS — BP 110/70 | HR 76 | Resp 16 | Ht 64.25 in | Wt 135.4 lb

## 2018-06-06 DIAGNOSIS — Z01419 Encounter for gynecological examination (general) (routine) without abnormal findings: Secondary | ICD-10-CM | POA: Diagnosis not present

## 2018-06-06 NOTE — Progress Notes (Signed)
67 y.o. G2P2 MarriedCaucasianF here for annual exam.  Doing well.  Off antibiotics for over a year due to MAC.  Seeing ID at [facility], Dr. Dorothyann Vasquez.    Had a fall on ice while ice skating in the winter.  Had an acetabulum fracture.    Denies vaginal bleeding.    Having some issues with side effects when on thyroid medication.  Stopped this and will have follow up with Dr. Inda Vasquez.   Patient's last menstrual period was 12/14/2004.          Sexually active: Yes.    The current method of family planning is post menopausal status.    Exercising: Yes.    gardening, walking Smoker:  No  Health Maintenance: Pap:  04/13/17 Neg   01/07/15 Neg  History of abnormal Pap:  no MMG:  05/19/18 BIRADS1:neg  Colonoscopy:  2012 Normal BMD:   2010.  Has not been interested in doing this again.  Concerned about the risks of radiation exposure. TDaP:  2013 Pneumonia vaccine(s):  2014 Shingrix: No Hep C testing: done - neg Screening Labs: PCP   reports that she quit smoking about 36 years ago. Her smoking use included cigarettes. She has a 14.00 pack-year smoking history. She has never used smokeless tobacco. She reports that she drinks about 4.2 - 8.4 oz of alcohol per week. She reports that she does not use drugs.  Past Medical History:  Diagnosis Date  . Asthma   . GERD (gastroesophageal reflux disease)   . History of anemia after childbirth  . IBS (irritable bowel syndrome)   . Mycobacterium avium-intracellulare complex ([place])   . Osteopenia   . Pulmonary infiltrate   . Seasonal allergies     Past Surgical History:  Procedure Laterality Date  . BREAST EXCISIONAL BIOPSY    . BREAST SURGERY  2/07   left breast  . CARPAL TUNNEL RELEASE  2007      . VIDEO BRONCHOSCOPY  12/28/2012   Procedure: VIDEO BRONCHOSCOPY WITHOUT FLUORO;  Surgeon: Chesley Mires, MD;  Location: [facility];  Service: Cardiopulmonary;  Laterality: Bilateral;    Current Outpatient Medications  Medication Sig Dispense Refill  .  acidophilus (RISAQUAD) CAPS capsule Take 1 capsule by mouth daily.    . cholecalciferol (VITAMIN D) 1000 units tablet Take 2,000 Units by mouth daily.     . diphenhydrAMINE (BENADRYL) 25 MG tablet Take 25 mg by mouth every 6 (six) hours as needed for allergies.    Marland Kitchen estradiol (ESTRACE VAGINAL) 0.1 MG/GM vaginal cream Apply pea size amount externally around urethra daily for 10 days 42.5 g 0  . famotidine (PEPCID) 20 MG tablet Take 20 mg by mouth 2 (two) times daily as needed for heartburn or indigestion.    . fexofenadine (ALLEGRA) 180 MG tablet Take 180 mg by mouth daily as needed for allergies.    . Flaxseed, Linseed, (FLAXSEED OIL) 1000 MG CAPS Take 1,000 mg by mouth daily.     . Magnesium 500 MG TABS Take 500 mg by mouth at bedtime.    Marland Kitchen PROAIR HFA 108 (90 Base) MCG/ACT inhaler INHALE 2 PUFFS INTO THE LUNGS EVERY 6 (SIX) HOURS AS NEEDED FOR WHEEZING OR SHORTNESS OF BREATH. 8.5 Inhaler 3  . vitamin C (ASCORBIC ACID) 500 MG tablet Take 500 mg by mouth daily.     No current facility-administered medications for this visit.     Family History  Problem Relation Age of Onset  . Lymphoma Mother   . Breast cancer  Sister   . Colon cancer Maternal Aunt   . Heart disease Unknown        maternal grandparents  . Diabetes Paternal Grandmother   . Osteoporosis Paternal Grandmother   . Hypertension Father   . Bipolar disorder Sister   . Bipolar disorder Maternal Grandmother     Review of Systems  HENT: Positive for sinus pain.   Genitourinary:       Night urination   All other systems reviewed and are negative.   Exam:   BP 110/70 (BP Location: Left Arm, Patient Position: Sitting, Cuff Size: Normal)   Pulse 76   Resp 16   Ht 5' 4.25" (1.632 m)   Wt 135 lb 6.4 oz (61.4 kg)   LMP 12/14/2004   BMI 23.06 kg/m     Height: 5' 4.25" (163.2 cm)  Ht Readings from Last 3 Encounters:  06/06/18 5' 4.25" (1.632 m)  10/01/17 5' 4"  (1.626 m)  10/01/17 5' 4"  (1.626 m)    General appearance:  alert, cooperative and appears stated age Head: Normocephalic, without obvious abnormality, atraumatic Neck: no adenopathy, supple, symmetrical, trachea midline and thyroid normal to inspection and palpation Lungs: clear to auscultation bilaterally Breasts: normal appearance, no masses or tenderness Heart: regular rate and rhythm Abdomen: soft, non-tender; bowel sounds normal; no masses,  no organomegaly Extremities: extremities normal, atraumatic, no cyanosis or edema Skin: Skin color, texture, turgor normal. No rashes or lesions Lymph nodes: Cervical, supraclavicular, and axillary nodes normal. No abnormal inguinal nodes palpated Neurologic: Grossly normal   Pelvic: External genitalia:  no lesions              Urethra:  normal appearing urethra with no masses, tenderness or lesions              Bartholins and Skenes: normal                 Vagina: normal appearing vagina with normal color and discharge, no lesions              Cervix: no lesions              Pap taken: No. Bimanual Exam:  Uterus:  normal size, contour, position, consistency, mobility, non-tender              Adnexa: normal adnexa and no mass, fullness, tenderness               Rectovaginal: Confirms               Anus:  normal sphincter tone, no lesions  Chaperone was present for exam.  A:  Well Woman with normal exam PMP, no HRT H/O mycobacterium avium.  Followed by Dr. Dorothyann [facility]quez at [facility] PMP, no HRT Vaginal atrophic changes H/o thyroid nodule Osteopenia  P:   Mammogram guidelines reviewed.   pap smear Declines BMD due to radiation exposure Order for Shingrix vaccination given Lab work it UTD--does with Dr. Inda Vasquez Return annually or prn

## 2018-07-20 ENCOUNTER — Telehealth: Payer: Self-pay | Admitting: Obstetrics & Gynecology

## 2018-07-20 NOTE — Telephone Encounter (Signed)
Patient reports skin changes to L Breast and itching. Ongoing for about a month. No fevers or warmth of breast when touched.  Office visit with Dr. Sabra Heck 07/21/18 at 1100. Call back if any increase in symptoms or new concerns.  Encounter closed.

## 2018-07-20 NOTE — Telephone Encounter (Signed)
Patient stated that she has a spot of concern on one of her breasts.

## 2018-07-21 ENCOUNTER — Other Ambulatory Visit: Payer: Self-pay | Admitting: Obstetrics & Gynecology

## 2018-07-21 ENCOUNTER — Other Ambulatory Visit: Payer: Self-pay

## 2018-07-21 ENCOUNTER — Ambulatory Visit: Payer: Medicare Other | Admitting: Obstetrics & Gynecology

## 2018-07-21 ENCOUNTER — Encounter: Payer: Self-pay | Admitting: Obstetrics & Gynecology

## 2018-07-21 VITALS — BP 94/60 | HR 96 | Resp 16 | Ht 64.25 in | Wt 137.0 lb

## 2018-07-21 DIAGNOSIS — N649 Disorder of breast, unspecified: Secondary | ICD-10-CM

## 2018-07-21 DIAGNOSIS — L988 Other specified disorders of the skin and subcutaneous tissue: Secondary | ICD-10-CM

## 2018-07-21 DIAGNOSIS — D235 Other benign neoplasm of skin of trunk: Secondary | ICD-10-CM | POA: Diagnosis not present

## 2018-07-21 NOTE — Progress Notes (Signed)
GYNECOLOGY  VISIT  CC:   Breast lesion  HPI: 67 y.o. G2P2 Married Caucasian female here for left breast skin changes x 2 weeks.  Area is pigmented and she's not completely sure if this is a breast or a skin issue.  Denies trauma.  No nipple discharge.    Last MMG was 05/19/18.  She did a 3D MMG when this was done.  GYNECOLOGIC HISTORY: Patient's last menstrual period was 12/14/2004. Contraception: post menopausal  Menopausal hormone therapy: estrace vag cream   Patient Active Problem List   Diagnosis Date Noted  . Atypical chest pain 04/30/2017  . Chest pressure 04/07/2017  . Palpitations 04/07/2017  . Headache 09/06/2015  . Hyponatremia 08/14/2015  . Hypo-osmolality and hyponatremia 08/14/2015  . Lichen simplex chronicus 06/29/2015  . Shortness of breath on exertion 04/23/2015  . Dyspnea 04/23/2015  . Upper airway cough syndrome 11/30/2013  . Intermittent asthma without complication 38/18/2993  . Chronic cough 01/23/2013  . Localized Bronchiectasis 01/23/2013  . MAI (mycobacterium avium-intracellulare) ([place]) 12/19/2012  . Pulmonary mycobacterial infection ([place]) 12/19/2012    Past Medical History:  Diagnosis Date  . Asthma   . GERD (gastroesophageal reflux disease)   . History of anemia after childbirth  . IBS (irritable bowel syndrome)   . Mycobacterium avium-intracellulare complex ([place])   . Osteopenia   . Pulmonary infiltrate   . Seasonal allergies     Past Surgical History:  Procedure Laterality Date  . BREAST EXCISIONAL BIOPSY    . BREAST SURGERY  2/07   left breast  . CARPAL TUNNEL RELEASE  2007      . VIDEO BRONCHOSCOPY  12/28/2012   Procedure: VIDEO BRONCHOSCOPY WITHOUT FLUORO;  Surgeon: Chesley Mires, MD;  Location: [facility];  Service: Cardiopulmonary;  Laterality: Bilateral;    MEDS:   Current Outpatient Medications on File Prior to Visit  Medication Sig Dispense Refill  . cholecalciferol (VITAMIN D) 1000 units tablet Take 2,000 Units by mouth daily.      . diphenhydrAMINE (BENADRYL) 25 MG tablet Take 25 mg by mouth every 6 (six) hours as needed for allergies.    Marland Kitchen estradiol (ESTRACE VAGINAL) 0.1 MG/GM vaginal cream Apply pea size amount externally around urethra daily for 10 days 42.5 g 0  . famotidine (PEPCID) 20 MG tablet Take 20 mg by mouth 2 (two) times daily as needed for heartburn or indigestion.    . fexofenadine (ALLEGRA) 180 MG tablet Take 180 mg by mouth daily as needed for allergies.    . Flaxseed, Linseed, (FLAXSEED OIL) 1000 MG CAPS Take 1,000 mg by mouth daily.     . hyoscyamine (LEVSIN, ANASPAZ) 0.125 MG tablet Take 1 tablet by mouth daily.    . Magnesium 500 MG TABS Take 500 mg by mouth at bedtime.    Marland Kitchen omeprazole (PRILOSEC) 20 MG capsule Take by mouth daily.  1  . PROAIR HFA 108 (90 Base) MCG/ACT inhaler INHALE 2 PUFFS INTO THE LUNGS EVERY 6 (SIX) HOURS AS NEEDED FOR WHEEZING OR SHORTNESS OF BREATH. 8.5 Inhaler 3  . vitamin C (ASCORBIC ACID) 500 MG tablet Take 500 mg by mouth daily.     No current facility-administered medications on file prior to visit.     ALLERGIES: Accolate [zafirlukast]; Bacitracin; Doxycycline; Erythromycin; Etodolac; Moxifloxacin; Neosporin [neomycin-bacitracin zn-polymyx]; Polysporin [bacitracin-polymyxin b]; and Penicillins  Family History  Problem Relation Age of Onset  . Lymphoma Mother   . Breast cancer Sister   . Colon cancer Maternal Aunt   . Heart disease  Unknown        maternal grandparents  . Diabetes Paternal Grandmother   . Osteoporosis Paternal Grandmother   . Hypertension Father   . Bipolar disorder Sister   . Bipolar disorder Maternal Grandmother     SH:  Married, non smoker  Review of Systems  HENT: Positive for congestion.   Genitourinary: Positive for frequency.  Skin: Positive for rash.  All other systems reviewed and are negative.   PHYSICAL EXAMINATION:    BP 94/60 (BP Location: Right Arm, Patient Position: Sitting, Cuff Size: Normal)   Pulse 96   Resp 16    Ht 5' 4.25" (1.632 m)   Wt 137 lb (62.1 kg)   LMP 12/14/2004   BMI 23.33 kg/m     Physical Exam  Constitutional: She is oriented to person, place, and time. She appears well-developed and well-nourished.  Respiratory: Right breast exhibits no inverted nipple, no mass, no nipple discharge, no skin change and no tenderness. Left breast exhibits skin change. Left breast exhibits no inverted nipple, no mass, no nipple discharge and no tenderness. Breasts are symmetrical.    Lymphadenopathy:    She has no axillary adenopathy.  Neurological: She is alert and oriented to person, place, and time.  Skin: Skin is warm and dry.  Psychiatric: She has a normal mood and affect.   Findings reviewed.  Pt desires removal of lesion.  Consent obtained.   Area cleansed with Betadine x 3.  1.5cc 1% Lidocaine instilled in lesion.  Using sterile technique, lesion fully excised with pick-ups and scissors.  Lesion less than 49mm.  Silver nitrate used for hemostasis.  Dressing applied.  No bleeding noted at end of procedure.  Pt tolerated procedure well.  Chaperone was present for exam.  Assessment: Breast skin lesion, s/p excision today  Plan: Pathology will be called to pt.  I suspect that is going to be benign.

## 2018-07-25 ENCOUNTER — Telehealth: Payer: Self-pay | Admitting: Emergency Medicine

## 2018-07-25 NOTE — Telephone Encounter (Signed)
Patient returned call and message from Dr. Sabra Heck discussed.  Pt verbalized understanding and will follow up prn. No issues to report.

## 2018-07-25 NOTE — Telephone Encounter (Signed)
-----   Message from Megan Salon, MD sent at 07/24/2018  7:19 AM EDT ----- Please let pt know her pathology for the breast skin lesion I removed on Thursday was benign.  It was a pigmented seborrheic keratosis.  This as fully removed so as long as it is healing well, nothing else is needed for treatment/follow-up.

## 2018-09-07 ENCOUNTER — Telehealth: Payer: Self-pay | Admitting: Obstetrics & Gynecology

## 2018-09-07 NOTE — Telephone Encounter (Signed)
Patient called and scheduled an appointment for tomorrow morning, 09/08/18, at 8:15 AM with Dr. Sabra Heck for a rash on her breast near a biopsy site. She'd like a call from the nurse for advice until her appointment. Patient declined an appointment today.  Last seen: 07/21/18

## 2018-09-07 NOTE — Telephone Encounter (Signed)
Return call to patient. She feels like she may have a rash near breast skin that was excised on 07/21/18. She states it is near lesion but not directly on top. She has struggled with some heat rash and used oral diflucan and ketoconazole without relief. She reports area is itching, that is her main concern. Advised can use OTC hydrocortisone ointment on the area and if not redness or streaking of redness and no fevers, okay to wait until appointment tomorrow for evaluation with Dr. Sabra Heck.  Pt will call back with any increase in symptoms.  Encounter closed.

## 2018-09-08 ENCOUNTER — Ambulatory Visit: Payer: Medicare Other | Admitting: Obstetrics & Gynecology

## 2018-09-08 ENCOUNTER — Other Ambulatory Visit: Payer: Self-pay | Admitting: Obstetrics & Gynecology

## 2018-09-08 ENCOUNTER — Encounter: Payer: Self-pay | Admitting: Obstetrics & Gynecology

## 2018-09-08 VITALS — BP 110/60 | HR 68 | Resp 16 | Ht 64.25 in | Wt 135.6 lb

## 2018-09-08 DIAGNOSIS — N649 Disorder of breast, unspecified: Secondary | ICD-10-CM

## 2018-09-08 DIAGNOSIS — L988 Other specified disorders of the skin and subcutaneous tissue: Secondary | ICD-10-CM

## 2018-09-08 MED ORDER — FLUCONAZOLE 150 MG PO TABS: ORAL_TABLET | ORAL | 0 refills | Status: DC

## 2018-09-08 MED ORDER — CICLOPIROX OLAMINE 0.77 % EX CREA: TOPICAL_CREAM | Freq: Two times a day (BID) | CUTANEOUS | 0 refills | Status: DC

## 2018-09-08 NOTE — Progress Notes (Addendum)
GYNECOLOGY  VISIT  CC:   Left breast rash  HPI: 67 y.o. G2P2 Married White or Caucasian female here for rash on L breast that started about a week ago.  It does hurt and itches some.  It is worse when it is hot.  She used ketoconazole topically and this seems to have helped some.  It is still present.    Pt reports she's had a recurrent issue with an uncomfortable sensation underneath her breasts on both size.  She's talked to her dermatologist, Dr. Inda Merlin and ID specialist about this.  There is never a rash.  She has used a topical yeast cream.  This did not help.  Oral ketoconazole has helped.  There never is an associated rash.    GYNECOLOGIC HISTORY: Patient's last menstrual period was 12/14/2004. Contraception: post menopausal  Menopausal hormone therapy: none  Patient Active Problem List   Diagnosis Date Noted  . Atypical chest pain 04/30/2017  . Chest pressure 04/07/2017  . Palpitations 04/07/2017  . Headache 09/06/2015  . Hyponatremia 08/14/2015  . Hypo-osmolality and hyponatremia 08/14/2015  . Lichen simplex chronicus 06/29/2015  . Shortness of breath on exertion 04/23/2015  . Dyspnea 04/23/2015  . Upper airway cough syndrome 11/30/2013  . Intermittent asthma without complication 50/35/4656  . Chronic cough 01/23/2013  . Localized Bronchiectasis 01/23/2013  . MAI (mycobacterium avium-intracellulare) ([place]) 12/19/2012  . Pulmonary mycobacterial infection ([place]) 12/19/2012    Past Medical History:  Diagnosis Date  . Asthma   . GERD (gastroesophageal reflux disease)   . History of anemia after childbirth  . IBS (irritable bowel syndrome)   . Mycobacterium avium-intracellulare complex ([place])   . Osteopenia   . Pulmonary infiltrate   . Seasonal allergies     Past Surgical History:  Procedure Laterality Date  . BREAST EXCISIONAL BIOPSY    . BREAST SURGERY  2/07   left breast  . CARPAL TUNNEL RELEASE  2007      . VIDEO BRONCHOSCOPY  12/28/2012   Procedure: VIDEO  BRONCHOSCOPY WITHOUT FLUORO;  Surgeon: Chesley Mires, MD;  Location: [facility];  Service: Cardiopulmonary;  Laterality: Bilateral;    MEDS:   Current Outpatient Medications on File Prior to Visit  Medication Sig Dispense Refill  . cholecalciferol (VITAMIN D) 1000 units tablet Take 2,000 Units by mouth daily.     . diphenhydrAMINE (BENADRYL) 25 MG tablet Take 25 mg by mouth every 6 (six) hours as needed for allergies.    Marland Kitchen estradiol (ESTRACE VAGINAL) 0.1 MG/GM vaginal cream Apply pea size amount externally around urethra daily for 10 days 42.5 g 0  . famotidine (PEPCID) 20 MG tablet Take 20 mg by mouth 2 (two) times daily as needed for heartburn or indigestion.    . fexofenadine (ALLEGRA) 180 MG tablet Take 180 mg by mouth daily as needed for allergies.    . Flaxseed, Linseed, (FLAXSEED OIL) 1000 MG CAPS Take 1,000 mg by mouth daily.     . Magnesium 500 MG TABS Take 500 mg by mouth at bedtime.    Marland Kitchen PROAIR HFA 108 (90 Base) MCG/ACT inhaler INHALE 2 PUFFS INTO THE LUNGS EVERY 6 (SIX) HOURS AS NEEDED FOR WHEEZING OR SHORTNESS OF BREATH. 8.5 Inhaler 3  . vitamin C (ASCORBIC ACID) 500 MG tablet Take 500 mg by mouth daily.     No current facility-administered medications on file prior to visit.     ALLERGIES: Accolate [zafirlukast]; Bacitracin; Doxycycline; Erythromycin; Etodolac; Moxifloxacin; Neosporin [neomycin-bacitracin zn-polymyx]; Polysporin [bacitracin-polymyxin b]; and Penicillins  Family History  Problem Relation Age of Onset  . Lymphoma Mother   . Breast cancer Sister   . Colon cancer Maternal Aunt   . Heart disease Unknown        maternal grandparents  . Diabetes Paternal Grandmother   . Osteoporosis Paternal Grandmother   . Hypertension Father   . Bipolar disorder Sister   . Bipolar disorder Maternal Grandmother     SH:  Married, non smoker  Review of Systems  HENT: Positive for congestion and ear pain.   Genitourinary:       Night urination   Skin: Positive for rash.   All other systems reviewed and are negative.   PHYSICAL EXAMINATION:    BP 110/60 (BP Location: Right Arm, Patient Position: Sitting, Cuff Size: Normal)   Pulse 68   Resp 16   Ht 5' 4.25" (1.632 m)   Wt 135 lb 9.6 oz (61.5 kg)   LMP 12/14/2004   BMI 23.09 kg/m     Physical Exam  Constitutional: She appears well-developed and well-nourished.  Neck: No thyromegaly present.  Respiratory: Right breast exhibits no skin change. Left breast exhibits skin change. Left breast exhibits no inverted nipple, no mass, no nipple discharge and no tenderness. Breasts are symmetrical.    Lymphadenopathy:    She has no cervical adenopathy.       Left axillary: No pectoral and no lateral adenopathy present.  Assessment: Breast skin rash H/O improvement in the past with fluconazole  Plan: Diflucan 150mg  po x 1, repeat 72 hrs x 2 dosages to pharmacy Ciclopirox 0.77% cream topically BID also prescribed to apply topically. Pt will give update next week about symptoms.

## 2018-09-09 NOTE — Telephone Encounter (Signed)
Patient calling checking on medication refill.

## 2018-09-09 NOTE — Telephone Encounter (Signed)
[facility] sent electronic request for clarification of Diflucan Rx.   Rx sent for Diflucan 150 mg po q 72 hours x 3. Dispense 3 tablets.   Pt notified that new rx has been sent and apologized for the delay as request was sent electronically. Pt  Understands. Will pick up Rx today.  Will follow up next week as instructed by Dr. Sabra Heck.  Encounter to Dr. Sabra Heck and will close.

## 2018-09-19 ENCOUNTER — Ambulatory Visit: Payer: Medicare Other | Admitting: Pulmonary Disease

## 2018-09-21 ENCOUNTER — Encounter: Payer: Self-pay | Admitting: Obstetrics & Gynecology

## 2018-09-22 ENCOUNTER — Telehealth: Payer: Self-pay | Admitting: Obstetrics & Gynecology

## 2018-09-22 NOTE — Telephone Encounter (Signed)
Does she think the Diflucan really helped?  If yes, would be ok to repeat at this time.

## 2018-09-22 NOTE — Telephone Encounter (Signed)
Routing to Dr. Sabra Heck to review and advise.  Pt has used regimen as prescribed as of office visit with Dr. Sabra Heck 09/08/18 and has had specialist visits.

## 2018-09-22 NOTE — Telephone Encounter (Signed)
Patient sent the following correspondence through [place]. Routing to triage to assist patient with request.  My rash has disappeared and the area on my breast no longer hurts. However the skin on my abdomen under my breasts is still painful whenever I am warm. I have been using the topical ciclopirox, which doesnt seem to help much. I am much more comfortable overall, but I would appreciate any advice to completely solve the problem.

## 2018-09-23 ENCOUNTER — Ambulatory Visit: Payer: Medicare Other | Admitting: Pulmonary Disease

## 2018-09-23 ENCOUNTER — Encounter: Payer: Self-pay | Admitting: Pulmonary Disease

## 2018-09-23 VITALS — BP 116/78 | HR 79 | Ht 64.0 in | Wt 139.0 lb

## 2018-09-23 DIAGNOSIS — Z23 Encounter for immunization: Secondary | ICD-10-CM

## 2018-09-23 DIAGNOSIS — J479 Bronchiectasis, uncomplicated: Secondary | ICD-10-CM

## 2018-09-23 DIAGNOSIS — J45909 Unspecified asthma, uncomplicated: Secondary | ICD-10-CM | POA: Diagnosis not present

## 2018-09-23 DIAGNOSIS — A310 Pulmonary mycobacterial infection: Secondary | ICD-10-CM | POA: Diagnosis not present

## 2018-09-23 MED ORDER — FLUCONAZOLE 150 MG PO TABS: ORAL_TABLET | ORAL | 0 refills | Status: DC

## 2018-09-23 NOTE — Patient Instructions (Signed)
Follow up in 1 year.

## 2018-09-23 NOTE — Progress Notes (Signed)
[facility]  Chief Complaint  Patient presents with  . Follow-up    Pt has increase productive cough-yellow with little blood in last month. Pt has some SOB with exertion. Pt would like flu shot today.    Constitutional:  BP 116/78 (BP Location: Left Arm, Cuff Size: Normal)   Pulse 79   Ht 5\' 4"  (1.626 m)   Wt 139 lb (63 kg)   LMP 12/14/2004   SpO2 99%   BMI 23.86 kg/m   Past Medical History:  GERD, IBS, Osteopenia, Hyponatremia  Brief Summary:  Kim Vasquez is a 67 y.o. female former smoker with chronic cough, localized BTX, and MAI.  She has been off therapy for MAI.  Was doing okay until few weeks ago.  Has sinus congestion post nasal drip.  Makes it difficult for her to sleep.  She has been getting cough and has some blood mixed in with sputum.  She is not convinced it is coming from her lungs.  She is not having fever, weight loss, chest pain, or wheeze.  She does feel more fatigued, but not like before she was treated for MAI.  She is reluctant to have additional CT chest imaging due to concern about radiation exposure.  She started using nitric oxide supplement, and hasn't needed to use albuterol after starting this.   Physical Exam:   Appearance - well kempt  ENMT - clear nasal mucosa, midline nasal septum, no oral exudates, no LAN, trachea midline Respiratory - normal chest wall, normal respiratory effort, no accessory muscle use, no wheeze/rales CV - s1s2 regular rate and rhythm, no murmurs, no peripheral edema, radial pulses symmetric GI - soft, non tender, no masses Lymph - no adenopathy noted in neck and axillary areas MSK - normal muscle strength and tone, normal gait Ext - no cyanosis, clubbing, or joint inflammation noted Skin - no rashes, lesions, or ulcers Neuro - oriented to person, place, and time Psych - normal mood and affect  Discussion:  She has recurrent cough with sputum and some blood streaking.  This  could be related to her sinuses and post nasal drip.  She doesn't have much else regarding pulmonary symptoms.  She is reluctant to try additional medications at this time and reluctant to undergo additional testing unless it is clear this will change management options.  Assessment/Plan:   Cough. - continue symptomatic management - if this progresses, then she would be willing to consider additional chest imaging studies  Mycobacterium avium intracellulare infection. - she has f/u with Dr. Dorothyann Peng at [facility] later this month - has been off therapy - if her symptoms progress, then her options for therapy seem limited due to prior resistance patterns   Mild, intermittent asthma. - she reports improvement since starting nitric oxide supplements - high dose flu shot today   Patient Instructions  Follow up in 1 year    Chesley Mires, MD [place] Pager: [phone] 09/23/2018, 11:58 AM  Flow Sheet    Pulmonary tests:  Bronchoscopy 05/29/08 >> scarring RML, cytology negative; MAI, Penicillium, and Aspergillus [place] in BAL Bronchoscopy 12/28/12 >> MAI in BAL, cytology negative PFT 03/01/13 >> FEV1 2.92 (136%), FEV1% 85, TLC 5.91 (124%), DLCO 117%, borderline BD Spirometry 02/15/17 >> FEV1 2.60 (102%), FEV1% 80 FeNO 04/08/17 >> 22  Cardiac tests:  CT chest 02/08/08 >> RML tree in bud, mild BTX RML CT chest 12/22/12 >> RUL and RML tree in bud, BTX RML CT chest  04/23/15 >> BTX RUL/RML, nodular infiltrates RLL/RML increased in size CT chest 11/23/16 >> cylindrical BTX RUL/RML, scattered nodularity no change  Medications:   Allergies as of 09/23/2018      Reactions   Accolate [zafirlukast]    Heart started skipping beats   Bacitracin Itching, Other (See Comments)   Reaction:  Numbness    Doxycycline Other (See Comments)   Reaction:  Stiff neck and headache    Erythromycin Other (See Comments)   Reaction:  Decreased urine output   Etodolac Other (See Comments)    Reaction:  Dizziness    Moxifloxacin Other (See Comments)   Reaction:  Stiff neck, dizziness, and headache   Neosporin [neomycin-bacitracin Zn-polymyx] Itching   Polysporin [bacitracin-polymyxin B] Itching   Penicillins Itching, Rash, Other (See Comments)   Has patient had a PCN reaction causing immediate rash, facial/tongue/throat swelling, SOB or lightheadedness with hypotension: No Has patient had a PCN reaction causing severe rash involving mucus membranes or skin necrosis: No Has patient had a PCN reaction that required hospitalization No Has patient had a PCN reaction occurring within the last 10 years: No If all of the above answers are "NO", then may proceed with Cephalosporin use.      Medication List        Accurate as of 09/23/18 11:58 AM. Always use your most recent med list.          cholecalciferol 1000 units tablet Commonly known as:  VITAMIN D Take 2,000 Units by mouth daily.   ciclopirox 0.77 % cream Commonly known as:  LOPROX Apply topically 2 (two) times daily.   diphenhydrAMINE 25 MG tablet Commonly known as:  BENADRYL Take 25 mg by mouth every 6 (six) hours as needed for allergies.   estradiol 0.1 MG/GM vaginal cream Commonly known as:  ESTRACE Apply pea size amount externally around urethra daily for 10 days   famotidine 20 MG tablet Commonly known as:  PEPCID Take 20 mg by mouth 2 (two) times daily as needed for heartburn or indigestion.   fexofenadine 180 MG tablet Commonly known as:  ALLEGRA Take 180 mg by mouth daily as needed for allergies.   Flaxseed Oil 1000 MG Caps Take 1,000 mg by mouth daily.   fluconazole 150 MG tablet Commonly known as:  DIFLUCAN TAKE 1 TABLET BY MOUTH EVERY 72 HOURS FOR 3 DOSES   Magnesium 500 MG Tabs Take 500 mg by mouth at bedtime.   PROAIR HFA 108 (90 Base) MCG/ACT inhaler Generic drug:  albuterol INHALE 2 PUFFS INTO THE LUNGS EVERY 6 (SIX) HOURS AS NEEDED FOR WHEEZING OR SHORTNESS OF BREATH.   vitamin C  500 MG tablet Commonly known as:  ASCORBIC ACID Take 500 mg by mouth daily.       Past Surgical History:  She  has a past surgical history that includes Carpal tunnel release (2007); Video bronchoscopy (12/28/2012); Breast surgery (2/07); and Breast excisional biopsy.  Family History:  Her family history includes Bipolar disorder in her maternal grandmother and sister; Breast cancer in her sister; Colon cancer in her maternal aunt; Diabetes in her paternal grandmother; Heart disease in her unknown relative; Hypertension in her father; Lymphoma in her mother; Osteoporosis in her paternal grandmother.  Social History:  She  reports that she quit smoking about 36 years ago. Her smoking use included cigarettes. She has a 14.00 pack-year smoking history. She has never used smokeless tobacco. She reports that she drinks about 7.0 - 14.0 standard drinks of alcohol per  week. She reports that she does not use drugs.

## 2018-09-23 NOTE — Telephone Encounter (Signed)
Message left to return call to Dustee Bottenfield at [phone].    

## 2018-09-27 NOTE — Telephone Encounter (Signed)
Pt read mychart message from this RN on   Last read by Lorie Phenix at 10:50 AM on 09/23/2018. She did not return call to the office.

## 2018-10-12 NOTE — Telephone Encounter (Signed)
OK to close encounter or is further follow up necessary? °

## 2018-10-12 NOTE — Telephone Encounter (Signed)
Encounter closed

## 2018-10-24 DIAGNOSIS — J324 Chronic pansinusitis: Secondary | ICD-10-CM | POA: Insufficient documentation

## 2019-01-16 ENCOUNTER — Ambulatory Visit: Payer: Medicare Other | Admitting: Pulmonary Disease

## 2019-01-16 ENCOUNTER — Encounter: Payer: Self-pay | Admitting: Pulmonary Disease

## 2019-01-16 VITALS — BP 120/82 | HR 83 | Ht 64.0 in | Wt 135.0 lb

## 2019-01-16 DIAGNOSIS — R05 Cough: Secondary | ICD-10-CM

## 2019-01-16 DIAGNOSIS — R059 Cough, unspecified: Secondary | ICD-10-CM

## 2019-01-16 LAB — POCT EXHALED NITRIC OXIDE: FeNO level (ppb): 64

## 2019-01-16 NOTE — Progress Notes (Signed)
[facility]  Chief Complaint  Patient presents with  . Follow-up    Pt feels more asthma flares than normal, has productive cough-yellow and increase SOB with exertion.    Constitutional:  BP 120/82 (BP Location: Left Arm, Cuff Size: Normal)   Pulse 83   Ht 5\' 4"  (1.626 m)   Wt 135 lb (61.2 kg)   LMP 12/14/2004   SpO2 97%   BMI 23.17 kg/m   Past Medical History:  GERD, IBS, Osteopenia, Hyponatremia  Brief Summary:  Kim Vasquez is a 68 y.o. female former smoker with chronic cough, localized BTX, and MAI.  She is getting more cough.  She feels tight in her chest.  Brings up sputum sometimes.  Feels like her asthma is getting worse.  She takes nitric oxide supplements to help reduce risk of MAI recurrence.  Has been using albuterol more regularly.  Had respiratory infection a month ago, and was using albuterol every 4 hours.  Saw Dr. Wilburn Cornelia with ENT, and didn't need anything surgically for sinuses.  Last saw Dr. Dorothyann Peng at [facility] in October 2019, and plan was to monitor clinically off therapy for MAI.  Physical Exam:   Appearance - well kempt   ENMT - no sinus tenderness, no nasal discharge, no oral exudate  Neck - no masses, trachea midline, no thyromegaly, no elevation in JVP  Respiratory - normal appearance of chest wall, normal respiratory effort w/o accessory muscle use, no dullness on percussion, no wheezing or rales  CV - s1s2 regular rate and rhythm, no murmurs, no peripheral edema, radial pulses symmetric  GI - soft, non tender  Lymph - no adenopathy noted in neck and axillary areas  MSK - normal gait  Ext - no cyanosis, clubbing, or joint inflammation noted  Skin - no rashes, lesions, or ulcers  Neuro - normal strength, oriented x 3  Psych - normal mood and affect   Discussion:  Her symptom description is consistent with asthma.  She did also have elevated FeNO.  She takes nitric oxide supplements, but I am not  sure that this would impact FeNO results.  She has reluctantly agreed to do short trial of steroid inhaler therapy.  Assessment/Plan:   Progressive cough likely from asthma. 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 - followed by Dr. Dorothyann Peng at [facility] - monitor for recurrence while we try her on inhaled steroids for probable asthma - if her symptoms progress, then her options for therapy seem limited due to prior resistance patterns    Patient Instructions  Symbicort two puffs twice per day, and rinse mouth after each use  Follow up in 2 weeks with Dr. Halford Chessman     A total of  27 minutes were spent face to face with the patient and more than half of that time involved counseling or coordination of care.   Chesley Mires, MD [facility] Pager: [phone] 01/16/2019, 6:02 PM  Flow Sheet    Pulmonary tests:  Bronchoscopy 05/29/08 >> scarring RML, cytology negative; MAI, Penicillium, and Aspergillus [place] in BAL Bronchoscopy 12/28/12 >> MAI in BAL, cytology negative PFT 03/01/13 >> FEV1 2.92 (136%), FEV1% 85, TLC 5.91 (124%), DLCO 117%, borderline BD Spirometry 02/15/17 >> FEV1 2.60 (102%), FEV1% 80 FeNO 04/08/17 >> 22 FeNO 01/16/19 >> 64 Spirometry  01/16/19 >> FEV1 2.5 (105%), FEV1% 80  Cardiac tests:  CT chest 02/08/08 >> RML tree in bud, mild BTX RML CT chest 12/22/12 >> RUL and RML tree in bud, BTX RML CT chest 04/23/15 >> BTX RUL/RML, nodular infiltrates RLL/RML increased in size CT chest 11/23/16 >> cylindrical BTX RUL/RML, scattered nodularity no change  Medications:   Allergies as of 01/16/2019      Reactions   Accolate [zafirlukast]    Heart started skipping beats   Bacitracin Itching, Other (See Comments)   Reaction:  Numbness    Doxycycline Other (See Comments)   Reaction:   Stiff neck and headache    Erythromycin Other (See Comments)   Reaction:  Decreased urine output   Etodolac Other (See Comments)   Reaction:  Dizziness    Moxifloxacin Other (See Comments)   Reaction:  Stiff neck, dizziness, and headache   Neosporin [neomycin-bacitracin Zn-polymyx] Itching   Polysporin [bacitracin-polymyxin B] Itching   Penicillins Itching, Rash, Other (See Comments)   Has patient had a PCN reaction causing immediate rash, facial/tongue/throat swelling, SOB or lightheadedness with hypotension: No Has patient had a PCN reaction causing severe rash involving mucus membranes or skin necrosis: No Has patient had a PCN reaction that required hospitalization No Has patient had a PCN reaction occurring within the last 10 years: No If all of the above answers are "NO", then may proceed with Cephalosporin use.      Medication List       Accurate as of January 16, 2019  6:02 PM. Always use your most recent med list.        cholecalciferol 1000 units tablet Commonly known as:  VITAMIN D Take 2,000 Units by mouth daily.   diphenhydrAMINE 25 MG tablet Commonly known as:  BENADRYL Take 25 mg by mouth every 6 (six) hours as needed for allergies.   estradiol 0.1 MG/GM vaginal cream Commonly known as:  ESTRACE VAGINAL Apply pea size amount externally around urethra daily for 10 days   famotidine 20 MG tablet Commonly known as:  PEPCID Take 20 mg by mouth 2 (two) times daily as needed for heartburn or indigestion.   fexofenadine 180 MG tablet Commonly known as:  ALLEGRA Take 180 mg by mouth daily as needed for allergies.   Flaxseed Oil 1000 MG Caps Take 1,000 mg by mouth daily.   Magnesium 500 MG Tabs Take 500 mg by mouth at bedtime.   PROAIR HFA 108 (90 Base) MCG/ACT inhaler Generic drug:  albuterol INHALE 2 PUFFS INTO THE LUNGS EVERY 6 (SIX) HOURS AS NEEDED FOR WHEEZING OR SHORTNESS OF BREATH.   vitamin C 500 MG tablet Commonly known as:  ASCORBIC ACID Take  500 mg by mouth daily.       Past Surgical History:  She  has a past surgical history that includes Carpal tunnel release (2007); Video bronchoscopy (12/28/2012); Breast surgery (2/07); and Breast excisional biopsy.  Family History:  Her family history includes Bipolar disorder in her maternal grandmother and sister; Breast cancer in her sister; Colon cancer in her maternal aunt; Diabetes in her paternal grandmother; Heart disease in an other family member; Hypertension in her father; Lymphoma in her mother; Osteoporosis in her paternal grandmother.  Social History:  She  reports that she quit smoking about 37 years ago. Her smoking use included cigarettes. She has a 14.00 pack-year smoking history. She has never used smokeless tobacco. She reports current alcohol use of about 7.0 - 14.0 standard drinks of alcohol per  week. She reports that she does not use drugs.

## 2019-01-16 NOTE — Patient Instructions (Signed)
Symbicort two puffs twice per day, and rinse mouth after each use  Follow up in 2 weeks with Dr. Halford Chessman

## 2019-01-27 ENCOUNTER — Telehealth: Payer: Self-pay | Admitting: Pulmonary Disease

## 2019-01-27 NOTE — Telephone Encounter (Signed)
Called and spoke with pt in regards to message that was received.  Further spoke with pt in regards to a NP visit and after further speaking with pt, pt stated she would go ahead and schedule an OV with a NP. appt scheduled with Wyn Quaker, NP 01/31/2019 at 11:15. Nothing further needed.

## 2019-01-31 ENCOUNTER — Ambulatory Visit: Payer: Medicare Other | Admitting: Pulmonary Disease

## 2019-01-31 ENCOUNTER — Encounter: Payer: Self-pay | Admitting: Pulmonary Disease

## 2019-01-31 ENCOUNTER — Telehealth: Payer: Self-pay | Admitting: Pulmonary Disease

## 2019-01-31 VITALS — BP 122/78 | HR 79 | Ht 65.5 in | Wt 136.2 lb

## 2019-01-31 DIAGNOSIS — R05 Cough: Secondary | ICD-10-CM

## 2019-01-31 DIAGNOSIS — J479 Bronchiectasis, uncomplicated: Secondary | ICD-10-CM

## 2019-01-31 DIAGNOSIS — A310 Pulmonary mycobacterial infection: Secondary | ICD-10-CM | POA: Diagnosis not present

## 2019-01-31 DIAGNOSIS — J45909 Unspecified asthma, uncomplicated: Secondary | ICD-10-CM | POA: Diagnosis not present

## 2019-01-31 DIAGNOSIS — R053 Chronic cough: Secondary | ICD-10-CM

## 2019-01-31 LAB — NITRIC OXIDE: Nitric Oxide: 27

## 2019-01-31 MED ORDER — ALBUTEROL SULFATE HFA 108 (90 BASE) MCG/ACT IN AERS: 2.0000 | INHALATION_SPRAY | Freq: Four times a day (QID) | RESPIRATORY_TRACT | 3 refills | Status: DC | PRN

## 2019-01-31 MED ORDER — BUDESONIDE-FORMOTEROL FUMARATE 80-4.5 MCG/ACT IN AERO: 2.0000 | INHALATION_SPRAY | Freq: Two times a day (BID) | RESPIRATORY_TRACT | 12 refills | Status: DC | PRN

## 2019-01-31 NOTE — Progress Notes (Signed)
@Patient  ID: Kim Vasquez, female    DOB: 1951/09/22, 68 y.o.   MRN: [id number]  Chief Complaint  Patient presents with  . Follow-up    follows for asthma and cough. patient was given a sample of symbicort, this did not help    Referring provider: No ref. provider found  HPI:  68 year old female former smoker followed in our office for asthma, bronchiectasis, and MAI (followed by Dr. Dorothyann Peng at [facility], currently we are monitoring for recurrence while we try her on inhaled steroids for probable asthma, if her symptoms progress, then her options for therapy seem limited due to prior resistance patterns)   PMH: GERD, IBS, Osteopenia, Hyponatremia Smoker/ Smoking History: Former smoker Maintenance: Symbicort 57 Pt of: Dr. Halford Chessman  01/31/2019  - Visit   68 year old female former smoker presenting to our office today for a 2-week follow-up visit.  Patient was trialed on Symbicort 80 at last office visit due to elevated FeNO and suspected asthma.  Plan is to repeat FeNO today in office.  Patient reports that she had no improvement in her respiratory symptoms when taking Symbicort 80.  She also did have sore throat, dry mouth but patient was not rinsing mouth out after use of Symbicort 80.  Patient also reports that she had a reaction to her cefdinir yesterday that she was being treated by primary care due to suspected sinusitis.  Patient reports that her reaction of the cefdinir was itchy eyes as well as splotchy areas on her skin.  The symptoms have resolved today.  Patient does endorse that she had increased chest tightness at that time.  She contacted primary care who informed her to keep follow-up with them today (01/31/2019) and to present to an emergency room if symptoms worsen.    Patient is also followed by ENT Dr. Wilburn Cornelia.  Patient also followed by [facility] for known MAI.   Tests:   Pulmonary tests:  Bronchoscopy 05/29/08 >> scarring RML, cytology negative; MAI, Penicillium, and Aspergillus  [place] in BAL Bronchoscopy 12/28/12 >> MAI in BAL, cytology negative PFT 03/01/13 >> FEV1 2.92 (136%), FEV1% 85, TLC 5.91 (124%), DLCO 117%, borderline BD Spirometry 02/15/17 >> FEV1 2.60 (102%), FEV1% 80 FeNO 04/08/17 >> 22 FeNO 01/16/19 >> 64 Spirometry 01/16/19 >> FEV1 2.5 (105%), FEV1% 80  Cardiac tests:  CT chest 02/08/08 >> RML tree in bud, mild BTX RML CT chest 12/22/12 >> RUL and RML tree in bud, BTX RML CT chest 04/23/15 >> BTX RUL/RML, nodular infiltrates RLL/RML increased in size CT chest 11/23/16 >> cylindrical BTX RUL/RML, scattered nodularity no change  06/08/2017-CBC with differential- eosinophils absolute 0.2 06/08/2017-respiratory allergy panel-no elevations, IgE 49  FENO:  Lab Results  Component Value Date   NITRICOXIDE 27 01/31/2019    PFT: No flowsheet data found.  Imaging: No results found.    Specialty Problems      Pulmonary Problems   Pulmonary mycobacterial infection ([place])    Overview:  Last Assessment & Plan:  She feels better since she has been on medicattons- Na, headaches, fatigue, DOE.  She has not tolerated the current rx's . Will ask Natl [facility], [facility] for opinions on how to proccede. She is onto 2nd line at best.  I also offered to refer her to [facility] or [facility].  I have asked her to stop [facility] so as not to be on mono-therapy.   I offered her my apologies for the disconnect in communication that she felt.    I will call her with  the recommendations as soon as we have them. Cautioned her that amikacin IV may be one the recommendations. She is not keen to be off rx at this point due to her improvement over the last several months.         Bronchiectasis without complication (HCC)    CT chest 11/23/16 >> cylindrical BTX RUL/RML, scattered nodularity no change       Chronic cough    06/08/2017-CBC with differential- eosinophils absolute 0.2 06/08/2017-respiratory allergy panel-no elevations, IgE 49       Intermittent asthma without complication   Upper  airway cough syndrome   Dyspnea   Shortness of breath on exertion   Extrinsic asthma without complication    FeNO 1/60/73 >> 22 FeNO 01/16/19 >> 64 FeNO 01/31/2019 >> 27  06/08/2017-CBC with differential- eosinophils absolute 0.2 06/08/2017-respiratory allergy panel-no elevations, IgE 49          Allergies  Allergen Reactions  . Accolate [Zafirlukast]     Heart started skipping beats  . Bacitracin Itching and Other (See Comments)    Reaction:  Numbness   . Cefdinir Itching    Itching eyes, hot rash all over body  . Doxycycline Other (See Comments)    Reaction:  Stiff neck and headache   . Erythromycin Other (See Comments)    Reaction:  Decreased urine output  . Etodolac Other (See Comments)    Reaction:  Dizziness   . Moxifloxacin Other (See Comments)    Reaction:  Stiff neck, dizziness, and headache  . Neosporin [Neomycin-Bacitracin Zn-Polymyx] Itching  . Polysporin [Bacitracin-Polymyxin B] Itching  . Penicillins Itching, Rash and Other (See Comments)    Has patient had a PCN reaction causing immediate rash, facial/tongue/throat swelling, SOB or lightheadedness with hypotension: No Has patient had a PCN reaction causing severe rash involving mucus membranes or skin necrosis: No Has patient had a PCN reaction that required hospitalization No Has patient had a PCN reaction occurring within the last 10 years: No If all of the above answers are "NO", then may proceed with Cephalosporin use.    Immunization History  Administered Date(s) Administered  . Influenza Split 01/23/2013, 08/18/2016, 08/20/2017  . Influenza, High Dose Seasonal PF 09/23/2018  . Influenza,inj,Quad PF,6+ Mos 07/16/2014, 08/14/2015  . Influenza-Unspecified 07/16/2014, 08/14/2015, 07/21/2016  . Pneumococcal Polysaccharide-23 01/23/2013  . Zoster Recombinat (Shingrix) 09/23/2018, 01/17/2019   Patient defers Pneumovax 23 today.  Past Medical History:  Diagnosis Date  . Asthma   . GERD  (gastroesophageal reflux disease)   . History of anemia after childbirth  . IBS (irritable bowel syndrome)   . Mycobacterium avium-intracellulare complex ([place])   . Osteopenia   . Pulmonary infiltrate   . Seasonal allergies     Tobacco History: Social History   Tobacco Use  Smoking Status Former Smoker  . Packs/day: 1.00  . Years: 14.00  . Pack years: 14.00  . Types: Cigarettes  . Last attempt to quit: 12/14/1981  . Years since quitting: 37.1  Smokeless Tobacco Never Used  Tobacco Comment   30 + years ago   Counseling given: Yes Comment: 30 + years ago  Continue to not smoke  Outpatient Encounter Medications as of 01/31/2019  Medication Sig  . albuterol (PROAIR HFA) 108 (90 Base) MCG/ACT inhaler Inhale 2 puffs into the lungs every 6 (six) hours as needed for wheezing or shortness of breath.  . cholecalciferol (VITAMIN D) 1000 units tablet Take 2,000 Units by mouth daily.   . diphenhydrAMINE (BENADRYL) 25 MG tablet  Take 25 mg by mouth every 6 (six) hours as needed for allergies.  Marland Kitchen estradiol (ESTRACE VAGINAL) 0.1 MG/GM vaginal cream Apply pea size amount externally around urethra daily for 10 days (Patient taking differently: Place 1 Applicatorful vaginally as needed. Apply pea size amount externally around urethra daily for 10 days)  . famotidine (PEPCID) 20 MG tablet Take 20 mg by mouth 2 (two) times daily as needed for heartburn or indigestion.  . fexofenadine (ALLEGRA) 180 MG tablet Take 180 mg by mouth daily as needed for allergies.  . Flaxseed, Linseed, (FLAXSEED OIL) 1000 MG CAPS Take 1,000 mg by mouth daily.   . Magnesium 500 MG TABS Take 500 mg by mouth at bedtime.  . vitamin C (ASCORBIC ACID) 500 MG tablet Take 500 mg by mouth daily.  . [DISCONTINUED] PROAIR HFA 108 (90 Base) MCG/ACT inhaler INHALE 2 PUFFS INTO THE LUNGS EVERY 6 (SIX) HOURS AS NEEDED FOR WHEEZING OR SHORTNESS OF BREATH.  . budesonide-formoterol (SYMBICORT) 80-4.5 MCG/ACT inhaler Inhale 2 puffs into the  lungs 2 (two) times daily as needed.   No facility-administered encounter medications on file as of 01/31/2019.      Review of Systems  Review of Systems  Constitutional: Negative for chills, fatigue, fever and unexpected weight change.  HENT: Negative for congestion, postnasal drip, sinus pressure and sinus pain.   Respiratory: Positive for chest tightness (Yesterday during reaction to cefdinir). Negative for cough, shortness of breath and wheezing.   Cardiovascular: Negative for chest pain and palpitations.  Gastrointestinal: Negative for diarrhea, nausea and vomiting.       Patient reports weekly breakthrough acid reflux, which she treats with Tums or occasionally double dosing on Pepcid  Skin: Positive for color change.       Reaction to cefdinir yesterday causing splotchy areas on skin  Allergic/Immunologic: Positive for environmental allergies (Known environmental allergies to mold per patient). Negative for food allergies.  Neurological: Negative for dizziness, light-headedness and headaches.  Psychiatric/Behavioral: Negative for dysphoric mood. The patient is not nervous/anxious.   All other systems reviewed and are negative.    Physical Exam  BP 122/78 (BP Location: Left Arm, Cuff Size: Normal)   Pulse 79   Ht 5' 5.5" (1.664 m)   Wt 136 lb 3.2 oz (61.8 kg)   LMP 12/14/2004   SpO2 100%   BMI 22.32 kg/m   Wt Readings from Last 5 Encounters:  01/31/19 136 lb 3.2 oz (61.8 kg)  01/16/19 135 lb (61.2 kg)  09/23/18 139 lb (63 kg)  09/08/18 135 lb 9.6 oz (61.5 kg)  07/21/18 137 lb (62.1 kg)     Physical Exam  Constitutional: She is oriented to person, place, and time and well-developed, well-nourished, and in no distress. No distress.  HENT:  Head: Normocephalic and atraumatic.  Right Ear: Hearing, tympanic membrane, external ear and ear canal normal.  Left Ear: Hearing, tympanic membrane, external ear and ear canal normal.  Nose: Mucosal edema present. Right sinus  exhibits no maxillary sinus tenderness and no frontal sinus tenderness. Left sinus exhibits no maxillary sinus tenderness and no frontal sinus tenderness.  Mouth/Throat: Uvula is midline and oropharynx is clear and moist. No oropharyngeal exudate.  Small amount of cerumen in right ear canal  Eyes: Pupils are equal, round, and reactive to light.  Neck: Normal range of motion. Neck supple.  Cardiovascular: Normal rate, regular rhythm and normal heart sounds.  Pulmonary/Chest: Effort normal and breath sounds normal. No accessory muscle usage. No respiratory distress. She has  no decreased breath sounds. She has no wheezes. She has no rhonchi. She has no rales.  Abdominal: Soft. Bowel sounds are normal. She exhibits no distension. There is no abdominal tenderness. There is no guarding.  Musculoskeletal: Normal range of motion.  Lymphadenopathy:    She has no cervical adenopathy.  Neurological: She is alert and oriented to person, place, and time. Gait normal.  Skin: Skin is warm and dry. She is not diaphoretic.  Psychiatric: Mood, memory, affect and judgment normal.  Nursing note and vitals reviewed.     Lab Results:  CBC    Component Value Date/Time   WBC 6.0 06/08/2017 1149   RBC 4.31 06/08/2017 1149   HGB 13.9 06/08/2017 1149   HGB 12.7 01/07/2015 1533   HCT 39.8 06/08/2017 1149   PLT 236.0 06/08/2017 1149   MCV 92.3 06/08/2017 1149   MCH 32.1 04/07/2017 1201   MCHC 34.9 06/08/2017 1149   RDW 12.1 06/08/2017 1149   LYMPHSABS 1.0 06/08/2017 1149   MONOABS 0.6 06/08/2017 1149   EOSABS 0.2 06/08/2017 1149   BASOSABS 0.0 06/08/2017 1149    BMET    Component Value Date/Time   NA 135 04/07/2017 1201   K 4.1 04/07/2017 1201   CL 100 (L) 04/07/2017 1201   CO2 28 04/07/2017 1201   GLUCOSE 104 (H) 04/07/2017 1201   BUN 9 04/07/2017 1201   CREATININE 0.62 04/07/2017 1201   CREATININE 0.65 09/25/2015 1229   CALCIUM 9.3 04/07/2017 1201   GFRNONAA >60 04/07/2017 1201   GFRNONAA  >89 01/24/2015 0833   GFRAA >60 04/07/2017 1201   GFRAA >89 01/24/2015 0833    BNP    Component Value Date/Time   BNP 35.5 04/07/2017 1418    ProBNP No results found for: PROBNP    Assessment & Plan:     Extrinsic asthma without complication Assessment: Recent elevated FeNO of 64 Today FeNO of 27 2-week trial of Symbicort 80, patient reports no improvement of symptoms Lungs clear to auscultation today 2018 CBC with differential shows peripheral eosinophilia 2018 respiratory allergy panel shows an IgE of 49 2018 rest of allergy panel shows no other elevations Patient reports 2019 allergy work-up which shows minor elevation to mold Patient currently takes an antihistamine as needed  Plan: Start Symbicort 80 2 puffs twice daily as needed as needed for management of suspected asthma Refilled rescue inhaler to use every 4-6 hours as needed for shortness of breath and wheezing Patient have close follow-up with Dr. Halford Chessman in 4 to 6 weeks to evaluate how she is doing with as needed Symbicort I encouraged patient to take a daily antihistamine  Bronchiectasis without complication ([place]) Assessment: Cylindrical bronchiectasis on 2017 CT chest  Plan: Continue follow-up with [facility] for known MAI Continue follow-up with Dr. Halford Chessman in 4 to 6 weeks   Pulmonary mycobacterial infection ([place]) Assessment: Known mycobacterial infection Currently managed by [facility] Patient is not on any current therapies right now  Plan: Continue follow-up with [facility]  Chronic cough Assessment: Takes antihistamine as needed Takes 20 mg Pepcid daily  Plan: Encouraged patient to start taking antihistamine daily Continue 20 mg Pepcid daily     Lauraine Rinne, NP 01/31/2019   This appointment was 35 min long with over 50% of the time in direct face-to-face patient care, assessment, plan of care, and follow-up.

## 2019-01-31 NOTE — Assessment & Plan Note (Addendum)
Assessment: Cylindrical bronchiectasis on 2017 CT chest  Plan: Continue follow-up with [facility] for known MAI Continue follow-up with Dr. Halford Chessman in 4 to 6 weeks

## 2019-01-31 NOTE — Progress Notes (Signed)
Discussed results with patient in office.  Nothing further is needed at this time.  Kolleen Ochsner FNP  

## 2019-01-31 NOTE — Assessment & Plan Note (Signed)
Assessment: Known mycobacterial infection Currently managed by [facility] Patient is not on any current therapies right now  Plan: Continue follow-up with [facility]

## 2019-01-31 NOTE — Assessment & Plan Note (Signed)
Assessment: Recent elevated FeNO of 64 Today FeNO of 27 2-week trial of Symbicort 80, patient reports no improvement of symptoms Lungs clear to auscultation today 2018 CBC with differential shows peripheral eosinophilia 2018 respiratory allergy panel shows an IgE of 49 2018 rest of allergy panel shows no other elevations Patient reports 2019 allergy work-up which shows minor elevation to mold Patient currently takes an antihistamine as needed  Plan: Start Symbicort 80 2 puffs twice daily as needed as needed for management of suspected asthma Refilled rescue inhaler to use every 4-6 hours as needed for shortness of breath and wheezing Patient have close follow-up with Dr. Halford Chessman in 4 to 6 weeks to evaluate how she is doing with as needed Symbicort I encouraged patient to take a daily antihistamine

## 2019-01-31 NOTE — Assessment & Plan Note (Signed)
Assessment: Takes antihistamine as needed Takes 20 mg Pepcid daily  Plan: Encouraged patient to start taking antihistamine daily Continue 20 mg Pepcid daily

## 2019-01-31 NOTE — Patient Instructions (Addendum)
FENO today >>>27   Continue Symbicort 80 as needed for shortness of breath or wheezing >>> 2 puffs in the morning right when you wake up, rinse out your mouth after use, 12 hours later 2 puffs, rinse after use  Only use your albuterol as a rescue medication to be used if you can't catch your breath by resting or doing a relaxed purse lip breathing pattern.  - The less you use it, the better it will work when you need it. - Ok to use up to 2 puffs  every 4 hours if you must but call for immediate appointment if use goes up over your usual need - Don't leave home without it !!  (think of it like the spare tire for your car)     Follow up with Dr. Halford Chessman in 4-6 weeks  Continue follow up with [facility]   It is flu season:   >>>Remember to be washing your hands regularly, using hand sanitizer, be careful to use around herself with has contact with people who are sick will increase her chances of getting sick yourself. >>> Best ways to protect herself from the flu: Receive the yearly flu vaccine, practice good hand hygiene washing with soap and also using hand sanitizer when available, eat a nutritious meals, get adequate rest, hydrate appropriately   Please contact the office if your symptoms worsen or you have concerns that you are not improving.   Thank you for choosing [facility] for your healthcare, and for allowing [place] to partner with you on your healthcare journey. I am thankful to be able to provide care to you today.   Wyn Quaker FNP-C

## 2019-01-31 NOTE — Telephone Encounter (Signed)
Patient has it documented in her chart that she received her flu vaccine on 09/23/2018. We were calling to receive information on further documented pneumonia vaccines. Last documented pneumonia vaccine was documented for 01/23/2013. Patient stated that she received a pneumonia vaccine since that time. I have called patients PCP back to verify further information on pneumonia vaccines. [facility] and was transferred to the nurses line. Line went to voicemail. Left message to give [place] a call back.

## 2019-02-01 NOTE — Telephone Encounter (Signed)
Sandy returning call cb# [phone]//YLT

## 2019-02-01 NOTE — Telephone Encounter (Signed)
Attempted to contact [place] with Dr. Geraldo Docker office. I was placed on a long hold with no one coming to the line. Will try back.

## 2019-02-01 NOTE — Telephone Encounter (Signed)
Called and spoke with Halfway herself. Advised her that we are needing the pneumonia vaccine information as we have that the patient was given her flu vaccine here in our office. Lovey Newcomer stated that the patient was given the prevar 13 injection on 08/03/2018 and no others since. Flu vaccine information given to sandy so that they can have an updated chart. Before hanging up Lovey Newcomer stated that the patient received her TDAP on 05/30/2012 and she is updated on that for now. All information updated in our system. Nothing further needed.

## 2019-02-06 NOTE — Progress Notes (Signed)
Reviewed and agree with assessment/plan.   Klarisa Barman, MD [facility] 12/09/2016, 12:24 PM Pager:  [phone]  

## 2019-03-06 ENCOUNTER — Ambulatory Visit: Payer: Medicare Other | Admitting: Pulmonary Disease

## 2019-05-15 ENCOUNTER — Ambulatory Visit: Payer: Medicare Other | Admitting: Pulmonary Disease

## 2019-06-28 ENCOUNTER — Other Ambulatory Visit: Payer: Self-pay | Admitting: Obstetrics & Gynecology

## 2019-06-28 DIAGNOSIS — Z1231 Encounter for screening mammogram for malignant neoplasm of breast: Secondary | ICD-10-CM

## 2019-06-29 ENCOUNTER — Encounter: Payer: Self-pay | Admitting: Obstetrics & Gynecology

## 2019-06-29 ENCOUNTER — Ambulatory Visit: Payer: Medicare Other | Admitting: Obstetrics & Gynecology

## 2019-06-29 ENCOUNTER — Telehealth: Payer: Self-pay | Admitting: Obstetrics & Gynecology

## 2019-06-29 ENCOUNTER — Other Ambulatory Visit: Payer: Self-pay

## 2019-06-29 VITALS — BP 136/88 | HR 80 | Temp 97.2°F | Ht 65.5 in

## 2019-06-29 DIAGNOSIS — R234 Changes in skin texture: Secondary | ICD-10-CM | POA: Diagnosis not present

## 2019-06-29 NOTE — Telephone Encounter (Signed)
Patient with spot on underside of left breast which is discolored and itches. Patient requesting appointment. Made appointment with Dr.Miller for today at 2:30pm.

## 2019-06-29 NOTE — Telephone Encounter (Signed)
Patient has a spot on her left breast she is concerned with.

## 2019-06-29 NOTE — Progress Notes (Signed)
GYNECOLOGY  VISIT  CC:   Left breast spot, itching - noticed few days ago   HPI: 68 y.o. G2P2 Married White or Caucasian female here for evaluation of an area on her breast that is erythematous and has been itching for several weeks.  She thought it might be an insect bite.  It is on the underside of her left breast.  She is a knowledgeable individual and is aware of inflammatory breast cancer.  She has come some concerns about this.  She would like the area biopsied if possible.  Last mammogram was May 19, 2018 and she does have one scheduled for August 02, 2019.  She denies any nipple discharge or discrete mass.    GYNECOLOGIC HISTORY: Patient's last menstrual period was 12/14/2004. Contraception: PMP Menopausal hormone therapy: none  Patient Active Problem List   Diagnosis Date Noted  . Extrinsic asthma without complication 99/24/2683  . Atypical chest pain 04/30/2017  . Chest pressure 04/07/2017  . Palpitations 04/07/2017  . Headache 09/06/2015  . Hyponatremia 08/14/2015  . Hypo-osmolality and hyponatremia 08/14/2015  . Lichen simplex chronicus 06/29/2015  . Shortness of breath on exertion 04/23/2015  . Dyspnea 04/23/2015  . Upper airway cough syndrome 11/30/2013  . Intermittent asthma without complication 41/96/2229  . Chronic cough 01/23/2013  . Bronchiectasis without complication ([place]) 79/89/2119  . MAI (mycobacterium avium-intracellulare) ([place]) 12/19/2012  . Pulmonary mycobacterial infection ([place]) 12/19/2012    Past Medical History:  Diagnosis Date  . Asthma   . GERD (gastroesophageal reflux disease)   . History of anemia after childbirth  . IBS (irritable bowel syndrome)   . Mycobacterium avium-intracellulare complex ([place])   . Osteopenia   . Pulmonary infiltrate   . Seasonal allergies     Past Surgical History:  Procedure Laterality Date  . BREAST EXCISIONAL BIOPSY    . BREAST SURGERY  2/07   left breast  . CARPAL TUNNEL RELEASE  2007      . VIDEO  BRONCHOSCOPY  12/28/2012   Procedure: VIDEO BRONCHOSCOPY WITHOUT FLUORO;  Surgeon: Chesley Mires, MD;  Location: [facility];  Service: Cardiopulmonary;  Laterality: Bilateral;    MEDS:   Current Outpatient Medications on File Prior to Visit  Medication Sig Dispense Refill  . albuterol (PROAIR HFA) 108 (90 Base) MCG/ACT inhaler Inhale 2 puffs into the lungs every 6 (six) hours as needed for wheezing or shortness of breath. 8.5 Inhaler 3  . cholecalciferol (VITAMIN D) 1000 units tablet Take 2,000 Units by mouth daily.     . diphenhydrAMINE (BENADRYL) 25 MG tablet Take 25 mg by mouth every 6 (six) hours as needed for allergies.    . famotidine (PEPCID) 20 MG tablet Take 20 mg by mouth 2 (two) times daily as needed for heartburn or indigestion.    . fexofenadine (ALLEGRA) 180 MG tablet Take 180 mg by mouth daily as needed for allergies.    . Flaxseed, Linseed, (FLAXSEED OIL) 1000 MG CAPS Take 1,000 mg by mouth daily.     Marland Kitchen levothyroxine (SYNTHROID) 25 MCG tablet Take 1 tablet by mouth daily.    Marland Kitchen loratadine (CLARITIN) 10 MG tablet Take 10 mg by mouth daily.    . Magnesium 500 MG TABS Take 500 mg by mouth at bedtime.    . vitamin C (ASCORBIC ACID) 500 MG tablet Take 500 mg by mouth daily.     No current facility-administered medications on file prior to visit.     ALLERGIES: Accolate [zafirlukast], Bacitracin, Cefdinir, Doxycycline, Erythromycin, Etodolac, Moxifloxacin,  Neosporin [neomycin-bacitracin zn-polymyx], Polysporin [bacitracin-polymyxin b], and Penicillins  Family History  Problem Relation Age of Onset  . Lymphoma Mother   . Breast cancer Sister   . Colon cancer Maternal Aunt   . Heart disease Other        maternal grandparents  . Diabetes Paternal Grandmother   . Osteoporosis Paternal Grandmother   . Hypertension Father   . Bipolar disorder Sister   . Bipolar disorder Maternal Grandmother     SH:  Married, non smoker  Review of Systems  Skin:       Itchy, erythematous  breast lesion  All other systems reviewed and are negative.   PHYSICAL EXAMINATION:    BP 136/88   Pulse 80   Temp (!) 97.2 F (36.2 C) (Temporal)   Ht 5' 5.5" (1.664 m)   LMP 12/14/2004   BMI 22.32 kg/m     Physical Exam  Constitutional: She is oriented to person, place, and time. She appears well-developed and well-nourished.  Neck: Normal range of motion. Neck supple. No thyromegaly present.  Respiratory:    Neurological: She is alert and oriented to person, place, and time.  Skin: Skin is warm and dry.  Psychiatric: She has a normal mood and affect.   Procedure:  Area cleansed with Betadine.  Sterile technique used throughout procedure.  Skin anesthestized with Lidocaine 1% plain; 1.6mL.  3 punch biopsy used to obtain specimen.  Biopsy grasped with pick-ups and excised with scissors.  Adequate hemostasis obtained with silver nitrate sticks.  Dressing was applied.  Pt tolerated procedure well.  Chaperone was present for exam.  Assessment: Erythematous breast skin lesion  Plan: Biopsy pending.  Results and additional recommendations will be made once pathology is returned.

## 2019-08-02 ENCOUNTER — Other Ambulatory Visit: Payer: Self-pay

## 2019-08-02 ENCOUNTER — Ambulatory Visit
Admission: RE | Admit: 2019-08-02 | Discharge: 2019-08-02 | Disposition: A | Discharge status: Home / Self Care | Payer: Medicare Other | Source: Ambulatory Visit | Attending: Obstetrics & Gynecology | Admitting: Obstetrics & Gynecology

## 2019-08-02 DIAGNOSIS — Z1231 Encounter for screening mammogram for malignant neoplasm of breast: Secondary | ICD-10-CM

## 2019-09-15 ENCOUNTER — Other Ambulatory Visit: Payer: Self-pay

## 2019-09-19 ENCOUNTER — Other Ambulatory Visit: Payer: Self-pay

## 2019-09-19 ENCOUNTER — Encounter: Payer: Self-pay | Admitting: Obstetrics & Gynecology

## 2019-09-19 ENCOUNTER — Ambulatory Visit (INDEPENDENT_AMBULATORY_CARE_PROVIDER_SITE_OTHER): Payer: Medicare Other | Admitting: Obstetrics & Gynecology

## 2019-09-19 VITALS — BP 116/70 | HR 84 | Temp 97.6°F | Ht 64.0 in | Wt 139.8 lb

## 2019-09-19 DIAGNOSIS — M8580 Other specified disorders of bone density and structure, unspecified site: Secondary | ICD-10-CM | POA: Diagnosis not present

## 2019-09-19 DIAGNOSIS — E871 Hypo-osmolality and hyponatremia: Secondary | ICD-10-CM

## 2019-09-19 DIAGNOSIS — N898 Other specified noninflammatory disorders of vagina: Secondary | ICD-10-CM

## 2019-09-19 DIAGNOSIS — Z01419 Encounter for gynecological examination (general) (routine) without abnormal findings: Secondary | ICD-10-CM | POA: Diagnosis not present

## 2019-09-19 DIAGNOSIS — Z124 Encounter for screening for malignant neoplasm of cervix: Secondary | ICD-10-CM

## 2019-09-19 DIAGNOSIS — E7800 Pure hypercholesterolemia, unspecified: Secondary | ICD-10-CM

## 2019-09-19 MED ORDER — ESTRADIOL 0.1 MG/GM VA CREA: 1.0000 | TOPICAL_CREAM | VAGINAL | 1 refills | Status: AC

## 2019-09-19 NOTE — Progress Notes (Addendum)
68 y.o. G2P2 Married White or Caucasian female here for annual exam.  Denies vaginal bleeding.    Declines flu shot today.  She is in the [place] trial.  She's had the second in the series.  Can get the flu on October 9th.  She is planing on having this when she can.    Had vaginal itching a few weeks ago.  She treated with OTC yeast treatment.  Still having a little itching.  Not sure if this atrophy or still yeast.    Patient's last menstrual period was 12/14/2004.          Sexually active: Yes.    The current method of family planning is post menopausal status.    Exercising: Yes.    walking, weights , garden  Smoker:  no  Health Maintenance: Pap:  04/13/17 Neg  History of abnormal Pap:  no MMG:  08/02/19 BIRADS1:neg Colonoscopy:  2012 normal BMD:   2010, declines treatment so declines repeating TDaP:  2013 Pneumonia vaccine(s):  2019 Shingrix:   Completed  Flu vaccine: contraindicated until 10/9 Hep C testing: done  Screening Labs: here today    reports that she quit smoking about 37 years ago. Her smoking use included cigarettes. She has a 14.00 pack-year smoking history. She has never used smokeless tobacco. She reports current alcohol use of about 7.0 - 14.0 standard drinks of alcohol per week. She reports that she does not use drugs.  Past Medical History:  Diagnosis Date  . Asthma   . GERD (gastroesophageal reflux disease)   . History of anemia after childbirth  . IBS (irritable bowel syndrome)   . Mycobacterium avium-intracellulare complex ([place])   . Osteopenia   . Pulmonary infiltrate   . Seasonal allergies     Past Surgical History:  Procedure Laterality Date  . BREAST EXCISIONAL BIOPSY    . BREAST SURGERY  2/07   left breast  . CARPAL TUNNEL RELEASE  2007      . VIDEO BRONCHOSCOPY  12/28/2012   Procedure: VIDEO BRONCHOSCOPY WITHOUT FLUORO;  Surgeon: Chesley Mires, MD;  Location: [facility];  Service: Cardiopulmonary;  Laterality: Bilateral;    Current  Outpatient Medications  Medication Sig Dispense Refill  . albuterol (PROAIR HFA) 108 (90 Base) MCG/ACT inhaler Inhale 2 puffs into the lungs every 6 (six) hours as needed for wheezing or shortness of breath. 8.5 Inhaler 3  . cholecalciferol (VITAMIN D) 1000 units tablet Take 2,000 Units by mouth daily.     . Cranberry 200 MG CAPS Take by mouth.    . diphenhydrAMINE (BENADRYL) 25 MG tablet Take 25 mg by mouth every 6 (six) hours as needed for allergies.    Marland Kitchen estradiol (ESTRACE) 0.1 MG/GM vaginal cream Place 1 Applicatorful vaginally 3 (three) times a week.    . famotidine (PEPCID) 20 MG tablet Take 20 mg by mouth 2 (two) times daily as needed for heartburn or indigestion.    . fexofenadine (ALLEGRA) 180 MG tablet Take 180 mg by mouth daily as needed for allergies.    . Flaxseed, Linseed, (FLAXSEED OIL) 1000 MG CAPS Take 1,000 mg by mouth daily.     Marland Kitchen levothyroxine (SYNTHROID) 25 MCG tablet Take 1 tablet by mouth daily.    Marland Kitchen loratadine (CLARITIN) 10 MG tablet Take 10 mg by mouth daily.    . Magnesium 500 MG TABS Take 500 mg by mouth at bedtime.    . SYMBICORT 80-4.5 MCG/ACT inhaler INHALE 2 PUFFS INTO THE LUNGS 2 (TWO)  TIMES DAILY AS NEEDED.    Marland Kitchen vitamin C (ASCORBIC ACID) 500 MG tablet Take 500 mg by mouth daily.     No current facility-administered medications for this visit.     Family History  Problem Relation Age of Onset  . Lymphoma Mother   . Breast cancer Sister   . Colon cancer Maternal Aunt   . Heart disease Other        maternal grandparents  . Diabetes Paternal Grandmother   . Osteoporosis Paternal Grandmother   . Hypertension Father   . Bipolar disorder Sister   . Bipolar disorder Maternal Grandmother     Review of Systems  Genitourinary:       Itching   All other systems reviewed and are negative.   Exam:   BP 116/70   Pulse 84   Temp 97.6 F (36.4 C) (Temporal)   Ht 5\' 4"  (1.626 m)   Wt 139 lb 12.8 oz (63.4 kg)   LMP 12/14/2004   BMI 24.00 kg/m   Height:    Height: 5\' 4"  (162.6 cm)  Ht Readings from Last 3 Encounters:  09/19/19 5\' 4"  (1.626 m)  06/29/19 5' 5.5" (1.664 m)  01/31/19 5' 5.5" (1.664 m)    General appearance: alert, cooperative and appears stated age Head: Normocephalic, without obvious abnormality, atraumatic Neck: no adenopathy, supple, symmetrical, trachea midline and thyroid normal to inspection and palpation Lungs: clear to auscultation bilaterally Breasts: normal appearance, no masses or tenderness Heart: regular rate and rhythm Abdomen: soft, non-tender; bowel sounds normal; no masses,  no organomegaly Extremities: extremities normal, atraumatic, no cyanosis or edema Skin: Skin color, texture, turgor normal. No rashes or lesions Lymph nodes: Cervical, supraclavicular, and axillary nodes normal. No abnormal inguinal nodes palpated Neurologic: Grossly normal   Pelvic: External genitalia:  no lesions              Urethra:  normal appearing urethra with no masses, tenderness or lesions              Bartholins and Skenes: normal                 Vagina: normal appearing vagina with normal color and discharge, no lesions              Cervix: no lesions              Pap taken: Yes.   Bimanual Exam:  Uterus:  normal size, contour, position, consistency, mobility, non-tender              Adnexa: normal adnexa and no mass, fullness, tenderness               Rectovaginal: Confirms               Anus:  normal sphincter tone, no lesions  Chaperone was present for exam.  A:  Well Woman with normal exam PMP, no HRT Vaginal itching H/o mycobacterium avium.  Followed at [facility] by Dr. Dorothyann Peng Osteopenia participant in [place] vaccination trial  P:   Mammogram guidelines reviewed.  She is doing yearly Pap guidelines reviewed with pt.  She desires a pap smear this year despite guideline recommendations.  Obtained. Declines BMD due to radiation exposure CBC, CMP, Lipids, Vit D obtained today Vaginitis testing obtained  today RF for estrace vaginal cream to pharmacy with RF Will get flu shot when advised from vaccine trial standpoint Return annually or prn

## 2019-09-20 ENCOUNTER — Other Ambulatory Visit (HOSPITAL_COMMUNITY)
Admission: RE | Admit: 2019-09-20 | Discharge: 2019-09-20 | Disposition: A | Discharge status: Home / Self Care | Payer: Medicare Other | Source: Ambulatory Visit | Attending: Obstetrics & Gynecology | Admitting: Obstetrics & Gynecology

## 2019-09-20 DIAGNOSIS — Z124 Encounter for screening for malignant neoplasm of cervix: Secondary | ICD-10-CM | POA: Insufficient documentation

## 2019-09-20 LAB — COMPREHENSIVE METABOLIC PANEL
ALT: 16 IU/L (ref 0–32)
AST: 21 IU/L (ref 0–40)
Albumin/Globulin Ratio: 1.9 (ref 1.2–2.2)
Albumin: 4.5 g/dL (ref 3.8–4.8)
Alkaline Phosphatase: 87 IU/L (ref 39–117)
BUN/Creatinine Ratio: 12 (ref 12–28)
BUN: 8 mg/dL (ref 8–27)
Bilirubin Total: 0.5 mg/dL (ref 0.0–1.2)
CO2: 25 mmol/L (ref 20–29)
Calcium: 9.1 mg/dL (ref 8.7–10.3)
Chloride: 94 mmol/L — ABNORMAL LOW (ref 96–106)
Creatinine, Ser: 0.68 mg/dL (ref 0.57–1.00)
GFR calc Af Amer: 105 mL/min/{1.73_m2} (ref >59)
GFR calc non Af Amer: 91 mL/min/{1.73_m2} (ref >59)
Globulin, Total: 2.4 g/dL (ref 1.5–4.5)
Glucose: 81 mg/dL (ref 65–99)
Potassium: 4.8 mmol/L (ref 3.5–5.2)
Sodium: 132 mmol/L — ABNORMAL LOW (ref 134–144)
Total Protein: 6.9 g/dL (ref 6.0–8.5)

## 2019-09-20 LAB — LIPID PANEL
Chol/HDL Ratio: 3.1 ratio (ref 0.0–4.4)
Cholesterol, Total: 228 mg/dL — ABNORMAL HIGH (ref 100–199)
HDL: 73 mg/dL (ref >39)
LDL Chol Calc (NIH): 131 mg/dL — ABNORMAL HIGH (ref 0–99)
Triglycerides: 140 mg/dL (ref 0–149)
VLDL Cholesterol Cal: 24 mg/dL (ref 5–40)

## 2019-09-20 LAB — VAGINITIS/VAGINOSIS, DNA PROBE
Candida Species: NEGATIVE
Gardnerella vaginalis: NEGATIVE
Trichomonas vaginosis: NEGATIVE

## 2019-09-20 LAB — VITAMIN D 25 HYDROXY (VIT D DEFICIENCY, FRACTURES): Vit D, 25-Hydroxy: 35.1 ng/mL (ref 30.0–100.0)

## 2019-09-20 LAB — CBC
Hematocrit: 37.9 % (ref 34.0–46.6)
Hemoglobin: 13.6 g/dL (ref 11.1–15.9)
MCH: 33 pg (ref 26.6–33.0)
MCHC: 35.9 g/dL — ABNORMAL HIGH (ref 31.5–35.7)
MCV: 92 fL (ref 79–97)
Platelets: 275 10*3/uL (ref 150–450)
RBC: 4.12 x10E6/uL (ref 3.77–5.28)
RDW: 11.8 % (ref 11.7–15.4)
WBC: 7.2 10*3/uL (ref 3.4–10.8)

## 2019-09-20 NOTE — Addendum Note (Signed)
Addended by: Megan Salon on: 09/20/2019 02:07 PM   Modules accepted: Orders

## 2019-09-25 LAB — CYTOLOGY - PAP
Adequacy: ABSENT
Diagnosis: NEGATIVE

## 2020-02-23 ENCOUNTER — Ambulatory Visit: Payer: Medicare Other | Admitting: Pulmonary Disease

## 2020-02-23 ENCOUNTER — Other Ambulatory Visit: Payer: Self-pay

## 2020-02-23 ENCOUNTER — Encounter: Payer: Self-pay | Admitting: Pulmonary Disease

## 2020-02-23 VITALS — BP 125/80 | HR 84 | Temp 97.0°F | Ht 64.0 in | Wt 140.6 lb

## 2020-02-23 DIAGNOSIS — J3089 Other allergic rhinitis: Secondary | ICD-10-CM

## 2020-02-23 DIAGNOSIS — J479 Bronchiectasis, uncomplicated: Secondary | ICD-10-CM | POA: Diagnosis not present

## 2020-02-23 DIAGNOSIS — A310 Pulmonary mycobacterial infection: Secondary | ICD-10-CM | POA: Diagnosis not present

## 2020-02-23 DIAGNOSIS — J45909 Unspecified asthma, uncomplicated: Secondary | ICD-10-CM | POA: Diagnosis not present

## 2020-02-23 NOTE — Patient Instructions (Signed)
Try using symbicort on regular basis for the next week, and then send a message about how you are doing.  Follow up in 1 year.

## 2020-02-23 NOTE — Progress Notes (Signed)
[facility]  Chief Complaint  Patient presents with  . Follow-up    Cough    Constitutional:  BP 125/80 (BP Location: Right Arm, Patient Position: Sitting, Cuff Size: Normal)   Pulse 84   Temp (!) 97 F (36.1 C)   Ht 5\' 4"  (1.626 m)   Wt 140 lb 9.6 oz (63.8 kg)   LMP 12/14/2004   SpO2 98% Comment: on room air  BMI 24.13 kg/m   Past Medical History:  GERD, IBS, Osteopenia, Hyponatremia  Brief Summary:  Kim Vasquez is a 69 y.o. female former smoker with chronic cough, allergic asthma localized BTX, and MAI.  She notices more problem with cough and chest tightness with allergies.  This happens more in the Winter months for her.  She lives in an area prone to mold.  She also has allergies to maple trees.  Her symptoms are better when she uses symbicort.  Takes claritin nightly for sinus symptoms.  Not fever, sputum, hemoptysis, weight loss, gland swelling.  Physical Exam:   Appearance - well kempt   Neck - trachea midline no elevation in JVP  Respiratory - normal appearance of chest wall, normal respiratory effort w/o accessory muscle use, no dullness on percussion, no wheezing or rales  CV - s1s2 regular rate and rhythm, no murmurs, no peripheral edema, radial pulses symmetric  Ext - no cyanosis, clubbing, or joint inflammation noted  Psych - normal mood and affect    Discussion:  It would appear most of her symptoms at present are related to allergies and asthma, rather than MAI and BTX.  Assessment/Plan:   Allergic asthma. - asked her to use symbicort bid for next week, and then update [place] on her status - if improved, then she can resume symbicort prn  Allergic rhinitis. 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 - followed by Dr. Dorothyann Peng at [facility] - if her symptoms aren't improved with more regular use of symbicort, then I  advised her to keep appointment with Dr. Dorothyann Peng in April 2021   Patient Instructions  Try using symbicort on regular basis for the next week, and then send a message about how you are doing.  Follow up in 1 year.   Time spent 24 minutes  Chesley Mires, MD [facility] Pager: [phone] 02/23/2020, 10:17 AM  Flow Sheet    Pulmonary tests:  Bronchoscopy 05/29/08 >> scarring RML, cytology negative; MAI, Penicillium, and Aspergillus [place] in BAL Bronchoscopy 12/28/12 >> MAI in BAL, cytology negative PFT 03/01/13 >> FEV1 2.92 (136%), FEV1% 85, TLC 5.91 (124%), DLCO 117%, borderline BD Spirometry 02/15/17 >> FEV1 2.60 (102%), FEV1% 80 FeNO 04/08/17 >> 22 RAST 06/08/17 >> negative, IgE 49 FeNO 01/16/19 >> 64 Spirometry 01/16/19 >> FEV1 2.5 (105%), FEV1% 80  Cardiac tests:  CT chest 02/08/08 >> RML tree in bud, mild BTX RML CT chest 12/22/12 >> RUL and RML tree in bud, BTX RML CT chest 04/23/15 >> BTX RUL/RML, nodular infiltrates RLL/RML increased in size CT chest 11/23/16 >> cylindrical BTX RUL/RML, scattered nodularity no change  Medications:   Allergies as of 02/23/2020      Reactions   Accolate [zafirlukast]    Heart started skipping beats   Bacitracin Itching, Other (See Comments)   Reaction:  Numbness    Cefdinir Itching   Itching eyes, hot rash all over body   Doxycycline Other (See Comments)  Reaction:  Stiff neck and headache    Erythromycin Other (See Comments)   Reaction:  Decreased urine output   Etodolac Other (See Comments)   Reaction:  Dizziness    Levofloxacin Other (See Comments)   Sore throat   Moxifloxacin Other (See Comments)   Reaction:  Stiff neck, dizziness, and headache   Neosporin [neomycin-bacitracin Zn-polymyx] Itching   Polysporin [bacitracin-polymyxin B] Itching   Penicillins Itching, Rash, Other (See Comments)   Has patient had a PCN reaction causing immediate rash, facial/tongue/throat swelling, SOB or lightheadedness with  hypotension: No Has patient had a PCN reaction causing severe rash involving mucus membranes or skin necrosis: No Has patient had a PCN reaction that required hospitalization No Has patient had a PCN reaction occurring within the last 10 years: No If all of the above answers are "NO", then may proceed with Cephalosporin use.      Medication List       Accurate as of February 23, 2020 10:17 AM. If you have any questions, ask your nurse or doctor.        albuterol 108 (90 Base) MCG/ACT inhaler Commonly known as: ProAir HFA Inhale 2 puffs into the lungs every 6 (six) hours as needed for wheezing or shortness of breath.   cholecalciferol 1000 units tablet Commonly known as: VITAMIN D Take 2,000 Units by mouth daily.   Cranberry 200 MG Caps Take by mouth.   diphenhydrAMINE 25 MG tablet Commonly known as: BENADRYL Take 25 mg by mouth every 6 (six) hours as needed for allergies.   estradiol 0.1 MG/GM vaginal cream Commonly known as: ESTRACE Place 1 Applicatorful vaginally 3 (three) times a week.   famotidine 20 MG tablet Commonly known as: PEPCID Take 20 mg by mouth daily as needed for heartburn or indigestion.   fexofenadine 180 MG tablet Commonly known as: ALLEGRA Take 180 mg by mouth daily as needed for allergies.   Flaxseed Oil 1000 MG Caps Take 1,000 mg by mouth daily.   levothyroxine 25 MCG tablet Commonly known as: SYNTHROID Take 1 tablet by mouth daily.   loratadine 10 MG tablet Commonly known as: CLARITIN Take 10 mg by mouth daily.   MAGNESIUM PO Take 750 mg by mouth at bedtime.   Symbicort 80-4.5 MCG/ACT inhaler Generic drug: budesonide-formoterol INHALE 2 PUFFS INTO THE LUNGS 2 (TWO) TIMES DAILY AS NEEDED.   vitamin C 500 MG tablet Commonly known as: ASCORBIC ACID Take 500 mg by mouth daily.       Past Surgical History:  She  has a past surgical history that includes Carpal tunnel release (2007); Video bronchoscopy (12/28/2012); Breast surgery  (2/07); and Breast excisional biopsy.  Family History:  Her family history includes Bipolar disorder in her maternal grandmother and sister; Breast cancer in her sister; Colon cancer in her maternal aunt; Diabetes in her paternal grandmother; Heart disease in an other family member; Hypertension in her father; Lymphoma in her mother; Osteoporosis in her paternal grandmother.  Social History:  She  reports that she quit smoking about 38 years ago. Her smoking use included cigarettes. She has a 14.00 pack-year smoking history. She has never used smokeless tobacco. She reports current alcohol use of about 7.0 - 14.0 standard drinks of alcohol per week. She reports that she does not use drugs.

## 2020-03-05 ENCOUNTER — Encounter: Payer: Self-pay | Admitting: Certified Nurse Midwife

## 2020-03-13 ENCOUNTER — Other Ambulatory Visit: Payer: Self-pay | Admitting: Family Medicine

## 2020-03-13 DIAGNOSIS — E2839 Other primary ovarian failure: Secondary | ICD-10-CM

## 2020-04-04 ENCOUNTER — Encounter: Payer: Self-pay | Admitting: Obstetrics & Gynecology

## 2020-04-04 ENCOUNTER — Telehealth: Payer: Self-pay | Admitting: *Deleted

## 2020-04-04 ENCOUNTER — Other Ambulatory Visit: Payer: Self-pay

## 2020-04-04 ENCOUNTER — Ambulatory Visit: Payer: Medicare PPO | Admitting: Obstetrics & Gynecology

## 2020-04-04 VITALS — BP 118/80 | Temp 98.1°F | Wt 142.0 lb

## 2020-04-04 DIAGNOSIS — N898 Other specified noninflammatory disorders of vagina: Secondary | ICD-10-CM

## 2020-04-04 MED ORDER — TERCONAZOLE 0.4 % VA CREA: 1.0000 | TOPICAL_CREAM | Freq: Every day | VAGINAL | 0 refills | Status: DC

## 2020-04-04 NOTE — Telephone Encounter (Signed)
Patient has a vaginal infection.

## 2020-04-04 NOTE — Progress Notes (Signed)
GYNECOLOGY  VISIT  CC:   Vaginal itching  HPI: 69 y.o. G2P2 Married White or Caucasian female here for vaginal itching. Reports this started a few weeks ago.  She tried using boric acid.  She is having a little sticky discharge.  Denies vaginal odor or vaginal bleeding.  At night, she has more itching.  When she used the boric acid, she also had some burning.  It is most noticeable at night.  Denies urinary symptoms.  She does use vaginal estrogen cream once weekly.    GYNECOLOGIC HISTORY: Patient's last menstrual period was 12/14/2004. Contraception: husband vasectomy Menopausal hormone therapy: estrace cream  Patient Active Problem List   Diagnosis Date Noted  . Extrinsic asthma without complication [id number]  . Atypical chest pain 04/30/2017  . Chest pressure 04/07/2017  . Palpitations 04/07/2017  . Headache 09/06/2015  . Hyponatremia 08/14/2015  . Hypo-osmolality and hyponatremia 08/14/2015  . Lichen simplex chronicus 06/29/2015  . Shortness of breath on exertion 04/23/2015  . Dyspnea 04/23/2015  . Upper airway cough syndrome 11/30/2013  . Intermittent asthma without complication [id number]  . Chronic cough 01/23/2013  . Bronchiectasis without complication ([place]) [id number]  . MAI (mycobacterium avium-intracellulare) ([place]) 12/19/2012  . Pulmonary mycobacterial infection ([place]) 12/19/2012    Past Medical History:  Diagnosis Date  . Asthma   . GERD (gastroesophageal reflux disease)   . History of anemia after childbirth  . IBS (irritable bowel syndrome)   . Mycobacterium avium-intracellulare complex ([place])   . Osteopenia   . Pulmonary infiltrate   . Seasonal allergies     Past Surgical History:  Procedure Laterality Date  . BREAST EXCISIONAL BIOPSY    . BREAST SURGERY  2/07   left breast  . CARPAL TUNNEL RELEASE  2007      . VIDEO BRONCHOSCOPY  12/28/2012   Procedure: VIDEO BRONCHOSCOPY WITHOUT FLUORO;  Surgeon: Chesley Mires, MD;  Location: [facility];  Service:  Cardiopulmonary;  Laterality: Bilateral;    MEDS:   Current Outpatient Medications on File Prior to Visit  Medication Sig Dispense Refill  . albuterol (PROAIR HFA) 108 (90 Base) MCG/ACT inhaler Inhale 2 puffs into the lungs every 6 (six) hours as needed for wheezing or shortness of breath. 8.5 Inhaler 3  . cholecalciferol (VITAMIN D) 1000 units tablet Take 2,000 Units by mouth daily.     Marland Kitchen CITRULLINE PO Take by mouth. + argenine    . Cranberry 200 MG CAPS Take by mouth.    . diphenhydrAMINE (BENADRYL) 25 MG tablet Take 25 mg by mouth every 6 (six) hours as needed for allergies.    Marland Kitchen EPINEPHrine 0.3 mg/0.3 mL IJ SOAJ injection     . estradiol (ESTRACE) 0.1 MG/GM vaginal cream Place 1 Applicatorful vaginally 3 (three) times a week. 42.5 g 1  . famotidine (PEPCID) 20 MG tablet Take 20 mg by mouth daily as needed for heartburn or indigestion.     . fexofenadine (ALLEGRA) 180 MG tablet Take 180 mg by mouth daily as needed for allergies.    . Flaxseed, Linseed, (FLAXSEED OIL) 1000 MG CAPS Take 1,000 mg by mouth daily.     Marland Kitchen levothyroxine (SYNTHROID) 50 MCG tablet     . loratadine (CLARITIN) 10 MG tablet Take 10 mg by mouth daily.    Marland Kitchen MAGNESIUM PO Take 750 mg by mouth at bedtime.     . Red Yeast Rice Extract (RED YEAST RICE PO) Take by mouth.    . SYMBICORT 80-4.5 MCG/ACT inhaler INHALE 2 PUFFS  INTO THE LUNGS 2 (TWO) TIMES DAILY AS NEEDED.    Marland Kitchen vitamin C (ASCORBIC ACID) 500 MG tablet Take 500 mg by mouth daily.     No current facility-administered medications on file prior to visit.    ALLERGIES: Accolate [zafirlukast], Bacitracin, Cefdinir, Doxycycline, Erythromycin, Etodolac, Levofloxacin, Moxifloxacin, Neosporin [neomycin-bacitracin zn-polymyx], Polysporin [bacitracin-polymyxin b], and Penicillins  Family History  Problem Relation Age of Onset  . Lymphoma Mother   . Breast cancer Sister   . Colon cancer Maternal Aunt   . Heart disease Other        maternal grandparents  . Diabetes  Paternal Grandmother   . Osteoporosis Paternal Grandmother   . Hypertension Father   . Bipolar disorder Sister   . Bipolar disorder Maternal Grandmother     SH:  Married, non smoker  Review of Systems  Genitourinary: Positive for vaginal discharge.       Vaginal itching & irritation    PHYSICAL EXAMINATION:    BP 118/80   Temp 98.1 F (36.7 C) (Skin)   Wt 142 lb (64.4 kg)   LMP 12/14/2004   BMI 24.37 kg/m     General appearance: alert, cooperative and appears stated age Lymph:  no inguinal LAD noted  Pelvic: External genitalia:  no lesions              Urethra:  normal appearing urethra with no masses, tenderness or lesions              Bartholins and Skenes: normal                 Vagina: normal appearing vagina with whitish discharge noted, no lesions              Cervix: no lesions              Bimanual Exam:  Uterus:  normal size, contour, position, consistency, mobility, non-tender              Adnexa: no mass, fullness, tenderness  Chaperone, Dorethea Clan, CMA, was present for exam.  Assessment: Vaginal itching Vaginal discharge  Plan: Terazol 7 nightly x 7 nights to pharmacy. Affirm pending from today

## 2020-04-04 NOTE — Telephone Encounter (Signed)
Spoke with patient. Patient reports external vaginal itching that wakes her from sleep at night. Sticky vaginal d/c, no odor. Symptoms started 1.5 wks ago, has tried OTC medication for yeast, no change in symptoms. Denies vaginal bleeding or pain. [id number] prescreen negative, precautions reviewed. OV scheduled for today at 3pm with Dr. Sabra Heck.   Last AEX 09/19/19  Routing to provider for final review. Patient is agreeable to disposition. Will close encounter.

## 2020-04-05 LAB — VAGINITIS/VAGINOSIS, DNA PROBE
Candida Species: NEGATIVE
Gardnerella vaginalis: NEGATIVE
Trichomonas vaginosis: NEGATIVE

## 2020-04-26 ENCOUNTER — Other Ambulatory Visit: Payer: Self-pay

## 2020-04-26 DIAGNOSIS — J45909 Unspecified asthma, uncomplicated: Secondary | ICD-10-CM

## 2020-04-26 MED ORDER — ALBUTEROL SULFATE HFA 108 (90 BASE) MCG/ACT IN AERS: 2.0000 | INHALATION_SPRAY | Freq: Four times a day (QID) | RESPIRATORY_TRACT | 6 refills | Status: AC | PRN

## 2020-05-21 ENCOUNTER — Other Ambulatory Visit: Payer: Self-pay

## 2020-05-21 ENCOUNTER — Ambulatory Visit
Admission: RE | Admit: 2020-05-21 | Discharge: 2020-05-21 | Disposition: A | Discharge status: Home / Self Care | Payer: Medicare PPO | Source: Ambulatory Visit | Attending: Family Medicine | Admitting: Family Medicine

## 2020-05-21 DIAGNOSIS — E2839 Other primary ovarian failure: Secondary | ICD-10-CM

## 2020-05-21 DIAGNOSIS — Z780 Asymptomatic menopausal state: Secondary | ICD-10-CM | POA: Diagnosis not present

## 2020-05-21 DIAGNOSIS — M810 Age-related osteoporosis without current pathological fracture: Secondary | ICD-10-CM | POA: Diagnosis not present

## 2020-05-21 DIAGNOSIS — M85851 Other specified disorders of bone density and structure, right thigh: Secondary | ICD-10-CM | POA: Diagnosis not present

## 2020-05-23 DIAGNOSIS — Z03818 Encounter for observation for suspected exposure to other biological agents ruled out: Secondary | ICD-10-CM | POA: Diagnosis not present

## 2020-05-28 DIAGNOSIS — Z8371 Family history of colonic polyps: Secondary | ICD-10-CM | POA: Diagnosis not present

## 2020-05-28 DIAGNOSIS — D120 Benign neoplasm of cecum: Secondary | ICD-10-CM | POA: Diagnosis not present

## 2020-05-28 DIAGNOSIS — D122 Benign neoplasm of ascending colon: Secondary | ICD-10-CM | POA: Diagnosis not present

## 2020-05-28 DIAGNOSIS — Z1211 Encounter for screening for malignant neoplasm of colon: Secondary | ICD-10-CM | POA: Diagnosis not present

## 2020-05-31 DIAGNOSIS — D122 Benign neoplasm of ascending colon: Secondary | ICD-10-CM | POA: Diagnosis not present

## 2020-05-31 DIAGNOSIS — D120 Benign neoplasm of cecum: Secondary | ICD-10-CM | POA: Diagnosis not present

## 2020-06-07 DIAGNOSIS — M810 Age-related osteoporosis without current pathological fracture: Secondary | ICD-10-CM | POA: Diagnosis not present

## 2020-06-11 ENCOUNTER — Ambulatory Visit: Payer: Medicare PPO | Admitting: Obstetrics & Gynecology

## 2020-06-20 NOTE — Progress Notes (Signed)
GYNECOLOGY  VISIT  CC:   Follow up for vaginal itching, breast lesion  HPI: 69 y.o. G2P2 Married White or Caucasian female here for 64mth follow up of vaginal itching.  She is using the vaginal estrogen cream twice weekly.  She reports when she uses this regularly, it does help.  Denies vaginal bleeding.  Vaginitis testing has been done and was negative.  No STD concerns.  Noticed a small pimple on her breast about three days ago.  It is now a little itchy and has a little scab.  She'd like me look at this today.  She and her husband have moved to [facility] since I saw her last.  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  They initially planned to rent for a year.  Ended up finding a home in the same [employer].  Will plan to transfer care this year.    GYNECOLOGIC HISTORY: Patient's last menstrual period was 12/14/2004. Contraception: husband vasectomy Menopausal hormone therapy: estrace vaginal cream twice weekly  Patient Active Problem List   Diagnosis Date Noted  . Extrinsic asthma without complication 05/39/7673  . Chronic pansinusitis 10/24/2018  . Atypical chest pain 04/30/2017  . Chest pressure 04/07/2017  . Palpitations 04/07/2017  . Headache 09/06/2015  . Hyponatremia 08/14/2015  . Hypo-osmolality and hyponatremia 08/14/2015  . Lichen simplex chronicus 06/29/2015  . Shortness of breath on exertion 04/23/2015  . Dyspnea 04/23/2015  . Upper airway cough syndrome 11/30/2013  . Intermittent asthma without complication 41/93/7902  . Chronic cough 01/23/2013  . Bronchiectasis without complication ([place]) 40/97/3532  . MAI (mycobacterium avium-intracellulare) ([place]) 12/19/2012  . Pulmonary mycobacterial infection ([place]) 12/19/2012    Past Medical History:  Diagnosis Date  . Asthma   . GERD (gastroesophageal reflux disease)   . History of anemia after childbirth  . IBS (irritable bowel syndrome)   . Mycobacterium avium-intracellulare complex ([place])    . Osteopenia   . Pulmonary infiltrate   . Seasonal allergies     Past Surgical History:  Procedure Laterality Date  . BREAST EXCISIONAL BIOPSY    . BREAST SURGERY  2/07   left breast  . CARPAL TUNNEL RELEASE  2007      . VIDEO BRONCHOSCOPY  12/28/2012   Procedure: VIDEO BRONCHOSCOPY WITHOUT FLUORO;  Surgeon: Chesley Mires, MD;  Location: [facility];  Service: Cardiopulmonary;  Laterality: Bilateral;    MEDS:   Current Outpatient Medications on File Prior to Visit  Medication Sig Dispense Refill  . albuterol (PROAIR HFA) 108 (90 Base) MCG/ACT inhaler Inhale 2 puffs into the lungs every 6 (six) hours as needed for wheezing or shortness of breath. 8 g 6  . alendronate (FOSAMAX) 70 MG tablet Take by mouth as directed.    Marland Kitchen CALCIUM PO Take by mouth.    . cholecalciferol (VITAMIN D) 1000 units tablet Take 2,000 Units by mouth daily.     Marland Kitchen CITRULLINE PO Take by mouth. + argenine    . Cranberry 200 MG CAPS Take by mouth.    . diphenhydrAMINE (BENADRYL) 25 MG tablet Take 25 mg by mouth every 6 (six) hours as needed for allergies.    Marland Kitchen estradiol (ESTRACE) 0.1 MG/GM vaginal cream Place 1 Applicatorful vaginally 3 (three) times a week. 42.5 g 1  . famotidine (PEPCID) 20 MG tablet Take 20 mg by mouth daily as needed for heartburn or indigestion.     . fexofenadine (ALLEGRA) 180 MG tablet Take 180 mg by mouth daily as needed for  allergies.    . Flaxseed, Linseed, (FLAXSEED OIL) 1000 MG CAPS Take 1,000 mg by mouth daily.     Marland Kitchen levothyroxine (SYNTHROID) 50 MCG tablet     . loratadine (CLARITIN) 10 MG tablet Take 10 mg by mouth daily.    Marland Kitchen MAGNESIUM PO Take 750 mg by mouth at bedtime.     . Red Yeast Rice Extract (RED YEAST RICE PO) Take by mouth.    . vitamin C (ASCORBIC ACID) 500 MG tablet Take 500 mg by mouth daily.    Marland Kitchen EPINEPHrine 0.3 mg/0.3 mL IJ SOAJ injection  (Patient not taking: Reported on 06/24/2020)    . SYMBICORT 80-4.5 MCG/ACT inhaler INHALE 2 PUFFS INTO THE LUNGS 2 (TWO) TIMES  DAILY AS NEEDED. (Patient not taking: Reported on 06/24/2020)     No current facility-administered medications on file prior to visit.    ALLERGIES: Accolate [zafirlukast], Bacitracin, Cefdinir, Doxycycline, Erythromycin, Etodolac, Levofloxacin, Moxifloxacin, Neosporin [neomycin-bacitracin zn-polymyx], Polysporin [bacitracin-polymyxin b], and Penicillins  Family History  Problem Relation Age of Onset  . Lymphoma Mother   . Breast cancer Sister   . Colon cancer Maternal Aunt   . Heart disease Other        maternal grandparents  . Diabetes Paternal Grandmother   . Osteoporosis Paternal Grandmother   . Hypertension Father   . Bipolar disorder Sister   . Bipolar disorder Maternal Grandmother     SH:  married  Review of Systems  Constitutional: Negative.   HENT: Negative.   Eyes: Negative.   Respiratory: Negative.   Cardiovascular: Negative.   Gastrointestinal: Negative.   Endocrine: Negative.   Genitourinary: Negative.   Musculoskeletal: Negative.   Skin:       Area on left breast  Allergic/Immunologic: Negative.   Neurological: Negative.   Hematological: Negative.   Psychiatric/Behavioral: Negative.     PHYSICAL EXAMINATION:    Wt 141 lb (64 kg)   LMP 12/14/2004   BMI 24.20 kg/m     General appearance: alert, cooperative and appears stated age Breasts: tiny superficial area that appears to be healing on outer left breast consistent with superficial skin lesion, no masses, nipple retraction Lymph:  no inguinal LAD noted  Pelvic: External genitalia:  no lesions              Urethra:  normal appearing urethra with no masses, tenderness or lesions              Bartholins and Skenes: normal                 Vagina: normal appearing vagina with normal color and discharge, no lesions              Cervix: no lesions              Bimanual Exam:  Uterus:  normal size, contour, position, consistency, mobility, non-tender              Adnexa: no mass, fullness,  tenderness  Chaperone, Terence Lux, CMA, was present for exam.  Assessment: Tiny and superficial breast lesion that appears to be skin issues only. Vaginal itching that is improved  Plan: Advised to monitor skin lesion.  Would need dermatology follow up if not resolved in 2-3 weeks. Continue vaginal estrogen.  D/w pt possible adding vit E vaginal cream or suppositories.  Will need to check with compounding pharmacy in [facility] about this.  If cannot be done there, will have rx [facility] as  they do ship now.  Pt aware we will need to be back in touch with her later this week.     20 minutes spent in total with pt.

## 2020-06-24 ENCOUNTER — Other Ambulatory Visit: Payer: Self-pay

## 2020-06-24 ENCOUNTER — Ambulatory Visit: Payer: Medicare PPO | Admitting: Obstetrics & Gynecology

## 2020-06-24 ENCOUNTER — Encounter: Payer: Self-pay | Admitting: Obstetrics & Gynecology

## 2020-06-24 VITALS — BP 114/68 | HR 72 | Resp 16 | Wt 141.0 lb

## 2020-06-24 DIAGNOSIS — R234 Changes in skin texture: Secondary | ICD-10-CM | POA: Diagnosis not present

## 2020-06-24 DIAGNOSIS — N898 Other specified noninflammatory disorders of vagina: Secondary | ICD-10-CM

## 2020-06-27 ENCOUNTER — Encounter: Payer: Self-pay | Admitting: Obstetrics & Gynecology

## 2020-07-04 DIAGNOSIS — C44311 Basal cell carcinoma of skin of nose: Secondary | ICD-10-CM | POA: Diagnosis not present

## 2020-07-04 DIAGNOSIS — Z85828 Personal history of other malignant neoplasm of skin: Secondary | ICD-10-CM | POA: Diagnosis not present

## 2020-07-04 DIAGNOSIS — C44319 Basal cell carcinoma of skin of other parts of face: Secondary | ICD-10-CM | POA: Diagnosis not present

## 2020-07-04 DIAGNOSIS — D485 Neoplasm of uncertain behavior of skin: Secondary | ICD-10-CM | POA: Diagnosis not present

## 2020-07-12 ENCOUNTER — Other Ambulatory Visit: Payer: Self-pay | Admitting: Obstetrics & Gynecology

## 2020-07-12 MED ORDER — NONFORMULARY OR COMPOUNDED ITEM: 3 refills | Status: DC

## 2020-07-15 ENCOUNTER — Telehealth: Payer: Self-pay | Admitting: *Deleted

## 2020-07-15 NOTE — Telephone Encounter (Signed)
Left message to call Mahamud Metts, RN at [facility] [phone].   

## 2020-07-15 NOTE — Telephone Encounter (Signed)
-----   Message from Megan Salon, MD sent at 07/12/2020 12:40 PM EDT ----- Regarding: RE: compounding pharmacy [place] Can you please call pt and let her know [facility] can do the Vit E vaginal suppositories.  Does she want [place] to fax rx?  If so, will you please fax rx I put in your desk?  Thanks. Vinnie Level ----- Message ----- From: Burnice Logan, RN Sent: 06/27/2020   3:31 PM EDT To: Megan Salon, MD Subject: RE: compounding pharmacy [place]           Dr. Sabra Heck,   I spoke with Lennette Bihari, pharmacist, confirmed this can be compounded there.  The fax is [phone].  Thanks,  Sharee Pimple ----- Message ----- From: Megan Salon, MD Sent: 06/27/2020   6:57 AM EDT To: Burnice Logan, RN Subject: compounding pharmacy [place]               [place], Can you call the  [facility] and see if they can compound vit E vaginal suppositories 200units/ml for a pt of mine who has moved to [place]?  I want to send rx there if possible.  Thanks.  [facility]  Address: [address] [place], [place] [address] Hours:  Closed  Opens 9AM Phone: [phone]

## 2020-07-15 NOTE — Telephone Encounter (Signed)
Spoke with patient. Advised per Dr. Sabra Heck. Patient asking for vaginal Vit E cream vs vaginal suppository, if possible. If no alternative, will proceed with Vit E vaginal suppository.   Advised will review with Dr. Sabra Heck and f/u with recommendations, patient agreeable.  No Rx sent.   Dr. Sabra Heck -please advise.

## 2020-07-15 NOTE — Telephone Encounter (Signed)
Patient is returning call.  °

## 2020-07-16 NOTE — Telephone Encounter (Signed)
Could you please call the compounding pharmacy.  If they can do the cream it is Vit E vaginal cream 200u/ml, one ML vaginally/externally 2-3 times weekly.  #36ML/4RF.  Thanks.

## 2020-07-16 NOTE — Telephone Encounter (Signed)
Spoke with pharmacist, Colletta Maryland, at [facility].  Was advised Rx will be in a emollient base if used externally and vaginally. Will have 30 day expiration.    Dr. Sabra Heck -please review, ok to proceed? If so, ok to change to #5ml/11RF?

## 2020-07-17 MED ORDER — NONFORMULARY OR COMPOUNDED ITEM: 11 refills | Status: DC

## 2020-07-17 NOTE — Telephone Encounter (Signed)
Rx printed and to Dr. Sabra Heck for signature and to be faxed.   Patient notified of Rx. Patient verbalizes understanding and is agreeable.   Encounter closed.

## 2020-07-17 NOTE — Telephone Encounter (Signed)
This would be fine.  Ok to send in rx for her.  Thank you.  Please let pt know.

## 2020-07-23 DIAGNOSIS — H40012 Open angle with borderline findings, low risk, left eye: Secondary | ICD-10-CM | POA: Diagnosis not present

## 2020-07-23 DIAGNOSIS — H2513 Age-related nuclear cataract, bilateral: Secondary | ICD-10-CM | POA: Diagnosis not present

## 2020-07-23 DIAGNOSIS — H33301 Unspecified retinal break, right eye: Secondary | ICD-10-CM | POA: Diagnosis not present

## 2020-07-24 DIAGNOSIS — Z85828 Personal history of other malignant neoplasm of skin: Secondary | ICD-10-CM | POA: Diagnosis not present

## 2020-07-24 DIAGNOSIS — C44311 Basal cell carcinoma of skin of nose: Secondary | ICD-10-CM | POA: Diagnosis not present

## 2020-08-05 DIAGNOSIS — Z20822 Contact with and (suspected) exposure to covid-19: Secondary | ICD-10-CM | POA: Diagnosis not present

## 2020-09-12 DIAGNOSIS — Z7689 Persons encountering health services in other specified circumstances: Secondary | ICD-10-CM | POA: Diagnosis not present

## 2020-09-12 DIAGNOSIS — Z85828 Personal history of other malignant neoplasm of skin: Secondary | ICD-10-CM | POA: Diagnosis not present

## 2020-09-12 DIAGNOSIS — M810 Age-related osteoporosis without current pathological fracture: Secondary | ICD-10-CM | POA: Diagnosis not present

## 2020-09-12 DIAGNOSIS — E782 Mixed hyperlipidemia: Secondary | ICD-10-CM | POA: Diagnosis not present

## 2020-09-12 DIAGNOSIS — A310 Pulmonary mycobacterial infection: Secondary | ICD-10-CM | POA: Diagnosis not present

## 2020-09-12 DIAGNOSIS — E039 Hypothyroidism, unspecified: Secondary | ICD-10-CM | POA: Diagnosis not present

## 2020-09-12 DIAGNOSIS — Z23 Encounter for immunization: Secondary | ICD-10-CM | POA: Diagnosis not present

## 2020-10-02 DIAGNOSIS — Z20822 Contact with and (suspected) exposure to covid-19: Secondary | ICD-10-CM | POA: Diagnosis not present

## 2020-10-31 DIAGNOSIS — J4520 Mild intermittent asthma, uncomplicated: Secondary | ICD-10-CM | POA: Diagnosis not present

## 2020-10-31 DIAGNOSIS — R0789 Other chest pain: Secondary | ICD-10-CM | POA: Diagnosis not present

## 2020-11-11 DIAGNOSIS — Z87891 Personal history of nicotine dependence: Secondary | ICD-10-CM | POA: Diagnosis not present

## 2020-11-11 DIAGNOSIS — R0600 Dyspnea, unspecified: Secondary | ICD-10-CM | POA: Diagnosis not present

## 2020-11-11 DIAGNOSIS — R079 Chest pain, unspecified: Secondary | ICD-10-CM | POA: Diagnosis not present

## 2020-11-19 DIAGNOSIS — J4520 Mild intermittent asthma, uncomplicated: Secondary | ICD-10-CM | POA: Diagnosis not present

## 2020-11-19 DIAGNOSIS — J479 Bronchiectasis, uncomplicated: Secondary | ICD-10-CM | POA: Diagnosis not present

## 2020-11-19 DIAGNOSIS — A310 Pulmonary mycobacterial infection: Secondary | ICD-10-CM | POA: Diagnosis not present

## 2020-11-20 DIAGNOSIS — L570 Actinic keratosis: Secondary | ICD-10-CM | POA: Diagnosis not present

## 2020-11-20 DIAGNOSIS — Z85828 Personal history of other malignant neoplasm of skin: Secondary | ICD-10-CM | POA: Diagnosis not present

## 2020-11-20 DIAGNOSIS — L821 Other seborrheic keratosis: Secondary | ICD-10-CM | POA: Diagnosis not present

## 2020-11-21 DIAGNOSIS — R079 Chest pain, unspecified: Secondary | ICD-10-CM | POA: Diagnosis not present

## 2020-11-21 DIAGNOSIS — R0600 Dyspnea, unspecified: Secondary | ICD-10-CM | POA: Diagnosis not present

## 2020-11-21 DIAGNOSIS — E782 Mixed hyperlipidemia: Secondary | ICD-10-CM | POA: Diagnosis not present

## 2020-11-27 DIAGNOSIS — Z1231 Encounter for screening mammogram for malignant neoplasm of breast: Secondary | ICD-10-CM | POA: Diagnosis not present

## 2020-11-27 DIAGNOSIS — N6320 Unspecified lump in the left breast, unspecified quadrant: Secondary | ICD-10-CM | POA: Diagnosis not present

## 2020-11-27 DIAGNOSIS — N6310 Unspecified lump in the right breast, unspecified quadrant: Secondary | ICD-10-CM | POA: Diagnosis not present

## 2020-12-16 DIAGNOSIS — Z20822 Contact with and (suspected) exposure to covid-19: Secondary | ICD-10-CM | POA: Diagnosis not present

## 2020-12-16 DIAGNOSIS — Z03818 Encounter for observation for suspected exposure to other biological agents ruled out: Secondary | ICD-10-CM | POA: Diagnosis not present

## 2020-12-17 DIAGNOSIS — M810 Age-related osteoporosis without current pathological fracture: Secondary | ICD-10-CM | POA: Diagnosis not present

## 2020-12-17 DIAGNOSIS — E782 Mixed hyperlipidemia: Secondary | ICD-10-CM | POA: Diagnosis not present

## 2020-12-17 DIAGNOSIS — K219 Gastro-esophageal reflux disease without esophagitis: Secondary | ICD-10-CM | POA: Diagnosis not present

## 2020-12-17 DIAGNOSIS — F5101 Primary insomnia: Secondary | ICD-10-CM | POA: Diagnosis not present

## 2020-12-17 NOTE — Progress Notes (Signed)
70 y.o. G2P2 Married White or Caucasian female here for annual exam.     Will be getting lab work through PCP,  Retired Runner, broadcasting/film/video at [employer]  last menstrual period was 12/14/2004.          Sexually active: husband just completed prostate cancer treatment, has not had sex in a long time The current method of family planning is post menopausal status & husband vasectomy.    Exercising: Yes.    walking, light weights Smoker:  no  Health Maintenance: Pap:  04-13-17 neg 09-20-2019 neg History of abnormal Pap:  no MMG:  11-27-2020 birads 2:neg (care everywhere) Colonoscopy:  2012 normal    BMD:   05-21-2020 TDaP:  2013 Gardasil:   n/a Covid-19: moderna Hep C testing: neg per patient Screening Labs: with PCP   reports that she has quit smoking. Her smoking use included cigarettes. She has never used smokeless tobacco. She reports current alcohol use of about 7.0 - 14.0 standard drinks of alcohol per week. She reports that she does not use drugs.  Past Medical History:  Diagnosis Date  . Asthma   . GERD (gastroesophageal reflux disease)   . History of anemia after childbirth  . IBS (irritable bowel syndrome)   . Mycobacterium avium-intracellulare complex ([facility])   . Osteopenia   . Pulmonary infiltrate   . Seasonal allergies     Past Surgical History:  Procedure Laterality Date  . BREAST EXCISIONAL BIOPSY    . BREAST SURGERY  2/07   left breast  . CARPAL TUNNEL RELEASE  2007      . VIDEO BRONCHOSCOPY  12/28/2012   Procedure: VIDEO BRONCHOSCOPY WITHOUT FLUORO;  Surgeon: Coralyn Helling, MD;  Location: [facility];  Service: Cardiopulmonary;  Laterality: Bilateral;    Current Outpatient Medications  Medication Sig Dispense Refill  . albuterol (PROAIR HFA) 108 (90 Base) MCG/ACT inhaler Inhale 2 puffs into the lungs every 6 (six) hours as needed for wheezing or shortness of breath. 8 g 6  . alendronate (FOSAMAX) 70 MG tablet Take by mouth as directed.    Marland Kitchen CALCIUM PO Take  by mouth.    . cholecalciferol (VITAMIN D) 1000 units tablet Take 1,000 Units by mouth daily.    Marland Kitchen CITRULLINE PO Take by mouth. + argenine    . Cranberry 200 MG CAPS Take by mouth.    . diphenhydrAMINE (BENADRYL) 25 MG tablet Take 25 mg by mouth every 6 (six) hours as needed for allergies.    Marland Kitchen EPINEPHrine 0.3 mg/0.3 mL IJ SOAJ injection     . estradiol (ESTRACE) 0.1 MG/GM vaginal cream Place 1 Applicatorful vaginally 3 (three) times a week. 42.5 g 1  . famotidine (PEPCID) 20 MG tablet Take 20 mg by mouth daily as needed for heartburn or indigestion.     . fexofenadine (ALLEGRA) 180 MG tablet Take 180 mg by mouth daily as needed for allergies.    . Flaxseed, Linseed, (FLAXSEED OIL) 1000 MG CAPS Take 1,000 mg by mouth daily.     . Levothyroxine Sodium (SYNTHROID PO) Take 37.5 mcg by mouth.    . loratadine (CLARITIN) 10 MG tablet Take 10 mg by mouth daily.    Marland Kitchen MAGNESIUM PO Take 750 mg by mouth at bedtime.     . pravastatin (PRAVACHOL) 40 MG tablet Take 40 mg by mouth at bedtime.    . SYMBICORT 80-4.5 MCG/ACT inhaler INHALE 2 PUFFS INTO THE LUNGS 2 (TWO) TIMES DAILY AS NEEDED.    Marland Kitchen vitamin  C (ASCORBIC ACID) 500 MG tablet Take 500 mg by mouth daily.     No current facility-administered medications for this visit.    Family History  Problem Relation Age of Onset  . Lymphoma Mother   . Breast cancer Sister   . Colon cancer Maternal Aunt   . Heart disease Other        maternal grandparents  . Diabetes Paternal Grandmother   . Osteoporosis Paternal Grandmother   . Hypertension Father   . Bipolar disorder Sister   . Bipolar disorder Maternal Grandmother     Review of Systems  Constitutional: Negative.   HENT: Negative.   Eyes: Negative.   Respiratory: Negative.   Cardiovascular: Negative.   Gastrointestinal: Negative.   Endocrine: Negative.   Genitourinary: Negative.   Musculoskeletal: Negative.   Skin: Negative.   Allergic/Immunologic: Negative.   Neurological: Negative.    Hematological: Negative.   Psychiatric/Behavioral: Negative.     Exam:   BP 112/80   Pulse 68   Resp 16   Wt 139 lb (63 kg)   LMP 12/14/2004   BMI 23.86 kg/m      General appearance: alert, cooperative and appears stated age, no acute distress Head: Normocephalic, without obvious abnormality Neck: no adenopathy, thyroid normal to inspection and palpation Lungs: clear to auscultation bilaterally Breasts: normal appearance, no masses or tenderness, Normal to palpation without dominant masses Heart: regular rate and rhythm Abdomen: soft, non-tender; no masses,  no organomegaly Extremities: extremities normal, no edema Skin: No rashes or lesions Lymph nodes: Cervical, supraclavicular, and axillary nodes normal. No abnormal inguinal nodes palpated Neurologic: Grossly normal   Pelvic: External genitalia:  no lesions              Urethra:  normal appearing urethra with no masses, tenderness or lesions              Bartholins and Skenes: normal                 Vagina: normal appearing vagina, appropriate for age, normal appearing discharge, no lesions              Cervix: neg cervical motion tenderness, no visible lesions             Bimanual Exam:   Uterus:  normal size, contour, position, consistency, mobility, non-tender              Adnexa: no mass, fullness, tenderness                 Joy, CMA Chaperone was present for exam.  A:  Well Woman with normal exam  Osteoporosis identified with 05/2020 DEXA, pt on Fosamax  P:   Pap :done 2020 (no HPV test), next due 2023  Mammogram:done last month, WNL  Labs:to be done with PCP  Medications: Estrace refill not needed at this time F/u 1-2 years for Breast and Pelvic screening

## 2020-12-20 ENCOUNTER — Ambulatory Visit: Payer: Medicare Other

## 2020-12-20 ENCOUNTER — Ambulatory Visit: Payer: Medicare PPO | Admitting: Nurse Practitioner

## 2020-12-20 ENCOUNTER — Encounter: Payer: Self-pay | Admitting: Nurse Practitioner

## 2020-12-20 ENCOUNTER — Other Ambulatory Visit: Payer: Self-pay

## 2020-12-20 VITALS — BP 112/80 | HR 68 | Resp 16 | Wt 139.0 lb

## 2020-12-20 DIAGNOSIS — Z01419 Encounter for gynecological examination (general) (routine) without abnormal findings: Secondary | ICD-10-CM

## 2020-12-20 NOTE — Patient Instructions (Signed)
Health Maintenance After Age 69 After age 69, you are at a higher risk for certain long-term diseases and infections as well as injuries from falls. Falls are a major cause of broken bones and head injuries in people who are older than age 69. Getting regular preventive care can help to keep you healthy and well. Preventive care includes getting regular testing and making lifestyle changes as recommended by your health care provider. Talk with your health care provider about:  Which screenings and tests you should have. A screening is a test that checks for a disease when you have no symptoms.  A diet and exercise plan that is right for you. What should I know about screenings and tests to prevent falls? Screening and testing are the best ways to find a health problem early. Early diagnosis and treatment give you the best chance of managing medical conditions that are common after age 69. Certain conditions and lifestyle choices may make you more likely to have a fall. Your health care provider may recommend:  Regular vision checks. Poor vision and conditions such as cataracts can make you more likely to have a fall. If you wear glasses, make sure to get your prescription updated if your vision changes.  Medicine review. Work with your health care provider to regularly review all of the medicines you are taking, including over-the-counter medicines. Ask your health care provider about any side effects that may make you more likely to have a fall. Tell your health care provider if any medicines that you take make you feel dizzy or sleepy.  Osteoporosis screening. Osteoporosis is a condition that causes the bones to get weaker. This can make the bones weak and cause them to break more easily.  Blood pressure screening. Blood pressure changes and medicines to control blood pressure can make you feel dizzy.  Strength and balance checks. Your health care provider may recommend certain tests to check your  strength and balance while standing, walking, or changing positions.  Foot health exam. Foot pain and numbness, as well as not wearing proper footwear, can make you more likely to have a fall.  Depression screening. You may be more likely to have a fall if you have a fear of falling, feel emotionally low, or feel unable to do activities that you used to do.  Alcohol use screening. Using too much alcohol can affect your balance and may make you more likely to have a fall. What actions can I take to lower my risk of falls? General instructions  Talk with your health care provider about your risks for falling. Tell your health care provider if: ? You fall. Be sure to tell your health care provider about all falls, even ones that seem minor. ? You feel dizzy, sleepy, or off-balance.  Take over-the-counter and prescription medicines only as told by your health care provider. These include any supplements.  Eat a healthy diet and maintain a healthy weight. A healthy diet includes low-fat dairy products, low-fat (lean) meats, and fiber from whole grains, beans, and lots of fruits and vegetables. Home safety  Remove any tripping hazards, such as rugs, cords, and clutter.  Install safety equipment such as grab bars in bathrooms and safety rails on stairs.  Keep rooms and walkways well-lit. Activity   Follow a regular exercise program to stay fit. This will help you maintain your balance. Ask your health care provider what types of exercise are appropriate for you.  If you need a cane or   walker, use it as recommended by your health care provider.  Wear supportive shoes that have nonskid soles. Lifestyle  Do not drink alcohol if your health care provider tells you not to drink.  If you drink alcohol, limit how much you have: ? 0-1 drink a day for women. ? 0-2 drinks a day for men.  Be aware of how much alcohol is in your drink. In the [place] one drink equals one typical bottle of beer (12  oz), one-half glass of wine (5 oz), or one shot of hard liquor (1 oz).  Do not use any products that contain nicotine or tobacco, such as cigarettes and e-cigarettes. If you need help quitting, ask your health care provider. Summary  Having a healthy lifestyle and getting preventive care can help to protect your health and wellness after age 69.  Screening and testing are the best way to find a health problem early and help you avoid having a fall. Early diagnosis and treatment give you the best chance for managing medical conditions that are more common for people who are older than age 69.  Falls are a major cause of broken bones and head injuries in people who are older than age 69. Take precautions to prevent a fall at home.  Work with your health care provider to learn what changes you can make to improve your health and wellness and to prevent falls. This information is not intended to replace advice given to you by your health care provider. Make sure you discuss any questions you have with your health care provider. Document Revised: 03/23/2019 Document Reviewed: 10/13/2017 Elsevier Patient Education  2020 Elsevier Inc.  

## 2020-12-31 NOTE — Addendum Note (Signed)
Addended by: Donivan Scull on: 12/31/2020 11:28 AM   Modules accepted: Level of Service

## 2021-02-05 DIAGNOSIS — R0982 Postnasal drip: Secondary | ICD-10-CM | POA: Diagnosis not present

## 2021-02-05 DIAGNOSIS — R0789 Other chest pain: Secondary | ICD-10-CM | POA: Diagnosis not present

## 2021-03-21 ENCOUNTER — Other Ambulatory Visit: Payer: Self-pay

## 2021-03-21 ENCOUNTER — Ambulatory Visit: Payer: Medicare PPO | Admitting: Pulmonary Disease

## 2021-03-21 ENCOUNTER — Ambulatory Visit (INDEPENDENT_AMBULATORY_CARE_PROVIDER_SITE_OTHER): Payer: Medicare PPO

## 2021-03-21 ENCOUNTER — Encounter: Payer: Self-pay | Admitting: Pulmonary Disease

## 2021-03-21 VITALS — BP 122/78 | HR 85 | Temp 97.3°F | Ht 64.0 in | Wt 137.6 lb

## 2021-03-21 DIAGNOSIS — J4540 Moderate persistent asthma, uncomplicated: Secondary | ICD-10-CM

## 2021-03-21 DIAGNOSIS — J479 Bronchiectasis, uncomplicated: Secondary | ICD-10-CM | POA: Diagnosis not present

## 2021-03-21 DIAGNOSIS — A310 Pulmonary mycobacterial infection: Secondary | ICD-10-CM

## 2021-03-21 DIAGNOSIS — R053 Chronic cough: Secondary | ICD-10-CM | POA: Diagnosis not present

## 2021-03-21 LAB — CBC WITH DIFFERENTIAL/PLATELET
Basophils Absolute: 0 10*3/uL (ref 0.0–0.1)
Basophils Relative: 0.4 % (ref 0.0–3.0)
Eosinophils Absolute: 0.1 10*3/uL (ref 0.0–0.7)
Eosinophils Relative: 1.5 % (ref 0.0–5.0)
HCT: 40 % (ref 36.0–46.0)
Hemoglobin: 14 g/dL (ref 12.0–15.0)
Lymphocytes Relative: 17.8 % (ref 12.0–46.0)
Lymphs Abs: 1.2 10*3/uL (ref 0.7–4.0)
MCHC: 35 g/dL (ref 30.0–36.0)
MCV: 90.1 fl (ref 78.0–100.0)
Monocytes Absolute: 0.6 10*3/uL (ref 0.1–1.0)
Monocytes Relative: 9.4 % (ref 3.0–12.0)
Neutro Abs: 4.6 10*3/uL (ref 1.4–7.7)
Neutrophils Relative %: 70.9 % (ref 43.0–77.0)
Platelets: 268 10*3/uL (ref 150.0–400.0)
RBC: 4.44 Mil/uL (ref 3.87–5.11)
RDW: 12.1 % (ref 11.5–15.5)
WBC: 6.5 10*3/uL (ref 4.0–10.5)

## 2021-03-21 NOTE — Progress Notes (Signed)
[name]  Chief Complaint  Patient presents with  . Follow-up    Pt c/o increased SOB and increased heart rate over the past few wks. She has minimal cough with light yellow sputum. She states that her chest feels "achy". She is using her albuterol inhaler 3-4 x per day.     Constitutional:  BP 122/78 (BP Location: Left Arm, Cuff Size: Normal)   Pulse 85   Temp (!) 97.3 F (36.3 C) (Temporal)   Ht 5\' 4"  (1.626 m)   Wt 137 lb 9.6 oz (62.4 kg)   LMP 12/14/2004   SpO2 99% Comment: on RA  BMI 23.62 kg/m   Past Medical History:  GERD, IBS, Osteopenia, Hyponatremia, Hypothyroidism  Past Surgical History:  She  has a past surgical history that includes Carpal tunnel release (2007); Video bronchoscopy (12/28/2012); Breast surgery (2/07); and Breast excisional biopsy.  Brief Summary:  Kim Vasquez is a 70 y.o. female former smoker with chronic cough, allergic asthma localized BTX, and MAI.       Subjective:   She was seen by cardiology in November for assessment of dyspnea and chest pain. Echo and myocardial perfusion scan were negative.  She last saw Dr. Dorothyann Peng in December 2021.  She had repeat sputum culture sent then.  This was negative for MAI.    She had tele-conference by nephrology for hyponatremia.  Concern for SIADH and question about whether this could be related to MAI.  She has been feeling worse over the past several months.  She is getting episodes of tachycardia in the late evening, but she is still awake when these episodes happen.  She then feels sore in her chest.  She does snore some at night.  She has not had fever.  She coughs up clear sputum intermittently.    Physical Exam:   Appearance - well kempt   ENMT - no sinus tenderness, no oral exudate, no LAN, Mallampati 2 airway, no stridor  Respiratory - equal breath sounds bilaterally, no wheezing or rales  CV - s1s2 regular rate and rhythm, no  murmurs  Ext - no clubbing, no edema  Skin - no rashes  Psych - normal mood and affect   Pulmonary testing:   Bronchoscopy 05/29/08 >> scarring RML, cytology negative; MAI, Penicillium, and Aspergillus [place] in BAL  Bronchoscopy 12/28/12 >> MAI in BAL, cytology negative  PFT 03/01/13 >> FEV1 2.92 (136%), FEV1% 85, TLC 5.91 (124%), DLCO 117%, borderline BD  Spirometry 02/15/17 >> FEV1 2.60 (102%), FEV1% 80  FeNO 04/08/17 >> 22  RAST 06/08/17 >> negative, IgE 49  FeNO 01/16/19 >> 64  Spirometry 01/16/19 >> FEV1 2.5 (105%), FEV1% 80  Chest Imaging:   CT chest 02/08/08 >> RML tree in bud, mild BTX RML  CT chest 12/22/12 >> RUL and RML tree in bud, BTX RML  CT chest 04/23/15 >> BTX RUL/RML, nodular infiltrates RLL/RML increased in size  CT chest 11/23/16 >> cylindrical BTX RUL/RML, scattered nodularity no change  Sleep Tests:    Cardiac Tests:   Echo 11/21/20 >> normal LV function, mild LVH  Social History:  She  reports that she quit smoking about 39 years ago. Her smoking use included cigarettes. She has a 14.00 pack-year smoking history. She has never used smokeless tobacco. She reports current alcohol use of about 7.0 - 14.0 standard drinks of alcohol per week. She reports that she does not use drugs.  Family History:  Her family  history includes Bipolar disorder in her maternal grandmother and sister; Breast cancer in her sister; Colon cancer in her maternal aunt; Diabetes in her paternal grandmother; Heart disease in an other family member; Hypertension in her father; Lymphoma in her mother; Osteoporosis in her paternal grandmother.     Assessment/Plan:   Allergic asthma. - improved with use of singulair to some degree; continue this - she is reluctant to use inhaled steroids with history of MAI and osteoporosis - will check RAST with IgE and CBC with differential, and then determine if she might be a candidate for a biologic agent  Allergic rhinitis. 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 - followed by Dr. Octavio Manns Dicks with Infectious Disease at [facility] - sputum culture from December 2021 was negative - uncertain whether MAI is contributing to SIADH; will repeat chest xray and sputum culture today; defer CT chest imaging for now  Intermittently tachycardia. - she reports her PCP has put in referral to cardiology to possibly arrange for holter monitoring  Time Spent Involved in Patient Care on Day of Examination:  38 minutes  Follow up:  Patient Instructions  Lab tests and chest xray today  Will arrange for sputum culture for MAI  Follow up in 2 months at [facility] office   Medication List:   Allergies as of 03/21/2021      Reactions   Accolate [zafirlukast]    Heart started skipping beats   Bacitracin Itching, Other (See Comments)   Reaction:  Numbness    Cefdinir Itching   Itching eyes, hot rash all over body   Doxycycline Other (See Comments)   Reaction:  Stiff neck and headache    Erythromycin Other (See Comments)   Reaction:  Decreased urine output   Etodolac Other (See Comments)   Reaction:  Dizziness    Levofloxacin Other (See Comments)   Sore throat   Moxifloxacin Other (See Comments)   Reaction:  Stiff neck, dizziness, and headache   Neosporin [neomycin-bacitracin Zn-polymyx] Itching   Polysporin [bacitracin-polymyxin B] Itching   Penicillins Itching, Rash, Other (See Comments)   Has patient had a PCN reaction causing immediate rash, facial/tongue/throat swelling, SOB or lightheadedness with hypotension: No Has patient had a PCN reaction causing severe rash involving mucus membranes or skin necrosis: No Has patient had a PCN reaction that required hospitalization No Has patient had a PCN reaction occurring within the last 10 years: No If all of the above answers are "NO", then may proceed with  Cephalosporin use.      Medication List       Accurate as of March 21, 2021  1:24 PM. If you have any questions, ask your nurse or doctor.        STOP taking these medications   fexofenadine 180 MG tablet Commonly known as: ALLEGRA Stopped by: Chesley Mires, MD     TAKE these medications   albuterol 108 (90 Base) MCG/ACT inhaler Commonly known as: ProAir HFA Inhale 2 puffs into the lungs every 6 (six) hours as needed for wheezing or shortness of breath.   alendronate 70 MG tablet Commonly known as: FOSAMAX Take by mouth as directed.   CALCIUM PO Take by mouth.   cholecalciferol 1000 units tablet Commonly known as: VITAMIN D Take 1,000 Units by mouth daily.   CITRULLINE PO Take by mouth. + argenine   Cranberry 200 MG Caps Take by mouth.  diphenhydrAMINE 25 MG tablet Commonly known as: BENADRYL Take 25 mg by mouth every 6 (six) hours as needed for allergies.   EPINEPHrine 0.3 mg/0.3 mL Soaj injection Commonly known as: EPI-PEN   estradiol 0.1 MG/GM vaginal cream Commonly known as: ESTRACE Place 1 Applicatorful vaginally 3 (three) times a week. What changed: when to take this   famotidine 20 MG tablet Commonly known as: PEPCID Take 20 mg by mouth daily as needed for heartburn or indigestion.   Flaxseed Oil 1000 MG Caps Take 1,000 mg by mouth daily.   loratadine 10 MG tablet Commonly known as: CLARITIN Take 10 mg by mouth daily as needed.   magnesium gluconate 500 MG tablet Commonly known as: MAGONATE Take 500 mg by mouth at bedtime.   MAGNESIUM PO Take 750 mg by mouth at bedtime.   montelukast 10 MG tablet Commonly known as: SINGULAIR Take 10 mg by mouth at bedtime.   pravastatin 40 MG tablet Commonly known as: PRAVACHOL Take 40 mg by mouth at bedtime.   Symbicort 80-4.5 MCG/ACT inhaler Generic drug: budesonide-formoterol INHALE 2 PUFFS INTO THE LUNGS 2 (TWO) TIMES DAILY AS NEEDED.   SYNTHROID PO Take 37.5 mcg by mouth.   vitamin C 500 MG  tablet Commonly known as: ASCORBIC ACID Take 500 mg by mouth daily.       Signature:  Chesley Mires, MD [place] Pager - [phone] 03/21/2021, 1:24 PM

## 2021-03-21 NOTE — Patient Instructions (Signed)
Lab tests and chest xray today  Will arrange for sputum culture for MAI  Follow up in 2 months at [facility] office

## 2021-03-24 LAB — RESPIRATORY ALLERGY PROFILE REGION II ~~LOC~~

## 2021-03-24 LAB — INTERPRETATION:

## 2021-03-25 NOTE — Telephone Encounter (Signed)
Received the following message from patient:   "Dear Dr. Halford Chessman, The x-ray results indicates it is consistent with 2017 and 2018 images and MAI, and mentions opacity. The [facility] x-ray of 2021 does not mention opacity. Is this a qualitatively different finding? Thanks, Lynia Landry"  Dr. Halford Chessman, can you please advise? Thanks!

## 2021-05-06 ENCOUNTER — Other Ambulatory Visit: Payer: Self-pay

## 2021-05-06 ENCOUNTER — Encounter: Payer: Self-pay | Admitting: Pulmonary Disease

## 2021-05-06 ENCOUNTER — Ambulatory Visit (INDEPENDENT_AMBULATORY_CARE_PROVIDER_SITE_OTHER): Payer: Medicare PPO

## 2021-05-06 ENCOUNTER — Ambulatory Visit: Payer: Medicare PPO | Admitting: Pulmonary Disease

## 2021-05-06 VITALS — BP 128/72 | HR 74 | Temp 97.6°F | Ht 64.0 in | Wt 140.6 lb

## 2021-05-06 DIAGNOSIS — J479 Bronchiectasis, uncomplicated: Secondary | ICD-10-CM

## 2021-05-06 DIAGNOSIS — R042 Hemoptysis: Secondary | ICD-10-CM

## 2021-05-06 LAB — AFB CULTURE WITH SMEAR (NOT AT ARMC)
Acid Fast Culture: NEGATIVE
Acid Fast Smear: NEGATIVE

## 2021-05-06 NOTE — Progress Notes (Signed)
[facility]  Chief Complaint  Patient presents with  . Follow-up    C/o coughing up blood yesterday, bright red, total 3 tsp., none today, covid test neg, headache    Constitutional:  BP 128/72 (BP Location: Right Arm, Cuff Size: Normal)   Pulse 74   Temp 97.6 F (36.4 C) (Temporal)   Ht 5\' 4"  (1.626 m)   Wt 140 lb 9.6 oz (63.8 kg)   LMP 12/14/2004   SpO2 99%   BMI 24.13 kg/m   Past Medical History:  GERD, IBS, Osteopenia, Hyponatremia, Hypothyroidism  Past Surgical History:  She  has a past surgical history that includes Carpal tunnel release (2007); Video bronchoscopy (12/28/2012); Breast surgery (2/07); and Breast excisional biopsy.  Brief Summary:  Kim Vasquez is a 70 y.o. female former smoker with chronic cough, allergic asthma localized BTX, and MAI.       Subjective:   She was swimming yesterday and then developed sudden urge to cough.  She could up mucus and blood.  Happened one time, and no more since.  She was having a headache over the past several days, but thought she was dehydrated.  She has limited fluid intake due to hyponatremia.  She has persistent chest discomfort, but this has been present for a while.  She had holter monitor testing, and is waiting to hear back from cardiology.  She stopped taking pravastatin and some of her chest soreness got better.  She felt her sinuses were better on singulair, but reluctant to use this until it is clear what is causing her low sodium.  She has nephrology appointment in June.  She saw Dr. Dorothyann Peng and had sputum test for AFB with is still pending.  She is not having fever, sweats, or wheezing.  RAST from April was negative and eosinophils weren't significantly elevated on CBC.  She was advised by Dr. Dorothyann Peng not to go on biologic agent with hx of MAI.  She is not aware of having nose bleeds.  Denies hematemesis.    Physical Exam:   Appearance - well kempt   ENMT - no  sinus tenderness, no oral exudate, no LAN, Mallampati 2 airway, no stridor, pale nasal mucosa  Respiratory - equal breath sounds bilaterally, no wheezing or rales  CV - s1s2 regular rate and rhythm, no murmurs  Ext - no clubbing, no edema  Skin - no rashes  Psych - normal mood and affect   Pulmonary testing:   Bronchoscopy 05/29/08 >> scarring RML, cytology negative; MAI, Penicillium, and Aspergillus [place] in BAL  Bronchoscopy 12/28/12 >> MAI in BAL, cytology negative  PFT 03/01/13 >> FEV1 2.92 (136%), FEV1% 85, TLC 5.91 (124%), DLCO 117%, borderline BD  Spirometry 02/15/17 >> FEV1 2.60 (102%), FEV1% 80  FeNO 04/08/17 >> 22  RAST 06/08/17 >> negative, IgE 49  FeNO 01/16/19 >> 64  Spirometry 01/16/19 >> FEV1 2.5 (105%), FEV1% 80  RAST 03/21/21 >> negative, IgE 85  Chest Imaging:   CT chest 02/08/08 >> RML tree in bud, mild BTX RML  CT chest 12/22/12 >> RUL and RML tree in bud, BTX RML  CT chest 04/23/15 >> BTX RUL/RML, nodular infiltrates RLL/RML increased in size  CT chest 11/23/16 >> cylindrical BTX RUL/RML, scattered nodularity no change  Cardiac Tests:   Echo 11/21/20 >> normal LV function, mild LVH  Social History:  She  reports that she quit smoking about 39 years ago. Her smoking use included cigarettes. She has  a 14.00 pack-year smoking history. She has never used smokeless tobacco. She reports current alcohol use of about 7.0 - 14.0 standard drinks of alcohol per week. She reports that she does not use drugs.  Family History:  Her family history includes Bipolar disorder in her maternal grandmother and sister; Breast cancer in her sister; Colon cancer in her maternal aunt; Diabetes in her paternal grandmother; Heart disease in an other family member; Hypertension in her father; Lymphoma in her mother; Osteoporosis in her paternal grandmother.     Assessment/Plan:   Hemoptysis. - had one episode on 03/08/70 - uncertain cause of this, but doesn't seem like she has  active infection at present - will get chest xray and then determine if she needs CT chest imaging - if recurs, then might need bronchoscopy for airway inspection  Allergic asthma and rhinitis. 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 - followed by Dr. Octavio Manns Dicks with Infectious Disease at [facility] - she had repeat sputum for AFB sent earlier this month  Intermittently tachycardia. - follow up with cardiology at [facility]  Hyponatremia. - she has nephrology appointment at [facility] in June  Time Spent Involved in Patient Care on Day of Examination:  32 minutes  Follow up:  Patient Instructions  Chest xray today and will call you with results  Follow up in 4 months in [place] office   Medication List:   Allergies as of 05/06/2021      Reactions   Accolate [zafirlukast]    Heart started skipping beats   Bacitracin Itching, Other (See Comments)   Reaction:  Numbness    Cefdinir Itching   Itching eyes, hot rash all over body   Doxycycline Other (See Comments)   Reaction:  Stiff neck and headache    Erythromycin Other (See Comments)   Reaction:  Decreased urine output   Etodolac Other (See Comments)   Reaction:  Dizziness    Levofloxacin Other (See Comments)   Sore throat   Moxifloxacin Other (See Comments)   Reaction:  Stiff neck, dizziness, and headache   Neosporin [neomycin-bacitracin Zn-polymyx] Itching   Polysporin [bacitracin-polymyxin B] Itching   Penicillins Itching, Rash, Other (See Comments)   Has patient had a PCN reaction causing  immediate rash, facial/tongue/throat swelling, SOB or lightheadedness with hypotension: No Has patient had a PCN reaction causing severe rash involving mucus membranes or skin necrosis: No Has patient had a PCN reaction that required hospitalization No Has patient had a PCN reaction occurring within the last 10 years: No If all of the above answers are "NO", then may proceed with Cephalosporin use.      Medication List       Accurate as of May 06, 2021  1:34 PM. If you have any questions, ask your nurse or doctor.        STOP taking these medications   loratadine 10 MG tablet Commonly known as: CLARITIN Stopped by: Chesley Mires, MD   MAGNESIUM PO Stopped by: Chesley Mires, MD   pravastatin 40 MG tablet Commonly known as: PRAVACHOL Stopped by: Chesley Mires, MD  TAKE these medications   albuterol 108 (90 Base) MCG/ACT inhaler Commonly known as: ProAir HFA Inhale 2 puffs into the lungs every 6 (six) hours as needed for wheezing or shortness of breath.   alendronate 70 MG tablet Commonly known as: FOSAMAX Take by mouth as directed.   CALCIUM PO Take by mouth.   cholecalciferol 1000 units tablet Commonly known as: VITAMIN D Take 1,000 Units by mouth daily.   CITRULLINE PO Take by mouth. + argenine   Cranberry 200 MG Caps Take by mouth.   diphenhydrAMINE 25 MG tablet Commonly known as: BENADRYL Take 25 mg by mouth every 6 (six) hours as needed for allergies.   EPINEPHrine 0.3 mg/0.3 mL Soaj injection Commonly known as: EPI-PEN   estradiol 0.1 MG/GM vaginal cream Commonly known as: ESTRACE Place 1 Applicatorful vaginally 3 (three) times a week. What changed:   when to take this  additional instructions   famotidine 20 MG tablet Commonly known as: PEPCID Take 20 mg by mouth daily as needed for heartburn or indigestion.   Flaxseed Oil 1000 MG Caps Take 1,000 mg by mouth daily.   magnesium gluconate 500 MG tablet Commonly known as: MAGONATE Take 500 mg  by mouth at bedtime.   montelukast 10 MG tablet Commonly known as: SINGULAIR Take 10 mg by mouth at bedtime.   Symbicort 80-4.5 MCG/ACT inhaler Generic drug: budesonide-formoterol 2 puffs daily.   SYNTHROID PO Take 37.5 mcg by mouth.   vitamin C 500 MG tablet Commonly known as: ASCORBIC ACID Take 500 mg by mouth daily.       Signature:  Chesley Mires, MD [place] Pager - [phone] 05/06/2021, 1:34 PM

## 2021-05-06 NOTE — Patient Instructions (Signed)
Chest xray today and will call you with results  Follow up in 4 months in [place] office

## 2021-05-07 ENCOUNTER — Telehealth: Payer: Self-pay | Admitting: Pulmonary Disease

## 2021-05-07 DIAGNOSIS — R042 Hemoptysis: Secondary | ICD-10-CM

## 2021-05-07 DIAGNOSIS — R918 Other nonspecific abnormal finding of lung field: Secondary | ICD-10-CM

## 2021-05-07 DIAGNOSIS — A310 Pulmonary mycobacterial infection: Secondary | ICD-10-CM

## 2021-05-07 DIAGNOSIS — J479 Bronchiectasis, uncomplicated: Secondary | ICD-10-CM

## 2021-05-07 NOTE — Telephone Encounter (Signed)
DG Chest 2 View  Result Date: 05/07/2021 CLINICAL DATA:  Hemoptysis, history of bronchiectasis, MAC EXAM: CHEST - 2 VIEW COMPARISON:  03/21/2021 FINDINGS: Normal heart size, mediastinal contours, and pulmonary vascularity. Atherosclerotic calcification aorta. Scarring in RIGHT middle lobe again seen. Patchy opacities again identified in the RIGHT upper lobe, question minimally increased. These could represent atypical infection though developing pulmonary nodules are not excluded. Question nodule lower RIGHT lung 9 mm diameter. No pleural effusion or pneumothorax. Minimal degenerative disc disease changes thoracic spine. IMPRESSION: Question 9 mm nodular density lower RIGHT lung. Progressive vague opacities in the RIGHT upper lobe, may represent progressive atypical infection though developing pulmonary nodule not excluded. Characterization of these abnormalities by CT chest recommended. Electronically Signed   By: Lavonia Dana M.D.   On: 05/07/2021 17:08    Results d/w pt.  Given episode of hemoptysis and progression of changes on chest xray will arrange for CT chest without contrast to further assess.  She lives in [place], so will see if the scan can be done in [place] or [place] area through [facility] affiliated center.

## 2021-05-20 ENCOUNTER — Other Ambulatory Visit: Payer: Self-pay

## 2021-05-20 ENCOUNTER — Ambulatory Visit
Admission: RE | Admit: 2021-05-20 | Discharge: 2021-05-20 | Disposition: A | Discharge status: Home / Self Care | Payer: Medicare PPO | Source: Ambulatory Visit | Attending: Pulmonary Disease | Admitting: Pulmonary Disease

## 2021-05-20 DIAGNOSIS — A310 Pulmonary mycobacterial infection: Secondary | ICD-10-CM | POA: Diagnosis present

## 2021-05-20 DIAGNOSIS — J479 Bronchiectasis, uncomplicated: Secondary | ICD-10-CM

## 2021-05-20 DIAGNOSIS — R918 Other nonspecific abnormal finding of lung field: Secondary | ICD-10-CM | POA: Diagnosis present

## 2021-05-20 DIAGNOSIS — R042 Hemoptysis: Secondary | ICD-10-CM

## 2021-05-21 ENCOUNTER — Telehealth: Payer: Self-pay | Admitting: Pulmonary Disease

## 2021-05-21 DIAGNOSIS — R911 Solitary pulmonary nodule: Secondary | ICD-10-CM

## 2021-05-21 NOTE — Telephone Encounter (Signed)
CT Chest Wo Contrast  Result Date: 05/20/2021 CLINICAL DATA:  Hemoptysis. EXAM: CT CHEST WITHOUT CONTRAST TECHNIQUE: Multidetector CT imaging of the chest was performed following the standard protocol without IV contrast. COMPARISON:  Chest radiograph 05/06/2021 and CT chest 11/23/2016. FINDINGS: Cardiovascular: Atherosclerotic calcification of the aorta and coronary arteries. Heart size normal. No pericardial effusion. Mediastinum/Nodes: No pathologically enlarged mediastinal or axillary lymph nodes. Hilar regions are difficult to definitively evaluate without IV contrast. Esophagus is grossly unremarkable. Lungs/Pleura: Biapical pleuroparenchymal scarring. Interval progression in cylindrical and varicose bronchiectasis and peribronchovascular nodularity. There is a dominant nodule in the inferior right middle lobe, measuring 10 x 14 mm (3/99), new from 11/23/2016. No pleural fluid. Airway is otherwise unremarkable. Upper Abdomen: Visualized portions of the liver, adrenal glands, kidneys, spleen, pancreas, stomach and bowel are grossly unremarkable. Musculoskeletal: Degenerative changes in the spine. No worrisome lytic or sclerotic lesions. IMPRESSION: 1. Interval progression in bronchiectasis and peribronchovascular nodularity, findings indicative of chronic mycobacterium avium complex. 2. Dominant nodular lesion in the right middle lobe, new from 11/23/2016. This is strongly felt to be infectious/inflammatory but malignancy cannot be definitively excluded. Recommend follow-up CT chest without contrast in 2-3 months, as clinically indicated. 3. Aortic atherosclerosis (ICD10-I70.0). Coronary artery calcification. Electronically Signed   By: Lorin Picket M.D.   On: 05/20/2021 14:19    Discussed with patient. Seems more like infectious/inflammatory process causing nodule in RML, but still could be malignancy.   Reviewed different options on how to proceed.  She is not having any new respiratory symptoms at  this time and has not coughed up blood again.  Will arrange for PET scan to better define activity of RML nodule and then determine if she needs bronchoscopy.  She will contact Dr. Dorothyann Peng at [facility] to arrange for repeat sputum culture.

## 2021-05-22 NOTE — Telephone Encounter (Signed)
Dear Dr. Halford Chessman,    I exchanged messages with Dr. Dorothyann Peng.  She checked on the MAC/MAI cultures and, unfortunately, both are growing MAC.  Accordingly, she does not think another culture is indicated, but has put in an order for me to submit one if I want to.  Is there any reason to do so, or other issue that might be revealed by another culture?   She asked that I follow up when the PET scan is complete (June 22).  She also asked that I discuss with you any additional airway clearance measures that might be indicated.   Thanks, Energy manager  Dr. Halford Chessman, please advise on mychart message. Thanks!

## 2021-06-02 ENCOUNTER — Telehealth: Payer: Self-pay | Admitting: Pulmonary Disease

## 2021-06-03 NOTE — Telephone Encounter (Signed)
We have been working on getting this Auth I will call [employer] once its open

## 2021-06-03 NOTE — Telephone Encounter (Signed)
This is under clinical review with [facility] scheduling has resc it to 07/05

## 2021-06-03 NOTE — Telephone Encounter (Signed)
[employer] calling on behalf of Pt does not meet requirement for PA because the nodule is less than 1.5 centimeters and she has a limited background of smoking. Will need to go to an MD reviewer. May require a peer to peer. Reccommends this be withdrawn. If doctor decides to do peer to peer, no need to contact [place] again. Fax: [phone], telephone: [phone]

## 2021-06-04 ENCOUNTER — Ambulatory Visit: Payer: Medicare PPO

## 2021-06-04 NOTE — Telephone Encounter (Signed)
Pet was approved and patient was called she asked it be moved up got it scheduled for 6/29 pt aware

## 2021-06-11 ENCOUNTER — Other Ambulatory Visit: Payer: Self-pay

## 2021-06-11 ENCOUNTER — Ambulatory Visit
Admission: RE | Admit: 2021-06-11 | Discharge: 2021-06-11 | Disposition: A | Discharge status: Home / Self Care | Payer: Medicare PPO | Source: Ambulatory Visit | Attending: Pulmonary Disease | Admitting: Pulmonary Disease

## 2021-06-11 DIAGNOSIS — J479 Bronchiectasis, uncomplicated: Secondary | ICD-10-CM | POA: Diagnosis not present

## 2021-06-11 DIAGNOSIS — R911 Solitary pulmonary nodule: Secondary | ICD-10-CM | POA: Insufficient documentation

## 2021-06-11 DIAGNOSIS — I700 Atherosclerosis of aorta: Secondary | ICD-10-CM | POA: Diagnosis not present

## 2021-06-11 LAB — GLUCOSE, CAPILLARY: Glucose-Capillary: 100 mg/dL — ABNORMAL HIGH (ref 70–99)

## 2021-06-11 MED ORDER — FLUDEOXYGLUCOSE F - 18 (FDG) INJECTION
7.3000 | Freq: Once | INTRAVENOUS | Status: AC | PRN
  Administered 2021-06-11: 7.38 via INTRAVENOUS

## 2021-06-13 ENCOUNTER — Telehealth: Payer: Self-pay | Admitting: Pulmonary Disease

## 2021-06-13 MED ORDER — SPIRIVA RESPIMAT 2.5 MCG/ACT IN AERS: 2.0000 | INHALATION_SPRAY | Freq: Every day | RESPIRATORY_TRACT | 5 refills | Status: DC

## 2021-06-13 NOTE — Telephone Encounter (Signed)
NM PET Image Initial (PI) Skull Base To Thigh  Result Date: 06/12/2021 CLINICAL DATA:  Initial treatment strategy for lung nodule. EXAM: NUCLEAR MEDICINE PET SKULL BASE TO THIGH TECHNIQUE: 7.2 mCi F-18 FDG was injected intravenously. Full-ring PET imaging was performed from the skull base to thigh after the radiotracer. CT data was obtained and used for attenuation correction and anatomic localization. Fasting blood glucose: 100 mg/dl COMPARISON:  CT 05/20/2021 FINDINGS: Mediastinal blood pool activity: SUV max 2.2 NECK: No hypermetabolic lymph nodes in the neck. Incidental CT findings: none CHEST: Again demonstrated multiple nodules within the RIGHT upper lobe and RIGHT middle lobe with associated bronchial thickening and RIGHT middle lobe bronchiectasis. The dominant nodule of concern in the inferior RIGHT middle lobe measures 13 mm compared to 14 mm and has low metabolic activity SUV max 1.8. (Image 107) The other scattered nodules also have low metabolic activity. For example cluster of nodules in the RIGHT upper lobe (image 83) with SUV max equal 1.1. Within the posterosuperior aspect of the RIGHT upper lobe there is a new 11 mm nodule (image 74) also with low metabolic activity (SUV max equal 1.7). No hypermetabolic mediastinal lymph nodes. LEFT lung is free of nodules. Incidental CT findings: none ABDOMEN/PELVIS: No abnormal hypermetabolic activity within the liver, pancreas, adrenal glands, or spleen. No hypermetabolic lymph nodes in the abdomen or pelvis. Incidental CT findings: Atherosclerotic calcification of the aorta. SKELETON: No focal hypermetabolic activity to suggest skeletal metastasis. Incidental CT findings: none IMPRESSION: 1. Low metabolic activity associated with the RIGHT lung nodules suggest benign inflammatory or infectious process. Additionally the pattern of nodularity, bronchial thickening and bronchiectasis is typical of a chronic pulmonary infection. 2. New nodule in the RIGHT upper  lobe is typical of waxing and waning pulmonary nodularity. 3. Recommend follow-up chest CT in 3-6 months as these are new findings as 05/20/2021. Electronically Signed   By: Suzy Bouchard M.D.   On: 06/12/2021 12:14     Results d/w patient.  Imaging more consistent with infection (MAC) causing nodules.  She was seen by ID at [facility].  Not very good options for MAC treatment at this point.  Plan is for clinical observation.  Still having symptoms from asthma.  Unable to use ICS due to MAC.  Unable to use LABA due to history of tachycardia.  Intolerant of singulair.  Will have her try spiriva respimat.  She will message after using for a couple of weeks to update status.

## 2021-06-17 ENCOUNTER — Ambulatory Visit: Payer: Medicare PPO

## 2021-09-05 ENCOUNTER — Ambulatory Visit: Payer: Medicare PPO | Admitting: Pulmonary Disease

## 2021-09-05 ENCOUNTER — Encounter: Payer: Self-pay | Admitting: Pulmonary Disease

## 2021-09-05 ENCOUNTER — Other Ambulatory Visit: Payer: Self-pay

## 2021-09-05 VITALS — BP 110/70 | HR 90 | Temp 98.0°F | Ht 64.0 in | Wt 141.0 lb

## 2021-09-05 DIAGNOSIS — J479 Bronchiectasis, uncomplicated: Secondary | ICD-10-CM

## 2021-09-05 DIAGNOSIS — A310 Pulmonary mycobacterial infection: Secondary | ICD-10-CM

## 2021-09-05 DIAGNOSIS — R918 Other nonspecific abnormal finding of lung field: Secondary | ICD-10-CM | POA: Diagnosis not present

## 2021-09-05 NOTE — Patient Instructions (Signed)
Will arrange for flutter valve  Will arrange for CT chest in June 2023 and follow up after that

## 2021-09-05 NOTE — Progress Notes (Signed)
[facility]  Chief Complaint  Patient presents with   Follow-up    Patient reports she is doing about the same. No cough,      Constitutional:  BP 110/70 (BP Location: Left Arm, Patient Position: Sitting, Cuff Size: Normal)   Pulse 90   Temp 98 F (36.7 C) (Oral)   Ht 5\' 4"  (1.626 m)   Wt 141 lb (64 kg)   LMP 12/14/2004   SpO2 99%   BMI 24.20 kg/m   Past Medical History:  GERD, IBS, Osteopenia, Hyponatremia, Hypothyroidism  Past Surgical History:  She  has a past surgical history that includes Carpal tunnel release (2007); Video bronchoscopy (12/28/2012); Breast surgery (2/07); and Breast excisional biopsy.  Brief Summary:  Kim Vasquez is a 70 y.o. female former smoker with chronic cough, allergic asthma localized BTX, and MAI.        Subjective:   She saw Dr. Dorothyann Peng in August.  She was doing well at the time and decision to continue clinical monitoring off therapy for MAI.  She was doing better until season started changing into Fall.  She has chest congestion and feels tight in her chest.  She has sinus congestion also.  She hasn't been using spiriva.  She has been using albuterol and this helped.      Physical Exam:   Appearance - well kempt   ENMT - no sinus tenderness, no oral exudate, no LAN, Mallampati 2 airway, no stridor  Respiratory - equal breath sounds bilaterally, no wheezing or rales  CV - s1s2 regular rate and rhythm, no murmurs  Ext - no clubbing, no edema  Skin - no rashes  Psych - normal mood and affect    Pulmonary testing:  Bronchoscopy 05/29/08 >> scarring RML, cytology negative; MAI, Penicillium, and Aspergillus [place] in BAL Bronchoscopy 12/28/12 >> MAI in BAL, cytology negative PFT 03/01/13 >> FEV1 2.92 (136%), FEV1% 85, TLC 5.91 (124%), DLCO 117%, borderline BD Spirometry 02/15/17 >> FEV1 2.60 (102%), FEV1% 80 FeNO 04/08/17 >> 22 RAST 06/08/17 >> negative, IgE 49 FeNO 01/16/19 >>  64 Spirometry 01/16/19 >> FEV1 2.5 (105%), FEV1% 80 RAST 03/21/21 >> negative, IgE 85  Chest Imaging:  CT chest 02/08/08 >> RML tree in bud, mild BTX RML CT chest 12/22/12 >> RUL and RML tree in bud, BTX RML CT chest 04/23/15 >> BTX RUL/RML, nodular infiltrates RLL/RML increased in size CT chest 11/23/16 >> cylindrical BTX RUL/RML, scattered nodularity no change CT chest 05/20/21 >> biapical scarring, progression of BTX, 10 x 14 mm nodule RML PET scan 08/29/37 >> low metabolic activity in nodules  Cardiac Tests:  Echo 11/21/20 >> normal LV function, mild LVH  Social History:  She  reports that she quit smoking about 39 years ago. Her smoking use included cigarettes. She has a 14.00 pack-year smoking history. She has never used smokeless tobacco. She reports current alcohol use of about 7.0 - 14.0 standard drinks per week. She reports that she does not use drugs.  Family History:  Her family history includes Bipolar disorder in her maternal grandmother and sister; Breast cancer in her sister; Colon cancer in her maternal aunt; Diabetes in her paternal grandmother; Heart disease in an other family member; Hypertension in her father; Lymphoma in her mother; Osteoporosis in her paternal grandmother.     Assessment/Plan:   Allergic asthma and rhinitis. 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 - followed by Dr. Octavio Manns Dicks with Infectious Disease at [place] chest from June 2022 shows some progression with waxing/waning nodules - deferring ant-mycobacterial  therapy unless symptoms progress - will plan to follow up CT chest without contrast in June 2023 - explained importance of bronchial hygiene and phlegm clearance - will arrange for flutter valve - continue prn mucinex, saline nebulization  Intermittently tachycardia. - seen by cardiology at [facility]  Hyponatremia. - seen by nephrology at [facility]  Time Spent Involved in Patient Care on Day of Examination:  34 minutes  Follow up:   Patient Instructions  Will arrange for flutter valve  Will arrange for CT chest in June 2023 and follow up after that  Medication List:   Allergies as of 09/05/2021       Reactions   Accolate [zafirlukast] Other (See Comments)   Heart started skipping beats   Cefdinir Itching   Itching eyes, hot rash all over body   Doxycycline Other (See Comments)   Reaction:  Stiff neck and headache    Erythromycin Other (See Comments)   Reaction:  Decreased urine output   Etodolac Other (See Comments)   Reaction:  Dizziness    Levofloxacin Other (See Comments)   Sore throat   Moxifloxacin Other (See Comments)   Reaction:  Stiff neck, dizziness, and headache   Bacitracin Itching, Other (See Comments)   Reaction:  Numbness    Neosporin [neomycin-bacitracin Zn-polymyx] Itching   Penicillins Itching, Rash, Other (See Comments)   Has patient had a PCN reaction causing immediate rash, facial/tongue/throat swelling, SOB or lightheadedness with hypotension: No Has patient had a PCN reaction causing severe rash involving mucus membranes or skin necrosis: No Has patient had a PCN reaction that required hospitalization No Has patient had a PCN reaction occurring within the last 10 years: No If all of the above answers are "NO", then may proceed with Cephalosporin use.   Polysporin [bacitracin-polymyxin B] Itching        Medication List        Accurate as of September 05, 2021  5:19 PM. If you have any questions, ask your nurse or doctor.          albuterol  108 (90 Base) MCG/ACT inhaler Commonly known as: ProAir HFA Inhale 2 puffs into the lungs every 6 (six) hours as needed for wheezing or shortness of breath.   alendronate 70 MG tablet Commonly known as: FOSAMAX Take by mouth as directed.   CALCIUM PO Take by mouth.   cholecalciferol 1000 units tablet Commonly known as: VITAMIN D Take 1,000 Units by mouth daily.   CITRULLINE PO Take by mouth. + argenine   Cranberry 200 MG Caps Take by mouth.   diphenhydrAMINE 25 MG tablet Commonly known as: BENADRYL Take 25 mg by mouth every 6 (six) hours as needed for allergies.   EPINEPHrine 0.3 mg/0.3 mL Soaj injection Commonly known as: EPI-PEN   estradiol 0.1 MG/GM vaginal cream Commonly known as: ESTRACE Place 1 Applicatorful vaginally 3 (three) times a week. What changed:  when to take this additional instructions   famotidine 20 MG tablet Commonly known as: PEPCID Take  20 mg by mouth daily as needed for heartburn or indigestion.   Flaxseed Oil 1000 MG Caps Take 1,000 mg by mouth daily.   magnesium gluconate 500 MG tablet Commonly known as: MAGONATE Take 500 mg by mouth at bedtime.   Spiriva Respimat 2.5 MCG/ACT Aers Generic drug: Tiotropium Bromide Monohydrate Inhale 2 puffs into the lungs daily.   SYNTHROID PO Take 37.5 mcg by mouth.   vitamin C 500 MG tablet Commonly known as: ASCORBIC ACID Take 500 mg by mouth daily.        Signature:  Chesley Mires, MD [place] Pager - [phone] 09/05/2021, 5:19 PM

## 2021-11-21 IMAGING — CT CT CHEST W/O CM
1 series · 15 of 34 positions shown, 19 images · non-contrast
Comparison: Chest radiograph 05/06/2021 and CT chest 11/23/2016.

CLINICAL DATA: Hemoptysis.

EXAM:
CT CHEST WITHOUT CONTRAST
TECHNIQUE: Multidetector CT imaging of the chest was performed following the
standard protocol without IV contrast.

[Series 2: thorax · axial · 0.63mm/px · z∈[-754,-484]mm · 15 of 159 slices shown, 19 images]
[im 12/159  mediastinal]
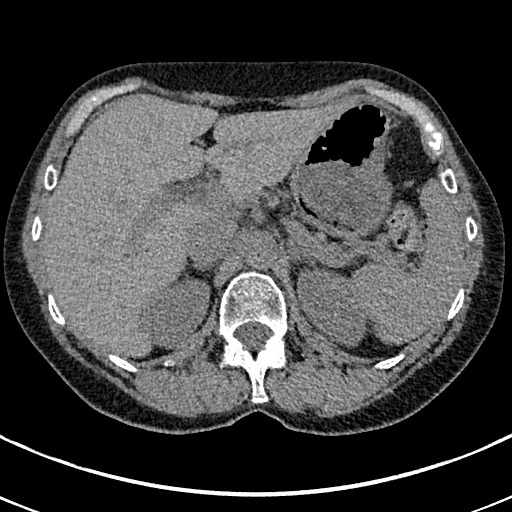
[im 12/159  lung]
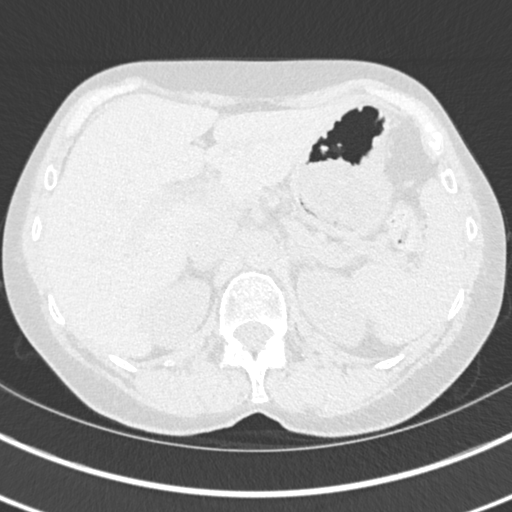
[im 24/159  lung]
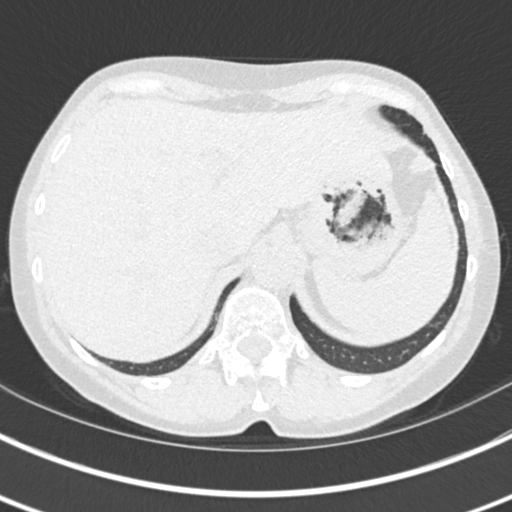
[im 32/159  lung]
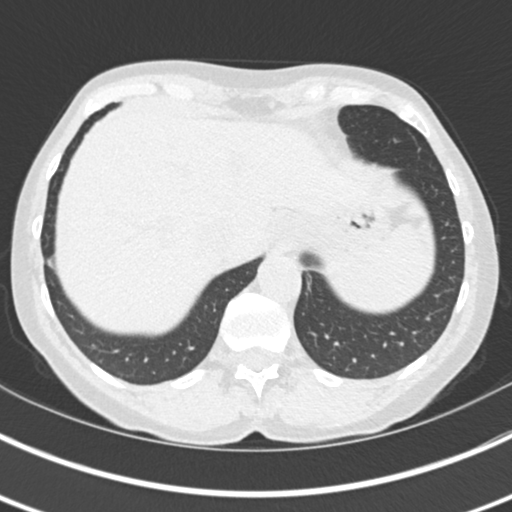
[im 41/159  lung]
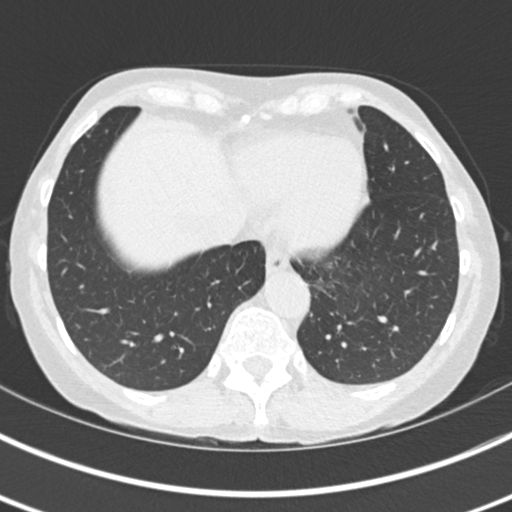
[im 53/159  mediastinal]
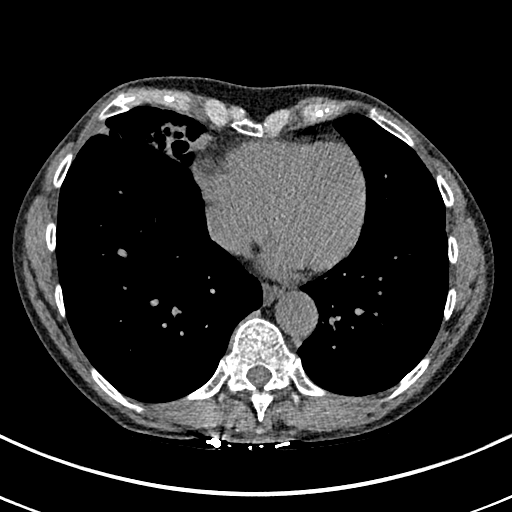
[im 53/159  lung]
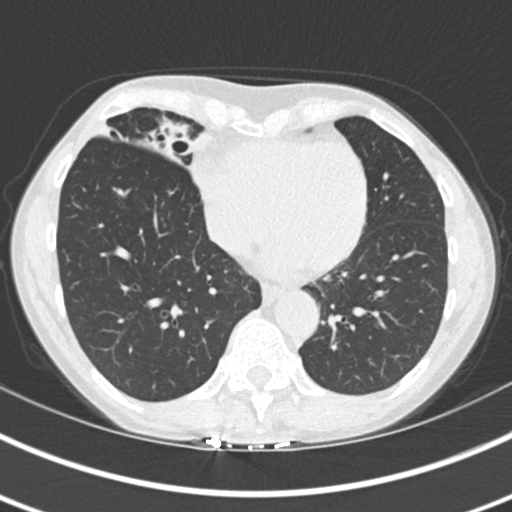
[im 64/159  lung]
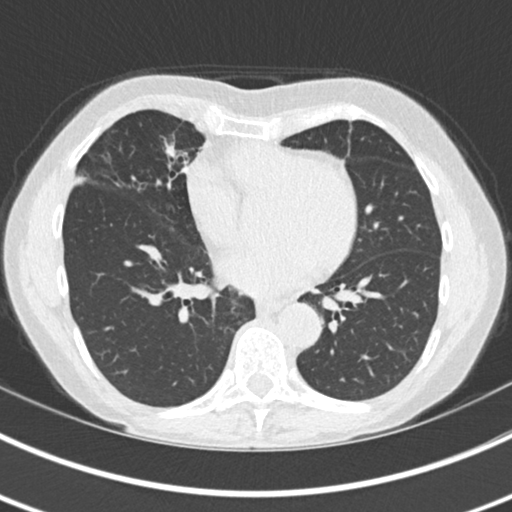
[im 71/159  lung]
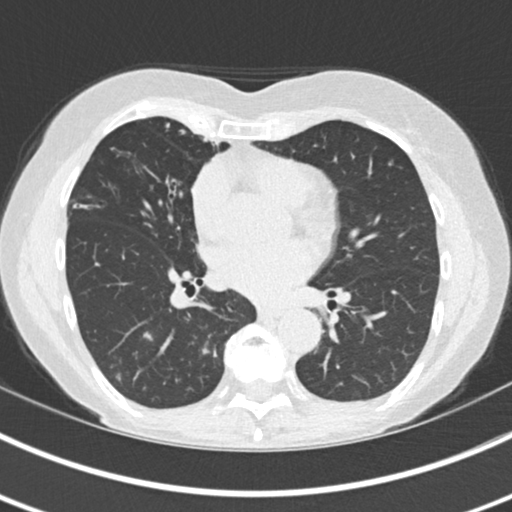
[im 82/159  lung]
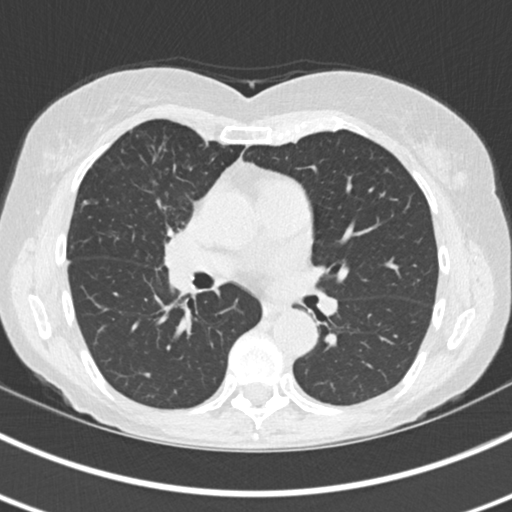
[im 88/159  mediastinal]
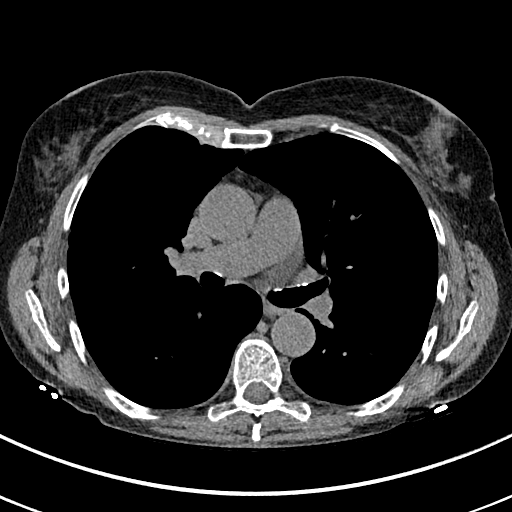
[im 88/159  lung]
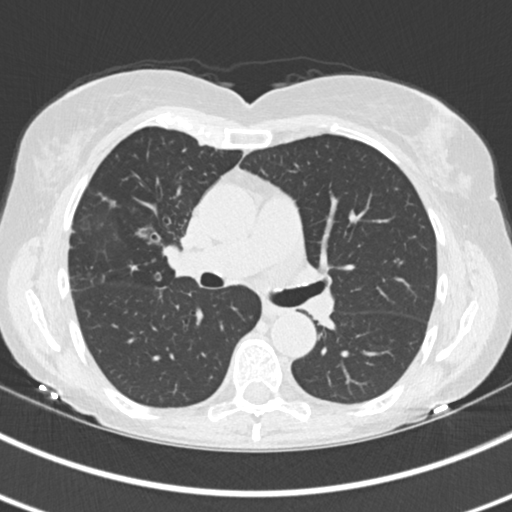
[im 95/159  lung]
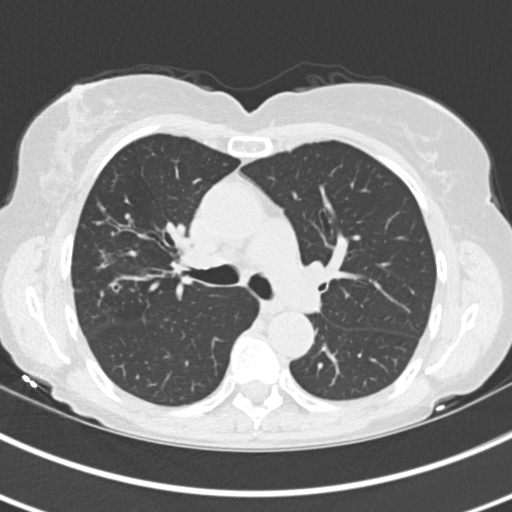
[im 106/159  lung]
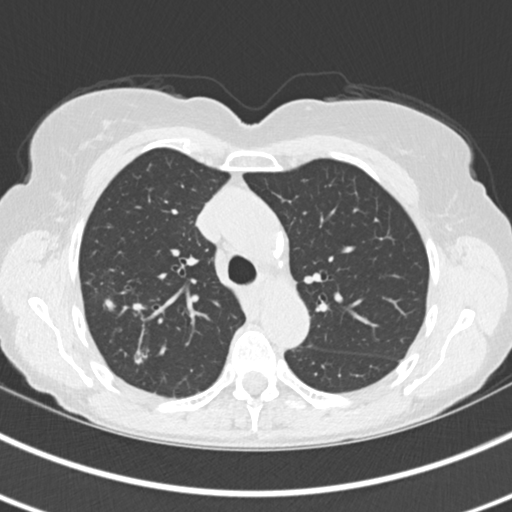
[im 118/159  lung]
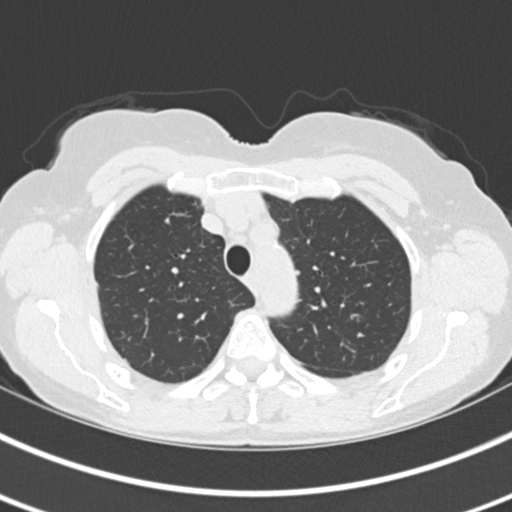
[im 127/159  mediastinal]
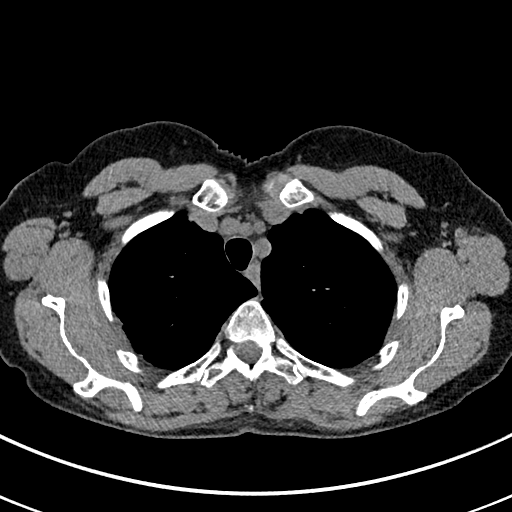
[im 127/159  lung]
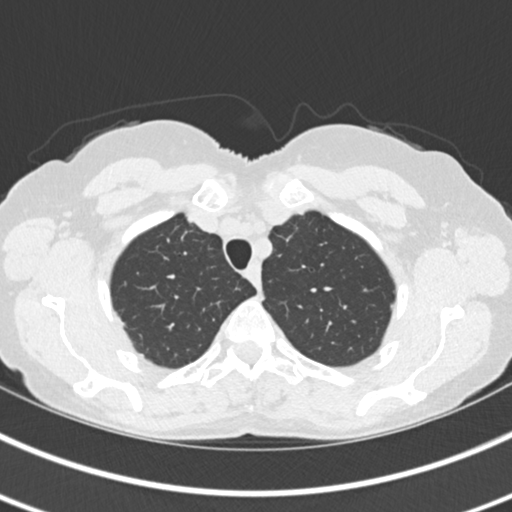
[im 135/159  lung]
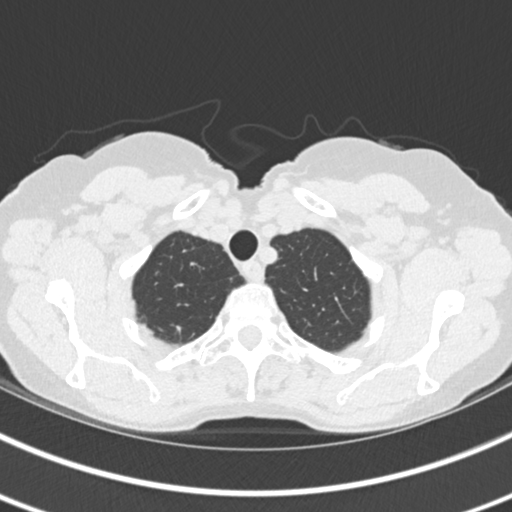
[im 147/159  lung]
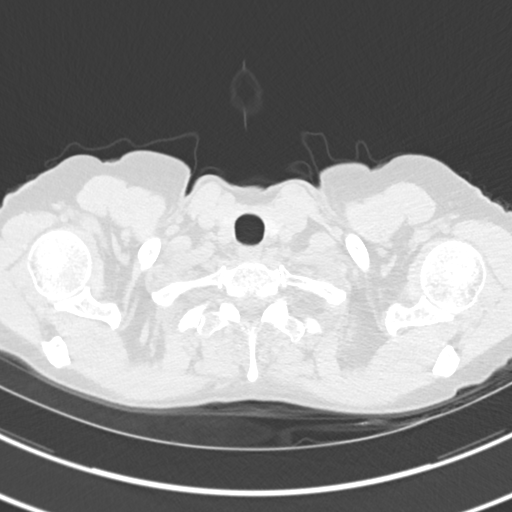

[15 of 34 positions shown; findings below may reference images not displayed]

FINDINGS: Cardiovascular: Atherosclerotic calcification of the aorta and
coronary arteries. Heart size normal. No pericardial effusion.

Mediastinum/Nodes: No pathologically enlarged mediastinal or
axillary lymph nodes. Hilar regions are difficult to definitively
evaluate without IV contrast. Esophagus is grossly unremarkable.

Lungs/Pleura: Biapical pleuroparenchymal scarring. Interval
progression in cylindrical and varicose bronchiectasis and
peribronchovascular nodularity. There is a dominant nodule in the
inferior right middle lobe, measuring 10 x 14 mm (3/99), new from
11/23/2016. No pleural fluid. Airway is otherwise unremarkable.

Upper Abdomen: Visualized portions of the liver, adrenal glands,
kidneys, spleen, pancreas, stomach and bowel are grossly
unremarkable.

Musculoskeletal: Degenerative changes in the spine. No worrisome
lytic or sclerotic lesions.
IMPRESSION: 1. Interval progression in bronchiectasis and peribronchovascular
nodularity, findings indicative of chronic mycobacterium avium
complex.
2. Dominant nodular lesion in the right middle lobe, new from
11/23/2016. This is strongly felt to be infectious/inflammatory but
malignancy cannot be definitively excluded. Recommend follow-up CT
chest without contrast in 2-3 months, as clinically indicated.
3. Aortic atherosclerosis ([id number].V). Coronary artery
calcification.

## 2021-12-22 ENCOUNTER — Ambulatory Visit: Payer: Medicare PPO | Admitting: Nurse Practitioner

## 2022-01-21 ENCOUNTER — Ambulatory Visit: Payer: Medicare PPO | Admitting: Pulmonary Disease

## 2022-01-21 ENCOUNTER — Other Ambulatory Visit: Payer: Self-pay

## 2022-01-21 ENCOUNTER — Encounter: Payer: Self-pay | Admitting: Pulmonary Disease

## 2022-01-21 VITALS — BP 118/72 | HR 77 | Temp 98.0°F | Ht 64.5 in | Wt 144.2 lb

## 2022-01-21 DIAGNOSIS — J479 Bronchiectasis, uncomplicated: Secondary | ICD-10-CM

## 2022-01-21 DIAGNOSIS — R918 Other nonspecific abnormal finding of lung field: Secondary | ICD-10-CM

## 2022-01-21 DIAGNOSIS — A310 Pulmonary mycobacterial infection: Secondary | ICD-10-CM | POA: Diagnosis not present

## 2022-01-21 NOTE — Patient Instructions (Signed)
Will arrange for CT chest at [facility] and call with results  Follow up in 3 months

## 2022-01-21 NOTE — Progress Notes (Signed)
[facility]  Chief Complaint  Patient presents with   Follow-up    F/U for bronchiectasis, states her cough has increased since last visit. Semi-productive cough with yellow phlegm.      Constitutional:  BP 118/72    Pulse 77    Temp 98 F (36.7 C) (Oral)    Ht 5' 4.5" (1.638 m)    Wt 144 lb 3.2 oz (65.4 kg)    LMP 12/14/2004    SpO2 98% Comment: on RA   BMI 24.37 kg/m   Past Medical History:  GERD, IBS, Osteopenia, Hyponatremia, Hypothyroidism  Past Surgical History:  She  has a past surgical history that includes Carpal tunnel release (2007); Video bronchoscopy (12/28/2012); Breast surgery (2/07); and Breast excisional biopsy.  Brief Summary:  Kim Vasquez is a 71 y.o. female former smoker with chronic cough, allergic asthma localized BTX, and MAI.        Subjective:   She tried spiriva, but wasn't able to tolerate this and didn't help with her breathing.  She has been feeling more fatigued.  She is getting more cough.  Bringing up phlegm that has more of a color now.  Not having fever, or hemoptysis.  Feels heavy in her chest.  Gets lots of phlegm up when after she takes hot shower.  Allergies and sinus okay at present.    Physical Exam:   Appearance - well kempt   ENMT - no sinus tenderness, no oral exudate, no LAN, Mallampati 3 airway, no stridor  Respiratory - equal breath sounds bilaterally, no wheezing or rales  CV - s1s2 regular rate and rhythm, no murmurs  Ext - no clubbing, no edema  Skin - no rashes  Psych - normal mood and affect     Pulmonary testing:  Bronchoscopy 05/29/08 >> scarring RML, cytology negative; MAI, Penicillium, and Aspergillus [place] in BAL Bronchoscopy 12/28/12 >> MAI in BAL, cytology negative PFT 03/01/13 >> FEV1 2.92 (136%), FEV1% 85, TLC 5.91 (124%), DLCO 117%, borderline BD Spirometry 02/15/17 >> FEV1 2.60 (102%), FEV1% 80 FeNO 04/08/17 >> 22 RAST 06/08/17 >> negative, IgE 49 FeNO  01/16/19 >> 64 Spirometry 01/16/19 >> FEV1 2.5 (105%), FEV1% 80 RAST 03/21/21 >> negative, IgE 85  Chest Imaging:  CT chest 02/08/08 >> RML tree in bud, mild BTX RML CT chest 12/22/12 >> RUL and RML tree in bud, BTX RML CT chest 04/23/15 >> BTX RUL/RML, nodular infiltrates RLL/RML increased in size CT chest 11/23/16 >> cylindrical BTX RUL/RML, scattered nodularity no change CT chest 05/20/21 >> biapical scarring, progression of BTX, 10 x 14 mm nodule RML PET scan 06/11/46 >> low metabolic activity in nodules  Cardiac Tests:  Echo 11/21/20 >> normal LV function, mild LVH  Social History:  She  reports that she quit smoking about 40 years ago. Her smoking use included cigarettes. She has a 14.00 pack-year smoking history. She has never used smokeless tobacco. She reports current alcohol use of about 7.0 - 14.0 standard drinks per week. She reports that she does not use drugs.  Family History:  Her family history includes Bipolar disorder in her maternal grandmother and sister; Breast cancer in her sister; Colon cancer in her maternal aunt; Diabetes in her paternal grandmother; Heart disease in an other family member; Hypertension in her father; Lymphoma in her mother; Osteoporosis in her paternal grandmother.     Assessment/Plan:   Allergic asthma and rhinitis. 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 - followed by Dr. Octavio Manns Dicks with Infectious Disease at [place] chest from June 2022 shows some progression with  waxing/waning nodules - she has progressive symptoms raising concern her MAI has progressed - will schedule CT chest without contrast at [facility] - she has follow up with Dr. Dorothyann Peng later this month and then will determine if she needs to consider therapy for MAI again - prn mucinex, flutter valve, nebulized saline  Intermittently tachycardia. - seen by cardiology at [facility]  Hyponatremia. - seen by nephrology at [facility]  Time Spent Involved in Patient Care on Day of Examination:  36 minutes  Follow up:   Patient Instructions  Will arrange for CT chest at [facility] and call with results  Follow up in 3 months  Medication List:   Allergies as of 01/21/2022       Reactions   Accolate [zafirlukast] Other (See Comments)   Heart started skipping beats   Cefdinir Itching   Itching eyes, hot rash all over body   Doxycycline Other (See Comments)   Reaction:  Stiff neck and headache    Erythromycin Other (See Comments)   Reaction:  Decreased urine output   Etodolac Other (See Comments)   Reaction:  Dizziness    Levofloxacin Other (See Comments)   Sore throat   Moxifloxacin Other (See Comments)   Reaction:  Stiff neck, dizziness, and headache   Bacitracin Itching, Other (See Comments)   Reaction:  Numbness    Neosporin [neomycin-bacitracin Zn-polymyx] Itching   Penicillins Itching, Rash, Other (See Comments)   Has patient had a PCN reaction causing immediate rash, facial/tongue/throat swelling, SOB or lightheadedness with hypotension: No Has patient had a PCN reaction causing severe rash involving mucus membranes or skin necrosis: No Has patient had a PCN reaction that required hospitalization No Has patient had a PCN reaction occurring within the last 10 years: No If all of the above answers are "NO", then may proceed with Cephalosporin use.   Polysporin [bacitracin-polymyxin B] Itching        Medication List        Accurate as of January 21, 2022  2:07 PM.  If you have any questions, ask your nurse or doctor.          STOP taking these medications    Flaxseed Oil 1000 MG Caps Stopped by: Chesley Mires, MD   Spiriva Respimat 2.5 MCG/ACT Aers Generic drug: Tiotropium Bromide Monohydrate Stopped by: Chesley Mires, MD       TAKE these medications    albuterol 108 (90 Base) MCG/ACT inhaler Commonly known as: ProAir HFA Inhale 2 puffs into the lungs every 6 (six) hours as needed for wheezing or shortness of breath.   alendronate 70 MG tablet Commonly known as: FOSAMAX Take by mouth as directed.   CALCIUM PO Take by mouth.   cholecalciferol 1000 units tablet Commonly known as: VITAMIN D Take 1,000 Units by mouth daily.   CITRULLINE PO Take by mouth. + argenine   Cranberry 200 MG Caps Take by mouth.   diphenhydrAMINE 25 MG  tablet Commonly known as: BENADRYL Take 25 mg by mouth every 6 (six) hours as needed for allergies.   EPINEPHrine 0.3 mg/0.3 mL Soaj injection Commonly known as: EPI-PEN   estradiol 0.1 MG/GM vaginal cream Commonly known as: ESTRACE Place 1 Applicatorful vaginally 3 (three) times a week. What changed:  when to take this additional instructions   famotidine 20 MG tablet Commonly known as: PEPCID Take 20 mg by mouth daily as needed for heartburn or indigestion.   levothyroxine 25 MCG tablet Commonly known as: SYNTHROID Take 37.5 mcg by mouth daily. What changed: Another medication with the same name was removed. Continue taking this medication, and follow the directions you see here. Changed by: Chesley Mires, MD   magnesium gluconate 500 MG tablet Commonly known as: MAGONATE Take 500 mg by mouth at bedtime.   vitamin C 500 MG tablet Commonly known as: ASCORBIC ACID Take 500 mg by mouth daily.        Signature:  Chesley Mires, MD [place] Pager - [phone] 01/21/2022, 2:07 PM

## 2022-02-04 ENCOUNTER — Other Ambulatory Visit: Payer: Self-pay

## 2022-02-04 ENCOUNTER — Ambulatory Visit
Admission: RE | Admit: 2022-02-04 | Discharge: 2022-02-04 | Disposition: A | Discharge status: Home / Self Care | Payer: Medicare PPO | Source: Ambulatory Visit | Attending: Pulmonary Disease | Admitting: Pulmonary Disease

## 2022-02-04 DIAGNOSIS — J479 Bronchiectasis, uncomplicated: Secondary | ICD-10-CM | POA: Insufficient documentation

## 2022-02-04 DIAGNOSIS — A310 Pulmonary mycobacterial infection: Secondary | ICD-10-CM | POA: Insufficient documentation

## 2022-02-04 DIAGNOSIS — R918 Other nonspecific abnormal finding of lung field: Secondary | ICD-10-CM | POA: Diagnosis present

## 2022-02-09 ENCOUNTER — Telehealth: Payer: Self-pay | Admitting: Pulmonary Disease

## 2022-02-09 DIAGNOSIS — K769 Liver disease, unspecified: Secondary | ICD-10-CM

## 2022-02-09 NOTE — Telephone Encounter (Signed)
CT chest 02/06/22 >> biapical scarring, scattered nodularity, bronchiectasis, mucoid impaction, volume loss with waxing/waning pattern compared to 05/20/21; also subcentimeter low attentuation lesion in dome of the liver   Results d/w pt over the phone.  Explained this is typical waxing/waning pattern from [place] without significant progression from scan in June 2022.  She had appointment with ID at [facility].  Decision made to defer therapy for MAI unless she has significant progression in MAI symptoms.  Explained that I agree this is a reasonable approach at this time.  She was also concerned about new report of sub-centimeter low attenuation lesion in the dome of her liver.  Explained this is likely a benign finding.  She read information online about this, and is still very concerned this could turn into something more serious.  Explained that we could schedule an MRI of her liver in 2 months.  At that point we should be able to tell if there is growth in the lesion, but it wouldn't change management options if we waited until then.  She is agreeable with this plan.

## 2022-03-16 ENCOUNTER — Ambulatory Visit
Admission: RE | Admit: 2022-03-16 | Discharge: 2022-03-16 | Disposition: A | Discharge status: Home / Self Care | Payer: Medicare PPO | Source: Ambulatory Visit | Attending: Pulmonary Disease | Admitting: Pulmonary Disease

## 2022-03-16 DIAGNOSIS — K769 Liver disease, unspecified: Secondary | ICD-10-CM | POA: Diagnosis present

## 2022-03-16 MED ORDER — GADOBUTROL 1 MMOL/ML IV SOLN
6.0000 mL | Freq: Once | INTRAVENOUS | Status: AC | PRN
  Administered 2022-03-16: 6 mL via INTRAVENOUS

## 2023-01-05 ENCOUNTER — Ambulatory Visit (HOSPITAL_BASED_OUTPATIENT_CLINIC_OR_DEPARTMENT_OTHER): Payer: Medicare PPO | Admitting: Pulmonary Disease

## 2023-02-15 ENCOUNTER — Ambulatory Visit (INDEPENDENT_AMBULATORY_CARE_PROVIDER_SITE_OTHER): Payer: Medicare PPO

## 2023-02-15 ENCOUNTER — Ambulatory Visit (HOSPITAL_BASED_OUTPATIENT_CLINIC_OR_DEPARTMENT_OTHER): Payer: Medicare PPO | Admitting: Pulmonary Disease

## 2023-02-15 ENCOUNTER — Encounter (HOSPITAL_BASED_OUTPATIENT_CLINIC_OR_DEPARTMENT_OTHER): Payer: Self-pay | Admitting: Pulmonary Disease

## 2023-02-15 VITALS — BP 128/64 | HR 81 | Ht 64.0 in | Wt 139.8 lb

## 2023-02-15 DIAGNOSIS — J479 Bronchiectasis, uncomplicated: Secondary | ICD-10-CM

## 2023-02-15 DIAGNOSIS — Z881 Allergy status to other antibiotic agents status: Secondary | ICD-10-CM

## 2023-02-15 DIAGNOSIS — Z8701 Personal history of pneumonia (recurrent): Secondary | ICD-10-CM

## 2023-02-15 NOTE — Patient Instructions (Signed)
Chest xray today  Will arrange for immunology referral to assess for antibiotic allergies  Follow up in 6 months

## 2023-02-15 NOTE — Progress Notes (Signed)
Kim Vasquez, Kim Vasquez, Kim Vasquez  No chief complaint on file.    Constitutional:  BP 128/64 (BP Location: Right Arm, Cuff Size: Normal)   Pulse 81   Ht '5\' 4"'$  (1.626 m)   Wt 139 lb 12.4 oz (63.4 kg)   LMP 12/14/2004   SpO2 98%   BMI 23.99 kg/m   Past Medical History:  GERD, IBS, Osteopenia, Hyponatremia, Hypothyroidism  Past Surgical History:  She  has a past surgical history that includes Carpal tunnel release (2007); Video bronchoscopy (12/28/2012); Breast surgery (2/07); Kim Breast excisional biopsy.  Brief Summary:  Kim Vasquez is a 71 y.o. female former smoker with chronic cough, allergic asthma localized BTX, Kim MAI.        Subjective:   She saw Dr. Dorothyann Vasquez in January.  She had COVID Kim then bacterial pneumonia in December 2023.  She was treated with zithromax.  Around that time she had urine retention Kim gained weight.  She was also started on atrovent in addition to albuterol.  Suspect urine retention likely from atrovent rather than zithromax.  Since then she has more cough with chest congestion.  Has been using mucinex, flutter valve, Kim hypertonic saline bid.  She has been getting tightness again in her Lt chest.  This happens when her asthma flares up.  Albuterol Kim atrovent help with this.  PFT from [place] was normal.  She is worried about antibiotic allergies Kim how this will limit future therapy.  Physical Exam:   Appearance - well kempt   ENMT - no sinus tenderness, no oral exudate, no LAN, Mallampati 3 airway, no stridor  Respiratory - equal breath sounds bilaterally, no wheezing or rales  CV - s1s2 regular rate Kim rhythm, no murmurs  Ext - no clubbing, no edema  Skin - no rashes  Psych - normal mood Kim affect     Vasquez testing:  Bronchoscopy 05/29/08 >> scarring RML, cytology negative; MAI, Penicillium, Kim Aspergillus [place] in BAL Bronchoscopy 12/28/12 >> MAI in BAL, cytology negative PFT 03/01/13 >>  FEV1 2.92 (136%), FEV1% 85, TLC 5.91 (124%), DLCO 117%, borderline BD Spirometry 02/15/17 >> FEV1 2.60 (102%), FEV1% 80 FeNO 04/08/17 >> 22 RAST 06/08/17 >> negative, IgE 49 FeNO 01/16/19 >> 64 Spirometry 01/16/19 >> FEV1 2.5 (105%), FEV1% 80 RAST 03/21/21 >> negative, IgE 85 PFT 12/22/22 >> FEV1 2.35 (107%), FEV1% 70, TLC 5.39 (108%), DLCO 93%  Chest Imaging:  CT chest 02/08/08 >> RML tree in bud, mild BTX RML CT chest 12/22/12 >> RUL Kim RML tree in bud, BTX RML CT chest 04/23/15 >> BTX RUL/RML, nodular infiltrates RLL/RML increased in size CT chest 11/23/16 >> cylindrical BTX RUL/RML, scattered nodularity no change CT chest 05/20/21 >> biapical scarring, progression of BTX, 10 x 14 mm nodule RML PET scan [id number] >> low metabolic activity in nodules  Cardiac Tests:  Echo 11/21/20 >> normal LV function, mild LVH  Social History:  She  reports that she quit smoking about 41 years ago. Her smoking use included cigarettes. She has a 14.00 pack-year smoking history. She has never used smokeless tobacco. She reports current alcohol use of about 7.0 - 14.0 standard drinks of alcohol per week. She reports that she does not use drugs.  Family History:  Her family history includes Bipolar disorder in her maternal grandmother Kim sister; Breast cancer in her sister; Colon cancer in her maternal aunt; Diabetes in her paternal grandmother; Heart disease in an other family member; Hypertension in her  father; Lymphoma in her mother; Osteoporosis in her paternal grandmother.     Assessment/Plan:   Allergic asthma Kim rhinitis. 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 - followed by Dr. Octavio Manns Dicks with Infectious Disease at [facility] chest from June 2022 shows some progression with waxing/waning nodules - she has progressive symptoms raising concern her MAI has progressed - will schedule CT chest without contrast at [facility] - she has follow up with Dr. Dorothyann Vasquez later this month Kim then will determine if she needs to consider therapy for MAI again - continue mucinex, flutter valve, hypertonic saline, albuterol - don't think she needs chest vest at this time  Antibiotic allergies. - will arrange for immunology referral to determine which antibiotics she is allergic to   History of COVID 19 Kim bacterial pneumonia. - from December 2023 - will repeat chest xray today  Intermittently tachycardia. - seen by cardiology at [facility]  Hyponatremia. - seen by nephrology at [facility]  Time Spent Involved in Patient Vasquez on Day of Examination:  38 minutes  Follow up:   Patient Instructions  Chest xray today  Will arrange for immunology referral to assess for antibiotic allergies  Follow up in 6 months  Medication List:   Allergies as of 02/15/2023       Reactions   Accolate [zafirlukast] Other (See Comments)   Heart started skipping beats   Cefdinir Itching   Itching eyes, hot rash all over body   Doxycycline Other (See Comments)   Reaction:  Stiff neck Kim headache    Erythromycin Other (See Comments)   Reaction:  Decreased urine output   Etodolac Other (See Comments)   Reaction:  Dizziness    Levofloxacin Other (See Comments)   Sore throat   Moxifloxacin Other (See Comments)   Reaction:  Stiff neck, dizziness, Kim headache   Bacitracin Itching, Other (See Comments)   Reaction:  Numbness    Neosporin  [neomycin-bacitracin Zn-polymyx] Itching   Penicillins Itching, Rash, Other (See Comments)   Has patient had a PCN reaction causing immediate rash, facial/tongue/throat swelling, SOB or lightheadedness with hypotension: No Has patient had a PCN reaction causing severe rash involving mucus membranes or skin necrosis: No Has patient had a PCN reaction that required hospitalization No Has patient had a PCN reaction occurring within the last 10 years: No If all of the above answers are "NO", then may proceed with Cephalosporin use.   Polysporin [bacitracin-polymyxin B] Itching        Medication List        Accurate as of February 15, 2023  2:31 PM. If you have any questions, ask your nurse or doctor.          albuterol 108 (90 Base) MCG/ACT inhaler Commonly known as: ProAir HFA Inhale 2 puffs into the lungs every 6 (six) hours as needed for wheezing or shortness of breath.   alendronate 70 MG tablet Commonly known as: FOSAMAX Take by mouth as directed.  ascorbic acid 500 MG tablet Commonly known as: VITAMIN C Take 500 mg by mouth daily.   Azelastine HCl 137 MCG/SPRAY Soln Place into both nostrils.   CALCIUM PO Take by mouth.   cholecalciferol 1000 units tablet Commonly known as: VITAMIN D Take 1,000 Units by mouth daily.   CITRULLINE PO Take by mouth. + argenine   Cranberry 200 MG Caps Take by mouth.   diphenhydrAMINE 25 MG tablet Commonly known as: BENADRYL Take 25 mg by mouth every 6 (six) hours as needed for allergies.   EPINEPHrine 0.3 mg/0.3 mL Soaj injection Commonly known as: EPI-PEN   estradiol 0.1 MG/GM vaginal cream Commonly known as: ESTRACE Place 1 Applicatorful vaginally 3 (three) times a week. What changed:  when to take this additional instructions   ezetimibe 10 MG tablet Commonly known as: ZETIA Take 1 tablet by mouth daily.   famotidine 20 MG tablet Commonly known as: PEPCID Take 20 mg by mouth daily as needed for heartburn or  indigestion.   ipratropium 17 MCG/ACT inhaler Commonly known as: ATROVENT HFA Nebulize once in the am , then in the evening  only as needed .   levothyroxine 25 MCG tablet Commonly known as: SYNTHROID Take 37.5 mcg by mouth daily.   magnesium gluconate 500 MG tablet Commonly known as: MAGONATE Take 500 mg by mouth at bedtime.        Signature:  Chesley Mires, MD [place] Pager - [phone] 02/15/2023, 2:31 PM

## 2023-04-13 ENCOUNTER — Encounter: Payer: Self-pay | Admitting: Internal Medicine

## 2023-04-13 ENCOUNTER — Ambulatory Visit: Payer: Medicare PPO | Admitting: Internal Medicine

## 2023-04-13 ENCOUNTER — Other Ambulatory Visit: Payer: Self-pay

## 2023-04-13 ENCOUNTER — Telehealth: Payer: Self-pay | Admitting: Internal Medicine

## 2023-04-13 VITALS — BP 120/86 | HR 81 | Temp 98.3°F | Resp 16 | Ht 64.75 in | Wt 143.2 lb

## 2023-04-13 DIAGNOSIS — L270 Generalized skin eruption due to drugs and medicaments taken internally: Secondary | ICD-10-CM | POA: Diagnosis not present

## 2023-04-13 DIAGNOSIS — J310 Chronic rhinitis: Secondary | ICD-10-CM

## 2023-04-13 DIAGNOSIS — Z889 Allergy status to unspecified drugs, medicaments and biological substances status: Secondary | ICD-10-CM

## 2023-04-13 DIAGNOSIS — Z880 Allergy status to penicillin: Secondary | ICD-10-CM

## 2023-04-13 DIAGNOSIS — T360X5A Adverse effect of penicillins, initial encounter: Secondary | ICD-10-CM

## 2023-04-13 MED ORDER — EPINEPHRINE 0.3 MG/0.3ML IJ SOAJ: 0.3000 mg | INTRAMUSCULAR | 1 refills | Status: AC | PRN

## 2023-04-13 NOTE — Telephone Encounter (Signed)
Patient has a Drug challenge with Dr.Patel Penicillin Challenge at 8:30 .

## 2023-04-13 NOTE — Patient Instructions (Addendum)
Chronic Rhinitis: - Use nasal saline rinses before nose sprays such as with Neilmed Sinus Rinse.  Use distilled water.  Use as needed. 0 - Use Azelastine 1-2 sprays each nostril twice daily as needed. Aim upward and outward.  Penicillin Allergy - Will do skin testing followed by oral challenge to Amoxicillin. - Please pick up the Amoxicillin and bring it with you the day of the test. - Hold all anti-histamines 3 days prior (benadryl, zyrtec, claritin, allegra, azelastine)  Follow up: 04/23/2023   8:30 AM overbook for penicillin test and challenge

## 2023-04-13 NOTE — Progress Notes (Signed)
NEW PATIENT  Date of Service/Encounter:  04/13/23  Consult requested by: Judie Bonus, MD   Subjective:   Kim Vasquez (DOB: 08-10-1951) is a 72 y.o. female who presents to the clinic on 04/13/2023 with a chief complaint of Establish Care (Pt states the is allergic to penicillin, Moxifloxacin, tetracycline, would like to be tested for antibiotics. Has a chronic lung infection./) .    History obtained from: chart review and patient.  She is followed by ID and Pulm for pulmonary MAI with bronchiectasis.  Also has asthma/allergic rhinitis, on albuterol/atrovent PRN.  Does not wish to use biologic agents due to her hx of lung infection and inhaled steroids due to hx of osteoporosis and MAI. She was referred to [place] for multiple antibiotic allergies.   Drug Reactions: Penicillin: resulted in itchy rash, around age 20.  No peeling or sloughing of skin.  Cefdinir: first course was fine, second course had red rash, lip numbness and it resolved after benadryl. No other symptoms.   Levofloxacin: dizziness and off balance, sore throat x2 episodes, took benadryl and it resolved   Tetracycline: bad headache  Neomycin: localized rash and itching/swelling after   Erythromycin: after a dental procedure, in the morning, had reduced urine output   Keeps an Epipen for these reactions.  Rhinitis:  Started years ago.  Symptoms include: nasal congestion, rhinorrhea, and post nasal drainage  Occurs year-round Potential triggers: not sure Treatments tried:  Azelastine; held it for 3 days  Previous allergy testing: yes previously; not interested in retesting History of reflux/heartburn:none History of sinus surgery: no Nonallergic triggers: none   Past Medical History: Past Medical History:  Diagnosis Date   Asthma    GERD (gastroesophageal reflux disease)    History of anemia after childbirth   IBS (irritable bowel syndrome)    Mycobacterium avium-intracellulare complex (HCC)     Osteopenia    Pulmonary infiltrate    Recurrent upper respiratory infection (URI)    Seasonal allergies    Past Surgical History: Past Surgical History:  Procedure Laterality Date   BREAST EXCISIONAL BIOPSY     BREAST SURGERY  2/07   left breast   CARPAL TUNNEL RELEASE  2007       VIDEO BRONCHOSCOPY  12/28/2012   Procedure: VIDEO BRONCHOSCOPY WITHOUT FLUORO;  Surgeon: Coralyn Helling, MD;  Location: [facility];  Service: Cardiopulmonary;  Laterality: Bilateral;    Family History: Family History  Problem Relation Age of Onset   Lymphoma Mother    Hypertension Father    Breast cancer Sister    Colon cancer Maternal Aunt    Bipolar disorder Maternal Grandmother    Diabetes Paternal Grandmother    Osteoporosis Paternal Grandmother    Heart disease Other        maternal grandparents    Medication List:  Allergies as of 04/13/2023       Reactions   Accolate [zafirlukast] Other (See Comments)   Heart started skipping beats   Cefdinir Itching   Itching eyes, hot rash all over body   Doxycycline Other (See Comments)   Reaction:  Stiff neck and headache    Erythromycin Other (See Comments)   Reaction:  Decreased urine output   Etodolac Other (See Comments)   Reaction:  Dizziness    Levofloxacin Other (See Comments)   Sore throat   Moxifloxacin Other (See Comments)   Reaction:  Stiff neck, dizziness, and headache   Bacitracin Itching, Other (See Comments)   Reaction:  Numbness  Neosporin [neomycin-bacitracin Zn-polymyx] Itching   Penicillins Itching, Rash, Other (See Comments)   Has patient had a PCN reaction causing immediate rash, facial/tongue/throat swelling, SOB or lightheadedness with hypotension: No Has patient had a PCN reaction causing severe rash involving mucus membranes or skin necrosis: No Has patient had a PCN reaction that required hospitalization No Has patient had a PCN reaction occurring within the last 10 years: No If all of the above answers are "NO",  then may proceed with Cephalosporin use.   Polysporin [bacitracin-polymyxin B] Itching        Medication List        Accurate as of April 13, 2023 12:17 PM. If you have any questions, ask your nurse or doctor.          STOP taking these medications    Cranberry 200 MG Caps Stopped by: Birder Robson, MD   ipratropium 17 MCG/ACT inhaler Commonly known as: ATROVENT HFA Stopped by: Birder Robson, MD       TAKE these medications    albuterol 108 (90 Base) MCG/ACT inhaler Commonly known as: ProAir HFA Inhale 2 puffs into the lungs every 6 (six) hours as needed for wheezing or shortness of breath.   alendronate 70 MG tablet Commonly known as: FOSAMAX Take by mouth as directed.   ascorbic acid 500 MG tablet Commonly known as: VITAMIN C Take 500 mg by mouth daily.   Azelastine HCl 137 MCG/SPRAY Soln Place into both nostrils.   CALCIUM PO Take by mouth.   cholecalciferol 1000 units tablet Commonly known as: VITAMIN D Take 1,000 Units by mouth daily.   CITRULLINE PO Take by mouth. + argenine   diphenhydrAMINE 25 MG tablet Commonly known as: BENADRYL Take 25 mg by mouth every 6 (six) hours as needed for allergies.   EPINEPHrine 0.3 mg/0.3 mL Soaj injection Commonly known as: EPI-PEN Inject 0.3 mg into the muscle as needed for anaphylaxis. What changed:  how much to take how to take this when to take this reasons to take this Changed by: Birder Robson, MD   estradiol 0.1 MG/GM vaginal cream Commonly known as: ESTRACE Place 1 Applicatorful vaginally 3 (three) times a week. What changed:  when to take this additional instructions   ezetimibe 10 MG tablet Commonly known as: ZETIA Take 1 tablet by mouth daily.   famotidine 20 MG tablet Commonly known as: PEPCID Take 20 mg by mouth daily as needed for heartburn or indigestion.   levothyroxine 25 MCG tablet Commonly known as: SYNTHROID Take 37.5 mcg by mouth daily.   magnesium gluconate 500 MG  tablet Commonly known as: MAGONATE Take 500 mg by mouth at bedtime.         REVIEW OF SYSTEMS: Pertinent positives and negatives discussed in HPI.   Objective:   Physical Exam: BP 120/86   Pulse 81   Temp 98.3 F (36.8 C)   Resp 16   Ht 5' 4.75" (1.645 m)   Wt 143 lb 3.2 oz (65 kg)   LMP 12/14/2004   SpO2 98%   BMI 24.01 kg/m  Body mass index is 24.01 kg/m. GEN: alert, well developed HEENT: clear conjunctiva, nose with + inferior turbinate hypertrophy, pink nasal mucosa, no rhinorrhea, + cobblestoning HEART: regular rate and rhythm, no murmur LUNGS: clear to auscultation bilaterally, no coughing, unlabored respiration ABDOMEN: soft, non distended  SKIN: no rashes or lesions  Reviewed:  02/15/2023: seen by Dr. Edward Qualia for MAI with regional bronchiectasis, asthma , antibiotic allergies.  Referred to Allergy for abx allergies. On PRN albuterol/atrovent. Did not tolerated Spiriva. Does not want ICS due to MAI/osteoporosis.  No interest in biologic due to hx of MAI.   12/17/2022: followed by ID [facility] Dr. 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 Plan is to continue to monitor for now.  12/24/2022: seen by ENT Dr. Cathie Hoops for hoarseness, post nasal drainage. On azelastine.    Assessment:   1. Chronic rhinitis   2. Dermatitis due to drug taken internally   3. Adverse effect of penicillin, initial encounter   4. Multiple drug allergies     Plan/Recommendations:   Chronic Rhinitis: - Use nasal saline rinses before nose sprays such as with Neilmed Sinus Rinse.  Use distilled water.  Use as needed.  - Use Azelastine 1-2 sprays each nostril twice daily as needed. Aim upward and outward. - Not interested in testing.   Penicillin Allergy - Will do skin testing followed by oral challenge to Amoxicillin. - Please pick up the Amoxicillin and bring it with you the day of the test. - Hold all anti-histamines 3 days prior (benadryl, zyrtec, claritin,  allegra, azelastine)  Other Antibiotic Allergy - In terms of levaquin, tetracycline, erythromycin reactions, do not sound like an IgE mediated reaction.  There is also no standardized testing available for these drugs.  It is a risk vs benefit discussion.  If the benefit for treatments outweighs the side effects, then would use the antibiotic but if she is worried about the reactions, then use an alternative.     Return in about 10 days (around 04/23/2023).  Alesia Morin, MD [facility]

## 2023-04-16 ENCOUNTER — Other Ambulatory Visit: Payer: Self-pay | Admitting: Internal Medicine

## 2023-04-16 MED ORDER — AMOXICILLIN 250 MG/5ML PO SUSR: 500.0000 mg | Freq: Every day | ORAL | 0 refills | Status: AC

## 2023-04-16 NOTE — Progress Notes (Signed)
Amoxicillin order placed for challenge.

## 2023-04-23 ENCOUNTER — Other Ambulatory Visit: Payer: Self-pay

## 2023-04-23 ENCOUNTER — Encounter: Payer: Self-pay | Admitting: Internal Medicine

## 2023-04-23 ENCOUNTER — Ambulatory Visit: Payer: Medicare PPO | Admitting: Internal Medicine

## 2023-04-23 VITALS — BP 120/74 | HR 77 | Temp 97.6°F | Resp 16 | Ht 64.75 in | Wt 142.8 lb

## 2023-04-23 DIAGNOSIS — Z880 Allergy status to penicillin: Secondary | ICD-10-CM | POA: Diagnosis not present

## 2023-04-23 DIAGNOSIS — T360X5D Adverse effect of penicillins, subsequent encounter: Secondary | ICD-10-CM

## 2023-04-23 DIAGNOSIS — L270 Generalized skin eruption due to drugs and medicaments taken internally: Secondary | ICD-10-CM

## 2023-04-23 NOTE — Progress Notes (Signed)
FOLLOW UP Date of Service/Encounter:  04/23/23   Subjective:  Kim Vasquez (DOB: 1951/04/22) is a 72 y.o. female who [facility] for follow up for penicillin skin testing and amoxicillin challenge.   History obtained from: chart review and patient.  Held all anti-histamines. No recent illness. Doing okay today.   Past Medical History: Past Medical History:  Diagnosis Date   Asthma    GERD (gastroesophageal reflux disease)    History of anemia after childbirth   IBS (irritable bowel syndrome)    Mycobacterium avium-intracellulare complex (HCC)    Osteopenia    Pulmonary infiltrate    Recurrent upper respiratory infection (URI)    Seasonal allergies     Objective:  BP 120/74   Pulse 77   Temp 97.6 F (36.4 C)   Resp 16   Ht 5' 4.75" (1.645 m)   Wt 142 lb 12.8 oz (64.8 kg)   LMP 12/14/2004   SpO2 100%   BMI 23.95 kg/m  Body mass index is 23.95 kg/m. Physical Exam: GEN: alert, well developed HEENT: clear conjunctiva, nose with moderate inferior turbinate hypertrophy, pink nasal mucosa, no rhinorrhea, no cobblestoning HEART: regular rate and rhythm, no murmur LUNGS: clear to auscultation bilaterally, no coughing, unlabored respiration SKIN: no rashes or lesions  Drug Skin Testing:  Skin prick testing was placed, which includes penicillin derivatives, histamine control, and saline control.  Verbal consent was obtained prior to placing test.  Patient tolerated procedure well.  Allergy testing results were read and interpreted by myself, documented by clinical staff. Adequate positive and negative control.  Positive results to:  Results discussed with patient/family.  Intradermal - 04/23/23 0900     Time Antigen Placed 0932    Number of Test 3    Control Negative    Other Negative   Pre Pen            Penicillin - 04/23/23 0900     Location Arm    Number of allergen test 4    Time Testing Placed 0910     Time Testing Placed 0930    Penicillin-G 5000 u/ml Intradermal Negative            Skin testing: 4 SPT Intradermal: 3 ID  Amoxicillin Challenge: Given 1cc (50mg ) followed by 9cc (450mg ) about 30 minutes after. Observed for 2 hours afterwards.  Start Time: 9:30  End Time: 12:35  Tolerated the challenge without reactions.   Post Physical Exam: GEN: alert, well developed HEENT: clear conjunctiva, moist mucous membranes  HEART: regular rate and rhythm, no murmur LUNGS: clear to auscultation bilaterally, no coughing, unlabored respiration SKIN: no rashes or lesions  Assessment:   1. Dermatitis due to drug taken internally   2. Adverse effect of penicillin, subsequent encounter     Plan/Recommendations:  Dermatitis due to Drug Allergy to Penicillin  Negative skin testing to penicillin and its components today. Tolerated 10mL of amoxicillin 250mg /28mL today.  For next 24 hours monitor for hives, swelling, shortness of breath and dizziness. If you see these symptoms, use Benadryl for mild symptoms and for more severe symptoms go to urgent care/ER or call 911.  Your risk of developing penicillin allergy in the future is the same as the general population's.  May take penicillin type of antibiotics in the future if needed.     Return if symptoms worsen or fail to improve.  Alesia Morin, MD [facility]

## 2023-04-23 NOTE — Patient Instructions (Addendum)
Negative skin testing to penicillin and its components today. Tolerated 10mL of amoxicillin 250mg /61mL today.  Your risk of developing penicillin allergy in the future is the same as the general population's.  May take penicillin type of antibiotics in the future if needed.

## 2023-04-23 NOTE — Addendum Note (Signed)
Addended by: Kellie Simmering, Tarik Teixeira on: 04/23/2023 05:09 PM   Modules accepted: Orders

## 2023-08-13 ENCOUNTER — Encounter (HOSPITAL_BASED_OUTPATIENT_CLINIC_OR_DEPARTMENT_OTHER): Payer: Self-pay | Admitting: Pulmonary Disease

## 2023-08-13 ENCOUNTER — Ambulatory Visit (HOSPITAL_BASED_OUTPATIENT_CLINIC_OR_DEPARTMENT_OTHER): Payer: Medicare PPO | Admitting: Pulmonary Disease

## 2023-08-13 VITALS — BP 118/80 | HR 72 | Resp 16 | Ht 64.75 in | Wt 142.6 lb

## 2023-08-13 DIAGNOSIS — J479 Bronchiectasis, uncomplicated: Secondary | ICD-10-CM

## 2023-08-13 DIAGNOSIS — A310 Pulmonary mycobacterial infection: Secondary | ICD-10-CM | POA: Diagnosis not present

## 2023-08-13 NOTE — Patient Instructions (Signed)
Follow up in 6 months 

## 2023-08-13 NOTE — Progress Notes (Signed)
[facility]  Chief Complaint  Patient presents with   Follow-up     Constitutional:  BP 118/80   Pulse 72   Resp 16   Ht 5' 4.75" (1.645 m)   Wt 142 lb 9.6 oz (64.7 kg)   LMP 12/14/2004   SpO2 98%   BMI 23.91 kg/m   Past Medical History:  GERD, IBS, Osteopenia, Hyponatremia, Hypothyroidism  Past Surgical History:  She  has a past surgical history that includes Carpal tunnel release (2007); Video bronchoscopy (12/28/2012); Breast surgery (2/07); and Breast excisional biopsy.  Brief Summary:  Kim Vasquez is a 72 y.o. female former smoker with chronic cough, allergic asthma localized BTX, and MAI.        Subjective:   She saw Dr. Evangeline Dakin in July.  Sputum culture from then had some growth of MAI and abscessum.  She feels more heaviness in her chest sometimes.  She needs to use albuterol before going to bed.  Uses atrovent intermittently.  No fever or hemoptysis.  Has more trouble with hot weather.  Physical Exam:   Appearance - well kempt   ENMT - no sinus tenderness, no oral exudate, no LAN, Mallampati 3 airway, no stridor  Respiratory - equal breath sounds bilaterally, no wheezing or rales  CV - s1s2 regular rate and rhythm, no murmurs  Ext - no clubbing, no edema  Skin - no rashes  Psych - normal mood and affect     Pulmonary testing:  Bronchoscopy 05/29/08 >> scarring RML, cytology negative; MAI, Penicillium, and Aspergillus [place] in BAL Bronchoscopy 12/28/12 >> MAI in BAL, cytology negative PFT 03/01/13 >> FEV1 2.92 (136%), FEV1% 85, TLC 5.91 (124%), DLCO 117%, borderline BD Spirometry 02/15/17 >> FEV1 2.60 (102%), FEV1% 80 FeNO 04/08/17 >> 22 RAST 06/08/17 >> negative, IgE 49 FeNO 01/16/19 >> 64 Spirometry 01/16/19 >> FEV1 2.5 (105%), FEV1% 80 RAST 03/21/21 >> negative, IgE 85 PFT 12/22/22 >> FEV1 2.35 (107%), FEV1% 70, TLC 5.39 (108%), DLCO 93%  Chest Imaging:  CT chest 02/08/08 >> RML tree in bud, mild BTX  RML CT chest 12/22/12 >> RUL and RML tree in bud, BTX RML CT chest 04/23/15 >> BTX RUL/RML, nodular infiltrates RLL/RML increased in size CT chest 11/23/16 >> cylindrical BTX RUL/RML, scattered nodularity no change CT chest 05/20/21 >> biapical scarring, progression of BTX, 10 x 14 mm nodule RML PET scan 06/12/21 >> low metabolic activity in nodules  Cardiac Tests:  Echo 11/21/20 >> normal LV function, mild LVH  Social History:  She  reports that she quit smoking about 41 years ago. Her smoking use included cigarettes. She started smoking about 55 years ago. She has a 14 pack-year smoking history. She has never been exposed to tobacco smoke. She has never used smokeless tobacco. She reports current alcohol use of about 7.0 - 14.0 standard drinks of alcohol per week. She reports that she does not use drugs.  Family History:  Her family history includes Bipolar disorder in her maternal grandmother; Breast cancer in her sister; Colon cancer in her maternal aunt; Diabetes in her paternal grandmother; Heart disease in an other family member; Hypertension in her father; Lymphoma in her mother; Osteoporosis in her paternal grandmother.     Assessment/Plan:   Allergic asthma and rhinitis. 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 - followed by Dr. Mont Dutton Dicks with Infectious Disease at [facility] - CT chest from June 2022 shows some  progression with waxing/waning nodules - chest xray from March 2024 was stable - continue mucinex, flutter valve, hypertonic saline, albuterol - don't think she needs chest vest at this time  Antibiotic allergies. - seen by Dr. Alesia Morin with [facility]  Intermittently tachycardia. - seen by cardiology at [facility]  Hyponatremia. - seen by nephrology at [facility]  Time Spent Involved in Patient Care on Day of Examination:  26 minutes  Follow up:   Patient Instructions  Follow up in 6 months  Medication List:   Allergies as of 08/13/2023       Reactions   Accolate [zafirlukast] Other (See Comments)   Heart started skipping beats   Cefdinir Itching   Itching eyes, hot rash all over body   Doxycycline Other (See Comments)   Reaction:  Stiff neck and headache    Erythromycin Other (See Comments)   Reaction:  Decreased urine output   Etodolac Other (See Comments)   Reaction:  Dizziness    Levofloxacin Other (See Comments)   Sore throat   Moxifloxacin Other (See Comments)   Reaction:  Stiff neck, dizziness, and headache   Bacitracin Itching, Other (See Comments)   Reaction:  Numbness    Neosporin [neomycin-bacitracin Zn-polymyx] Itching   Polysporin [bacitracin-polymyxin B] Itching        Medication List        Accurate as of August 13, 2023 12:02 PM. If you have any questions, ask your nurse or doctor.          STOP taking these medications    ezetimibe 10 MG tablet Commonly known as: ZETIA Stopped by: Coralyn Helling       TAKE these medications    albuterol 108 (90 Base) MCG/ACT inhaler Commonly known as: ProAir HFA Inhale 2 puffs into the lungs every 6 (six) hours as needed for wheezing or shortness of breath.   alendronate 70 MG tablet Commonly known as: FOSAMAX Take by mouth as directed.   ascorbic acid 500 MG tablet Commonly known as: VITAMIN C Take 500 mg by mouth daily.   Azelastine HCl 137 MCG/SPRAY Soln Place into both  nostrils.   CALCIUM PO Take by mouth.   cholecalciferol 1000 units tablet Commonly known as: VITAMIN D Take 1,000 Units by mouth daily.   CITRULLINE PO Take by mouth. + argenine   diphenhydrAMINE 25 MG tablet Commonly known as: BENADRYL Take 25 mg by mouth every 6 (six) hours as needed for allergies.   EPINEPHrine 0.3 mg/0.3 mL Soaj injection Commonly known as: EPI-PEN Inject 0.3 mg into the muscle as needed for anaphylaxis.   estradiol 0.1 MG/GM vaginal cream Commonly known as: ESTRACE Place 1 Applicatorful vaginally 3 (three) times a week. What changed:  when to take this additional instructions   famotidine 20 MG tablet Commonly known as: PEPCID Take 20 mg by mouth daily as needed for heartburn or indigestion.   levothyroxine 25 MCG tablet Commonly known as: SYNTHROID Take 37.5 mcg by mouth daily.   magnesium  gluconate 500 MG tablet Commonly known as: MAGONATE Take 500 mg by mouth at bedtime.        Signature:  Coralyn Helling, MD [facility] Pager - [phone] 08/13/2023, 12:02 PM
